# Patient Record
Sex: Female | Born: 1970 | Race: White | Hispanic: No | Marital: Single | Smoking: Former smoker
Health system: Southern US, Community
[De-identification: ages and names within clinical notes are randomized; demographics above are authoritative.]

## PROBLEM LIST (undated history)

## (undated) DIAGNOSIS — K219 Gastro-esophageal reflux disease without esophagitis: Secondary | ICD-10-CM

## (undated) DIAGNOSIS — G4730 Sleep apnea, unspecified: Secondary | ICD-10-CM

## (undated) DIAGNOSIS — F419 Anxiety disorder, unspecified: Secondary | ICD-10-CM

## (undated) DIAGNOSIS — Z85820 Personal history of malignant melanoma of skin: Secondary | ICD-10-CM

## (undated) DIAGNOSIS — F329 Major depressive disorder, single episode, unspecified: Secondary | ICD-10-CM

## (undated) DIAGNOSIS — N390 Urinary tract infection, site not specified: Secondary | ICD-10-CM

## (undated) DIAGNOSIS — F32A Depression, unspecified: Secondary | ICD-10-CM

## (undated) DIAGNOSIS — E041 Nontoxic single thyroid nodule: Secondary | ICD-10-CM

## (undated) DIAGNOSIS — J069 Acute upper respiratory infection, unspecified: Secondary | ICD-10-CM

## (undated) DIAGNOSIS — IMO0002 Reserved for concepts with insufficient information to code with codable children: Secondary | ICD-10-CM

## (undated) DIAGNOSIS — B019 Varicella without complication: Secondary | ICD-10-CM

## (undated) HISTORY — DX: Major depressive disorder, single episode, unspecified: F32.9

## (undated) HISTORY — DX: Personal history of malignant melanoma of skin: Z85.820

## (undated) HISTORY — DX: Varicella without complication: B01.9

## (undated) HISTORY — PX: NASAL SINUS SURGERY: SHX719

## (undated) HISTORY — DX: Depression, unspecified: F32.A

## (undated) HISTORY — DX: Gastro-esophageal reflux disease without esophagitis: K21.9

## (undated) HISTORY — DX: Reserved for concepts with insufficient information to code with codable children: IMO0002

## (undated) HISTORY — DX: Anxiety disorder, unspecified: F41.9

## (undated) HISTORY — DX: Nontoxic single thyroid nodule: E04.1

## (undated) HISTORY — DX: Sleep apnea, unspecified: G47.30

## (undated) HISTORY — DX: Acute upper respiratory infection, unspecified: J06.9

## (undated) HISTORY — DX: Urinary tract infection, site not specified: N39.0

---

## 2000-09-03 ENCOUNTER — Other Ambulatory Visit: Admission: RE | Admit: 2000-09-03 | Discharge: 2000-09-03 | Payer: Self-pay | Admitting: Internal Medicine

## 2001-10-28 ENCOUNTER — Other Ambulatory Visit: Admission: RE | Admit: 2001-10-28 | Discharge: 2001-10-28 | Payer: Self-pay | Admitting: Internal Medicine

## 2002-12-25 ENCOUNTER — Other Ambulatory Visit: Admission: RE | Admit: 2002-12-25 | Discharge: 2002-12-25 | Payer: Self-pay | Admitting: Internal Medicine

## 2003-12-30 ENCOUNTER — Other Ambulatory Visit: Admission: RE | Admit: 2003-12-30 | Discharge: 2003-12-30 | Payer: Self-pay | Admitting: Gynecology

## 2004-03-13 ENCOUNTER — Other Ambulatory Visit: Admission: RE | Admit: 2004-03-13 | Discharge: 2004-03-13 | Payer: Self-pay | Admitting: Gynecology

## 2004-08-28 ENCOUNTER — Other Ambulatory Visit: Admission: RE | Admit: 2004-08-28 | Discharge: 2004-08-28 | Payer: Self-pay | Admitting: Gynecology

## 2005-02-05 ENCOUNTER — Ambulatory Visit: Payer: Self-pay | Admitting: Internal Medicine

## 2005-03-14 ENCOUNTER — Other Ambulatory Visit: Admission: RE | Admit: 2005-03-14 | Discharge: 2005-03-14 | Payer: Self-pay | Admitting: Gynecology

## 2005-05-09 ENCOUNTER — Ambulatory Visit: Payer: Self-pay | Admitting: Cardiology

## 2005-09-18 ENCOUNTER — Ambulatory Visit: Payer: Self-pay | Admitting: Family Medicine

## 2005-09-24 HISTORY — PX: BREAST ENHANCEMENT SURGERY: SHX7

## 2006-04-01 ENCOUNTER — Other Ambulatory Visit: Admission: RE | Admit: 2006-04-01 | Discharge: 2006-04-01 | Payer: Self-pay | Admitting: Gynecology

## 2006-09-02 ENCOUNTER — Other Ambulatory Visit: Admission: RE | Admit: 2006-09-02 | Discharge: 2006-09-02 | Payer: Self-pay | Admitting: Gynecology

## 2007-02-04 ENCOUNTER — Other Ambulatory Visit: Admission: RE | Admit: 2007-02-04 | Discharge: 2007-02-04 | Payer: Self-pay | Admitting: Gynecology

## 2007-04-21 DIAGNOSIS — K219 Gastro-esophageal reflux disease without esophagitis: Secondary | ICD-10-CM | POA: Insufficient documentation

## 2007-07-04 ENCOUNTER — Other Ambulatory Visit: Admission: RE | Admit: 2007-07-04 | Discharge: 2007-07-04 | Payer: Self-pay | Admitting: Gynecology

## 2009-07-22 ENCOUNTER — Encounter: Admission: RE | Admit: 2009-07-22 | Discharge: 2009-07-22 | Payer: Self-pay | Admitting: Chiropractor

## 2009-07-28 ENCOUNTER — Encounter: Admission: RE | Admit: 2009-07-28 | Discharge: 2009-07-28 | Payer: Self-pay | Admitting: Chiropractor

## 2009-10-02 ENCOUNTER — Ambulatory Visit (HOSPITAL_BASED_OUTPATIENT_CLINIC_OR_DEPARTMENT_OTHER): Admission: RE | Admit: 2009-10-02 | Discharge: 2009-10-02 | Payer: Self-pay | Admitting: Otolaryngology

## 2009-10-15 ENCOUNTER — Ambulatory Visit: Payer: Self-pay | Admitting: Internal Medicine

## 2010-04-17 ENCOUNTER — Encounter: Admission: RE | Admit: 2010-04-17 | Discharge: 2010-04-17 | Payer: Self-pay | Admitting: Chiropractor

## 2010-04-20 ENCOUNTER — Encounter: Admission: RE | Admit: 2010-04-20 | Discharge: 2010-04-20 | Payer: Self-pay | Admitting: Chiropractor

## 2010-05-12 ENCOUNTER — Emergency Department (HOSPITAL_COMMUNITY): Admission: EM | Admit: 2010-05-12 | Discharge: 2010-05-12 | Payer: Self-pay | Admitting: Emergency Medicine

## 2010-05-12 ENCOUNTER — Encounter: Payer: Self-pay | Admitting: Cardiology

## 2010-05-12 ENCOUNTER — Telehealth: Payer: Self-pay | Admitting: Cardiology

## 2010-10-24 NOTE — Progress Notes (Signed)
Summary: chest pain/sob  Phone Note Call from Patient   Caller: Patient Reason for Call: Talk to Nurse Summary of Call: pt having chest pain and sob-wanted to see dr Honora Searson today-told he's not here-wants a call [phone] Initial call taken by: Glynda Jaeger,  May 12, 2010 11:57 AM  Follow-up for Phone Call        SPOKE WITH PT CHEST PAIN HAS LASTED 1 HOUR WITH NO ACTIVITY PER PT HAD EPISODE LAST WEEK AS WELL SIMILIAR S/S INSTRUCTED PT TO GO  TO  ER.VERBALIZED UNDERSTANDING.  Follow-up by: Scherrie Bateman, LPN,  May 12, 2010 12:23 PM  Additional Follow-up for Phone Call Additional follow up Details #1::        agree Additional Follow-up by: Gaylord Shih, MD, [facility],  May 15, 2010 10:46 AM

## 2010-10-24 NOTE — Letter (Signed)
Summary: ER Notification  Architectural technologist, Main Office  [address]   [place], [place] [address]   Phone: [phone]  Fax: [phone]    May 12, 2010 12:18 PM  Ashley Allen  The above referenced patient has been advised to report directly to the Emergency Room. Please see below for more information:  Dx: CHEST PAIN X 1 HOUR  WITH NO ACTIVITY______     Private Vehicle  _____X__________ or EMS:  ________________   Orders:  Yes ______ or No  __X_____   Notify upon arrival:     Trish [phone]       Or _________________   Thank you,    [facility] Staff

## 2010-12-08 LAB — POCT CARDIAC MARKERS
CKMB, poc: <1 ng/mL — ABNORMAL LOW (ref 1.0–8.0)
CKMB, poc: <1 ng/mL — ABNORMAL LOW (ref 1.0–8.0)
Myoglobin, poc: 20.3 ng/mL (ref 12–200)
Myoglobin, poc: 24.8 ng/mL (ref 12–200)
Troponin i, poc: <0.05 ng/mL (ref 0.00–0.09)
Troponin i, poc: <0.05 ng/mL (ref 0.00–0.09)

## 2010-12-08 LAB — BASIC METABOLIC PANEL
BUN: 8 mg/dL (ref 6–23)
CO2: 26 mEq/L (ref 19–32)
Calcium: 9.4 mg/dL (ref 8.4–10.5)
Chloride: 106 mEq/L (ref 96–112)
Creatinine, Ser: 0.56 mg/dL (ref 0.4–1.2)
GFR calc Af Amer: >60 mL/min (ref >60)
GFR calc non Af Amer: >60 mL/min (ref >60)
Glucose, Bld: 142 mg/dL — ABNORMAL HIGH (ref 70–99)
Potassium: 3.6 mEq/L (ref 3.5–5.1)
Sodium: 140 mEq/L (ref 135–145)

## 2010-12-08 LAB — CBC
HCT: 42.4 % (ref 36.0–46.0)
Hemoglobin: 14.7 g/dL (ref 12.0–15.0)
MCH: 31.1 pg (ref 26.0–34.0)
MCHC: 34.7 g/dL (ref 30.0–36.0)
MCV: 89.6 fL (ref 78.0–100.0)
Platelets: 214 10*3/uL (ref 150–400)
RBC: 4.73 MIL/uL (ref 3.87–5.11)
RDW: 12.3 % (ref 11.5–15.5)
WBC: 7.2 10*3/uL (ref 4.0–10.5)

## 2010-12-08 LAB — DIFFERENTIAL
Basophils Absolute: 0 10*3/uL (ref 0.0–0.1)
Basophils Relative: 1 % (ref 0–1)
Eosinophils Absolute: 0.1 10*3/uL (ref 0.0–0.7)
Eosinophils Relative: 1 % (ref 0–5)
Lymphocytes Relative: 31 % (ref 12–46)
Lymphs Abs: 2.3 10*3/uL (ref 0.7–4.0)
Monocytes Absolute: 0.6 10*3/uL (ref 0.1–1.0)
Monocytes Relative: 8 % (ref 3–12)
Neutro Abs: 4.3 10*3/uL (ref 1.7–7.7)
Neutrophils Relative %: 59 % (ref 43–77)

## 2010-12-08 LAB — D-DIMER, QUANTITATIVE: D-Dimer, Quant: <0.22 ug/mL-FEU (ref 0.00–0.48)

## 2011-01-03 ENCOUNTER — Other Ambulatory Visit: Payer: Self-pay | Admitting: Dermatology

## 2011-01-27 ENCOUNTER — Emergency Department (HOSPITAL_COMMUNITY)
Admission: EM | Admit: 2011-01-27 | Discharge: 2011-01-27 | Disposition: A | Discharge status: Home / Self Care | Payer: No Typology Code available for payment source | Attending: Emergency Medicine | Admitting: Emergency Medicine

## 2011-01-27 DIAGNOSIS — M545 Low back pain, unspecified: Secondary | ICD-10-CM | POA: Insufficient documentation

## 2011-01-27 DIAGNOSIS — K219 Gastro-esophageal reflux disease without esophagitis: Secondary | ICD-10-CM | POA: Insufficient documentation

## 2011-01-27 DIAGNOSIS — S139XXA Sprain of joints and ligaments of unspecified parts of neck, initial encounter: Secondary | ICD-10-CM | POA: Insufficient documentation

## 2011-01-27 DIAGNOSIS — S335XXA Sprain of ligaments of lumbar spine, initial encounter: Secondary | ICD-10-CM | POA: Insufficient documentation

## 2011-01-27 DIAGNOSIS — M542 Cervicalgia: Secondary | ICD-10-CM | POA: Insufficient documentation

## 2011-10-19 ENCOUNTER — Other Ambulatory Visit: Payer: Self-pay | Admitting: Gynecology

## 2011-10-30 ENCOUNTER — Other Ambulatory Visit: Payer: Self-pay | Admitting: Gynecology

## 2011-10-30 DIAGNOSIS — Z1231 Encounter for screening mammogram for malignant neoplasm of breast: Secondary | ICD-10-CM

## 2011-11-20 ENCOUNTER — Ambulatory Visit
Admission: RE | Admit: 2011-11-20 | Discharge: 2011-11-20 | Disposition: A | Discharge status: Home / Self Care | Payer: BC Managed Care – PPO | Source: Ambulatory Visit | Attending: Gynecology | Admitting: Gynecology

## 2011-11-20 DIAGNOSIS — Z1231 Encounter for screening mammogram for malignant neoplasm of breast: Secondary | ICD-10-CM

## 2012-01-21 ENCOUNTER — Other Ambulatory Visit: Payer: Self-pay | Admitting: Dermatology

## 2012-07-03 ENCOUNTER — Encounter: Payer: Self-pay | Admitting: Family Medicine

## 2012-07-03 ENCOUNTER — Ambulatory Visit (INDEPENDENT_AMBULATORY_CARE_PROVIDER_SITE_OTHER): Payer: BC Managed Care – PPO | Admitting: Family Medicine

## 2012-07-03 VITALS — BP 118/98 | Temp 98.2°F | Ht 65.0 in | Wt 149.0 lb

## 2012-07-03 DIAGNOSIS — E049 Nontoxic goiter, unspecified: Secondary | ICD-10-CM

## 2012-07-03 DIAGNOSIS — Z23 Encounter for immunization: Secondary | ICD-10-CM

## 2012-07-03 DIAGNOSIS — R5381 Other malaise: Secondary | ICD-10-CM

## 2012-07-03 DIAGNOSIS — R5383 Other fatigue: Secondary | ICD-10-CM

## 2012-07-03 LAB — COMPREHENSIVE METABOLIC PANEL
ALT: 15 U/L (ref 0–35)
AST: 17 U/L (ref 0–37)
Albumin: 3.8 g/dL (ref 3.5–5.2)
Alkaline Phosphatase: 45 U/L (ref 39–117)
BUN: 8 mg/dL (ref 6–23)
CO2: 26 mEq/L (ref 19–32)
Calcium: 9 mg/dL (ref 8.4–10.5)
Chloride: 103 mEq/L (ref 96–112)
Creatinine, Ser: 0.6 mg/dL (ref 0.4–1.2)
GFR: 119.92 mL/min (ref >60.00)
Glucose, Bld: 87 mg/dL (ref 70–99)
Potassium: 3.5 mEq/L (ref 3.5–5.1)
Sodium: 135 mEq/L (ref 135–145)
Total Bilirubin: 0.7 mg/dL (ref 0.3–1.2)
Total Protein: 7.2 g/dL (ref 6.0–8.3)

## 2012-07-03 LAB — LIPID PANEL
Cholesterol: 170 mg/dL (ref 0–200)
HDL: 48 mg/dL (ref >39.00)
LDL Cholesterol: 98 mg/dL (ref 0–99)
Total CHOL/HDL Ratio: 4
Triglycerides: 120 mg/dL (ref 0.0–149.0)
VLDL: 24 mg/dL (ref 0.0–40.0)

## 2012-07-03 LAB — CBC WITH DIFFERENTIAL/PLATELET
Basophils Absolute: 0 10*3/uL (ref 0.0–0.1)
Basophils Relative: 0.5 % (ref 0.0–3.0)
Eosinophils Absolute: 0.1 10*3/uL (ref 0.0–0.7)
Eosinophils Relative: 1.2 % (ref 0.0–5.0)
HCT: 41.4 % (ref 36.0–46.0)
Hemoglobin: 13.8 g/dL (ref 12.0–15.0)
Lymphocytes Relative: 35.7 % (ref 12.0–46.0)
Lymphs Abs: 2.2 10*3/uL (ref 0.7–4.0)
MCHC: 33.3 g/dL (ref 30.0–36.0)
MCV: 92.1 fl (ref 78.0–100.0)
Monocytes Absolute: 0.4 10*3/uL (ref 0.1–1.0)
Monocytes Relative: 5.9 % (ref 3.0–12.0)
Neutro Abs: 3.4 10*3/uL (ref 1.4–7.7)
Neutrophils Relative %: 56.7 % (ref 43.0–77.0)
Platelets: 272 10*3/uL (ref 150.0–400.0)
RBC: 4.49 Mil/uL (ref 3.87–5.11)
RDW: 13 % (ref 11.5–14.6)
WBC: 6.1 10*3/uL (ref 4.5–10.5)

## 2012-07-03 LAB — TSH: TSH: 0.39 u[IU]/mL (ref 0.35–5.50)

## 2012-07-03 LAB — HEMOGLOBIN A1C: Hgb A1c MFr Bld: 5.1 % (ref 4.6–6.5)

## 2012-07-03 NOTE — Patient Instructions (Signed)
-  We have ordered labs or studies at this visit. It usually takes 1-2 weeks for results and processing. We will contact you with instructions IF your results are abnormal. Normal results will be released to your [facility] in 1-2 weeks. If you have not heard from [place] or can not find your results in [facility] in 2 weeks please contact our office.    Thank you for enrolling in MyChart. Please follow the instructions below to securely access your online medical record. MyChart allows you to send messages to your doctor, view your test results, renew your prescriptions, schedule appointments, and more.  How Do I Sign Up? 1. In your Internet browser, go to [email or web address] 2. Click on the New  User? link in the Sign In box.  3. Enter your MyChart Access Code exactly as it appears below. You will not need to use this code after you have completed the sign-up process. If you do not sign up before the expiration date, you must request a new code. MyChart Access Code: [email or web address] Expires: 08/02/2012  3:45 PM  4. Enter the last four digits of your Social Security Number (xxxx) and Date of Birth (mm/dd/yyyy) as indicated and click Next. You will be taken to the next sign-up page. 5. Create a MyChart ID. This will be your MyChart login ID and cannot be changed, so think of one that is secure and easy to remember. 6. Create a MyChart password. You can change your password at any time. 7. Enter your Password Reset Question and Answer and click Next. This can be used at a later time if you forget your password.  8. Select your communication preference, and if applicable enter your e-mail address. You will receive e-mail notification when new information is available in MyChart by choosing to receive e-mail notifications and filling in your e-mail. 9. Click Sign In. You can now view your medical record.   Additional Information If you have questions, you can email [email or web address] or call [phone] to talk to our MyChart staff. Remember, MyChart is NOT to be used for urgent needs. For medical emergencies, dial 911.

## 2012-07-03 NOTE — Progress Notes (Addendum)
Chief Complaint  Patient presents with  . Establish Care    HPI:  Here to establish care and she is concerned she could have thyroid problems. For the last several months she feels like she has gained some weight, feels cold sometimes, and feels tired. Has FH of thyroid disease and a history of a goiter. She reports she saw an endocrinologist in the past and was on synthroid even though her thyroid levels were normal. She reports she stopped taking synthroid per her gyn doctor's recommendations. She would like to check labs today and then maybe see an endocrinologist here.  Denies: CP, SOB, skin changes, nausea, vomiting, diarrhea, palpitations, swelling  - has always had a little constipation on and off - normally has 2-3 bowel movements per week.  -Had an IUD and has been on birth control and since. Has some bleeding every month regularly - but variable in terms of flow. -has anxiety, was on SSRI in the past, but did not want to be on medications so stopped taking this  Per patient: -vaccines: wants flu vaccines, UTD on tetanus  Per review of chart: -pap 09/2011 normal -mammogram 10/2011 with implants, bi-rads 1 ROS: See pertinent positives and negatives per HPI.  Past Medical History  Diagnosis Date  . GERD (gastroesophageal reflux disease)   . Anxiety     No family history on file.  History   Social History  . Marital Status: Single    Spouse Name: N/A    Number of Children: N/A  . Years of Education: N/A   Social History Main Topics  . Smoking status: Former Games developer  . Smokeless tobacco: None   Comment: previous social smoker   . Alcohol Use: Yes  . Drug Use: None  . Sexually Active: None   Other Topics Concern  . None   Social History Narrative  . None    Current outpatient prescriptions:fluticasone (FLONASE) 50 MCG/ACT nasal spray, Place 2 sprays into the nose daily. , Disp: , Rfl: ;  LEVORA 0.15/30, 28, 0.15-30 MG-MCG tablet, Take 1 tablet by mouth daily. ,  Disp: , Rfl: ;  omeprazole (PRILOSEC) 40 MG capsule, Take 40 mg by mouth daily. , Disp: , Rfl: ;  valACYclovir (VALTREX) 1000 MG tablet, Take 1,000 mg by mouth daily. , Disp: , Rfl:   EXAM:  Filed Vitals:   07/03/12 1511  BP: 118/98  Temp: 98.2 F (36.8 C)    Body mass index is 24.79 kg/(m^2).  GENERAL: vitals reviewed and listed above, alert, oriented, appears well hydrated and in no acute distress  HEENT: atraumatic, conjunttiva clear, no obvious abnormalities on inspection of external nose and ears  NECK: no obvious masses on inspection  LUNGS: clear to auscultation bilaterally, no wheezes, rales or rhonchi, good air movement  CV: HRRR, no peripheral edema  MS: moves all extremities without noticeable abnormality  PSYCH: pleasant and cooperative, no obvious depression or anxiety  ASSESSMENT AND PLAN:  Discussed the following assessment and plan:  1. Fatigue  CBC with Differential, CMP, Lipid Panel, Hemoglobin A1c, Vitamin D, 25-hydroxy  2. Thyroid goiter  TSH   -reviewed PMH, FH, SH, Meds, All -many etiologies for fatigue - will check labs per orders and follow up in 1 month to discuss -advised counseling for anxiety   -flu today -Patient advised to return or notify a doctor immediately if symptoms worsen or persist or new concerns arise.  Patient Instructions  -We have ordered labs or studies at this visit. It usually  takes 1-2 weeks for results and processing. We will contact you with instructions IF your results are abnormal. Normal results will be released to your [facility] in 1-2 weeks. If you have not heard from [place] or can not find your results in [facility] in 2 weeks please contact our office.    Thank you for enrolling in MyChart. Please follow the instructions below to securely access your online medical record. MyChart allows you to send messages to your doctor, view your test results, renew your prescriptions, schedule appointments, and more.  How Do I Sign  Up? 1. In your Internet browser, go to [email or web address] 2. Click on the New  User? link in the Sign In box.  3. Enter your MyChart Access Code exactly as it appears below. You will not need to use this code after you have completed the sign-up process. If you do not sign up before the expiration date, you must request a new code. MyChart Access Code: [email or web address] Expires: 08/02/2012  3:45 PM  4. Enter the last four digits of your Social Security Number (xxxx) and Date of Birth (mm/dd/yyyy) as indicated and click Next. You will be taken to the next sign-up page. 5. Create a MyChart ID. This will be your MyChart login ID and cannot be changed, so think of one that is secure and easy to remember. 6. Create a MyChart password. You can change your password at any time. 7. Enter your Password Reset Question and Answer and click Next. This can be used at a later time if you forget your password.  8. Select your communication preference, and if applicable enter your e-mail address. You will receive e-mail notification when new information is available in MyChart by choosing to receive e-mail notifications and filling in your e-mail. 9. Click Sign In. You can now view your medical record.   Additional Information If you have questions, you can email [email or web address] or call [phone] to talk to our MyChart staff. Remember, MyChart is NOT to be used for urgent needs. For medical emergencies, dial 911.       Kriste Basque R.

## 2012-07-04 LAB — VITAMIN D 25 HYDROXY (VIT D DEFICIENCY, FRACTURES): Vit D, 25-Hydroxy: 50 ng/mL (ref 30–89)

## 2012-07-04 NOTE — Progress Notes (Signed)
Quick Note:  Left a message for return call. ______ 

## 2012-07-04 NOTE — Progress Notes (Signed)
Quick Note:  Called and spoke with pt and pt is aware. ______ 

## 2012-08-11 ENCOUNTER — Encounter: Payer: BC Managed Care – PPO | Admitting: Family Medicine

## 2012-08-11 NOTE — Progress Notes (Signed)
Pt no showed appt.  This encounter was created in error - please disregard.

## 2012-08-18 ENCOUNTER — Ambulatory Visit (INDEPENDENT_AMBULATORY_CARE_PROVIDER_SITE_OTHER): Payer: BC Managed Care – PPO | Admitting: Family Medicine

## 2012-08-18 ENCOUNTER — Encounter: Payer: Self-pay | Admitting: Family Medicine

## 2012-08-18 VITALS — BP 120/84 | HR 88 | Temp 98.4°F | Wt 147.0 lb

## 2012-08-18 DIAGNOSIS — F32A Depression, unspecified: Secondary | ICD-10-CM

## 2012-08-18 DIAGNOSIS — E039 Hypothyroidism, unspecified: Secondary | ICD-10-CM

## 2012-08-18 DIAGNOSIS — F329 Major depressive disorder, single episode, unspecified: Secondary | ICD-10-CM

## 2012-08-18 DIAGNOSIS — F3289 Other specified depressive episodes: Secondary | ICD-10-CM

## 2012-08-18 DIAGNOSIS — E041 Nontoxic single thyroid nodule: Secondary | ICD-10-CM

## 2012-08-18 MED ORDER — SERTRALINE HCL 50 MG PO TABS: 50.0000 mg | ORAL_TABLET | Freq: Every day | ORAL | Status: DC

## 2012-08-18 NOTE — Progress Notes (Signed)
Chief Complaint  Patient presents with  . 1 month follow up    HPI:  Follow up:  First seen a month ago and labs drawn for long hx of many vague complaints - fatigue, weight gain, occ constipation, cold sometimes at last appointment - hx of ? thyroid disease with normal thyroid levels, and ? Goiter, but had stopped taking synthroid - labs including TSH (on low end), lipids, CMP, CBC with diff, Hemoglobin A1c and vitamin D looked good -Denies: fevers, weight loss, GI symptoms other then mild occ constipation, GU symptoms, CP, SOB, NVD, UTD on health maintenance -she saw an endocrinologist prior to move -reports restarted zoloft at 50mg  daily - and feeling much better  ROS: See pertinent positives and negatives per HPI.  Past Medical History  Diagnosis Date  . GERD (gastroesophageal reflux disease)   . Anxiety   . Chicken pox   . Depression   . Ulcer   . UTI (urinary tract infection)     Family History  Problem Relation Age of Onset  . Alcohol abuse Father   . Arthritis Mother   . Arthritis Father   . Uterine cancer Other   . Colon cancer Other   . Hyperlipidemia Mother   . Heart disease Father   . Hypertension Mother   . Hypertension Father   . Sudden death Paternal Grandmother     History   Social History  . Marital Status: Single    Spouse Name: N/A    Number of Children: N/A  . Years of Education: N/A   Social History Main Topics  . Smoking status: Former Games developer  . Smokeless tobacco: None     Comment: previous social smoker   . Alcohol Use: Yes  . Drug Use: None  . Sexually Active: None   Other Topics Concern  . None   Social History Narrative  . None    Current outpatient prescriptions:fluticasone (FLONASE) 50 MCG/ACT nasal spray, Place 2 sprays into the nose daily. , Disp: , Rfl: ;  LEVORA 0.15/30, 28, 0.15-30 MG-MCG tablet, Take 1 tablet by mouth daily. , Disp: , Rfl: ;  omeprazole (PRILOSEC) 40 MG capsule, Take 40 mg by mouth daily. , Disp: ,  Rfl: ;  sertraline (ZOLOFT) 50 MG tablet, Take 1 tablet (50 mg total) by mouth daily., Disp: 90 tablet, Rfl: 3 valACYclovir (VALTREX) 1000 MG tablet, Take 1,000 mg by mouth daily. , Disp: , Rfl: ;  [DISCONTINUED] sertraline (ZOLOFT) 50 MG tablet, Take 50 mg by mouth daily. , Disp: , Rfl:   EXAM:  Filed Vitals:   08/18/12 1406  BP: 120/84  Pulse: 88  Temp: 98.4 F (36.9 C)    There is no height on file to calculate BMI.  GENERAL: vitals reviewed and listed above, alert, oriented, appears well hydrated and in no acute distress  HEENT: atraumatic, conjunttiva clear, no obvious abnormalities on inspection of external nose and ears  NECK: R thyroid fullness  LUNGS: clear to auscultation bilaterally, no wheezes, rales or rhonchi, good air movement  CV: HRRR, no peripheral edema  MS: moves all extremities without noticeable abnormality  PSYCH: pleasant and cooperative, no obvious depression or anxiety  ASSESSMENT AND PLAN:  Discussed the following assessment and plan:  1. Hypothyroid  Ambulatory referral to Endocrinology  2. Thyroid nodule  Ambulatory referral to Endocrinology  3. Depression  Ambulatory referral to Endocrinology, sertraline (ZOLOFT) 50 MG tablet   -Pt wonders if needs ot be on thyroid medication - on synthroid  in the past, but off for some time and TSH now in lower range. Discused options and pt would likeot see endocrinologist here for recs. She was followed by an endocrinologist prior to moving here who had recommended synthroid, then gyn here recommended she stop it. -discussed depression and symptoms improving on zoloft. Will stick to this dose per pt preference. Counseling recommended. Follow up in 2 months. -Patient advised to return or notify a doctor immediately if symptoms worsen or persist or new concerns arise.  There are no Patient Instructions on file for this visit.   Kriste Basque R.

## 2012-08-18 NOTE — Patient Instructions (Addendum)
-  continue zoloft  -Please see consider seeing your counselor

## 2012-08-25 ENCOUNTER — Ambulatory Visit: Payer: BC Managed Care – PPO | Admitting: Internal Medicine

## 2012-08-28 ENCOUNTER — Ambulatory Visit (INDEPENDENT_AMBULATORY_CARE_PROVIDER_SITE_OTHER): Payer: BC Managed Care – PPO | Admitting: Internal Medicine

## 2012-08-28 ENCOUNTER — Encounter: Payer: Self-pay | Admitting: Internal Medicine

## 2012-08-28 VITALS — BP 118/82 | HR 74 | Temp 98.4°F | Ht 65.0 in | Wt 149.0 lb

## 2012-08-28 DIAGNOSIS — R5381 Other malaise: Secondary | ICD-10-CM

## 2012-08-28 DIAGNOSIS — R5383 Other fatigue: Secondary | ICD-10-CM

## 2012-08-28 DIAGNOSIS — E039 Hypothyroidism, unspecified: Secondary | ICD-10-CM

## 2012-08-28 NOTE — Progress Notes (Signed)
Subjective:     Patient ID: Ashley Allen, female   DOB: 10/16/1970, 41 y.o.   MRN: [id number]  HPI Ms. Gossett is a 41 y/o woman referred by her PCP, Dr. Kriste Basque, for management of ? hypothyroidism, with recent normal TSH (0.39 on 07/03/2012 - LLN 0.35) despite being off Synthroid.  Pt tells me that she was started on Synthroid by her previous endocrinologist in 2008, for goiter, but she does not remember having blood work or thyroid U/S done, so it is unclear whether she had hypothyroidism or a thyroid nodule was palpated then. She took the medication for about 6 months then, then stopped the medication 5 years ago per [employer]. She did not feel different after starting or after stopping the medication. Of note, pt's mother and sister has Graves ds. She is wondering if she might develop it, too.   At her last visit with her PCP, in 08/18/2012, she had multiple vague complaints: fatigue, wt gain, constipation, feeling cold. She was wondering whether she needs to restart Synthroid and was referred to endocrinology.  She c/o fatigue accentuated in the last 6 months >> can sleep all day if she could. She asks me whether this can be related to possible celiac disease. She did not try a gluten-free diet to see if her fatigue improves (no h/o anemia, wt loss, diarrhea, rash). She also has constipation (1 BM per week, which is not new). She gained ~10 lbs in last year. She exercises by walking her dog 4 miles a day. She has more headaches than before, this week she had a headache every day. Menstrual cycles occur monthly. She had a panic attack in June 2011, none afterwards since starting Zoloft.   I reviewed her chart and she has a PMH of: - GERD, on PPI - anxiety - h/o basal cell cancer - she also was previously dx with OSA and started on CPAP, which she could not tolerate and stopped wearing it after 6 months. The snoring improved after she had sinus surgery, but she did not have a repeat Sleep study to  reevaluate the OSA after her surgery.  Her latest labs (07/03/2012) show: normal lipids, HbA1c 5.1%, vit D 50, CBC with diff normal, CMP normal.  Past Medical History  Diagnosis Date  . GERD (gastroesophageal reflux disease)   . Anxiety   . Chicken pox   . Depression   . Ulcer   . UTI (urinary tract infection)    Past Surgical History  Procedure Date  . Breast enhancement surgery 2007  . Nasal sinus surgery ?2010   History   Social History  . Marital Status: Single    Spouse Name: N/A    Number of Children: 0  . Years of Education: 25, college   Occupational History  . Sales [employer]   Social History Main Topics  . Smoking status: Former Games developer  . Smokeless tobacco: Not on file     Comment: previous social smoker   . Alcohol Use: Yes  . Drug Use: No  . Sexually Active: Not on file   Other Topics Concern  . Not on file   Social History Narrative   Regular exercise-yesCaffeine Use-yes    Current Outpatient Prescriptions on File Prior to Visit  Medication Sig Dispense Refill  . fluticasone (FLONASE) 50 MCG/ACT nasal spray Place 2 sprays into the nose daily.       Marland Kitchen LEVORA 0.15/30, 28, 0.15-30 MG-MCG tablet Take 1 tablet by mouth daily.       Marland Kitchen  sertraline (ZOLOFT) 50 MG tablet Take 1 tablet (50 mg total) by mouth daily.  90 tablet  3  . omeprazole (PRILOSEC) 40 MG capsule Take 40 mg by mouth daily as needed.       . valACYclovir (VALTREX) 1000 MG tablet Take 1,000 mg by mouth as needed.        Allergies  Allergen Reactions  . Polysporin (Bacitracin-Polymyxin B)   . Sulfonamide Derivatives     REACTION: Itching   Family History  Problem Relation Age of Onset  . Alcohol abuse Father   . Arthritis Mother   . Arthritis Father   . Uterine cancer Other   . Colon cancer Other   . Hyperlipidemia Mother   . Heart disease Father   . Hypertension Mother   . Hypertension Father   . Sudden death Paternal Grandmother    Review of Systems per  HPI+ Constitutional: has subjective hypothermia Eyes: no blurry vision, no xerophthalmia ENT: no sore throat, no nodules palpated in throat, no dysphagia/odynophagia, no hoarseness Cardiovascular: no CP/SOB/palpitations/leg swelling Respiratory: no cough/SOB Gastrointestinal: no N/V/D/C Musculoskeletal: no muscle/joint aches Skin: no rashes Neurological: no tremors/numbness/tingling/dizziness Psychiatric: some depression/anxiety  Objective:   Physical Exam BP 118/82  Pulse 74  Temp 98.4 F (36.9 C) (Oral)  Ht 5\' 5"  (1.651 m)  Wt 149 lb (67.586 kg)  BMI 24.79 kg/m2  SpO2 96%  LMP 08/05/2012 Constitutional: overweight, in NAD Eyes: PERRLA, EOMI, no exophthalmos ENT: moist mucous membranes, thyroid lobe R larger than the L, no clear nodule palpated, no cervical lymphadenopathy Cardiovascular: RRR, No MRG Respiratory: CTA B Gastrointestinal: abdomen soft, NT, ND, BS+ Musculoskeletal: no deformities, strength intact in all 4 Skin: moist, warm, no rashes Neurological: no tremor with outstretched hands, DTR normal in all 4     Assessment:     1. ? Hypothyroidism 2. Fatigue - vit D 50 - no anemia - no DM/prediabetes - good exercise level - OSA, not compliant with CPAP (cannot tolerate mask)    Plan:     1. ? Hypothyroidism - pt. has a h/o taking thyroid hormones likely to decrease the size of her thyroid as she remember being told by her endocrinologist that she has a goiter. I do not have records to see if she actually had hypothyroidism or a nodule at that time - she appears to be euthyroid per latest TSH, however this is on the low side - since both her mother and sister had Graves disease, she is definitely at risk for either Graves ds. or Hashimoto's thyroiditis or other autoimmune ds., for e.g. celiac disease - I do not feel a definite nodule on thyroid exam, but R lobe is a little larger than the left. I offered to get a thyroid U/S at this time, but pt is worried that  she will need to pay 40% of it and we decided to wait until next visit and reevaluate to see if there is growth or if she develops neck compression spx - for now, I will check TSH along with a free T3 and free T4, but also TSI and anti TPO Abs. If she has any of the 2, we will need to be more vigilant in rechecking her thyroid tests. Also, if she has thyroid autoimmunity, we will also need to be on the watch for any signs of adrenal insufficiency or other autoimmune ds. Also, even with normal tests, the titer of antibodies can vary and affect her hormone levels and spx in a transient  manner.  2. Fatigue - we discussed some possible causes: - vit D def - her vit D was normal - anemia - Hb was normal - celiac disease - we will check her for this by anti TTG antibody levels, but I explained that if these are negative, this does not mean that she does not have the ds and would still try a gluten-free diet to see how she feels on it - OSA - I believe this plays a large role in her fatigue, especially since it appears early in am. I advised her to check with Dr. Selena Batten if she needs to be reevaluated for this, and if she still needs CPAP, maybe to use nasal prongs or another device to help her tolerate it better - hypothyroidism - her TSH is low normal. If fT4 and fT3 are low, we will evaluate her for central hypothyroidism - diet - we discussed about how to make better choices in her diet to improve her energy level   Office Visit on 08/28/2012  Component Date Value Range Status  . TSH 08/28/2012 0.921  0.350 - 4.500 uIU/mL Final  . T3, Free 08/28/2012 2.8  2.3 - 4.2 pg/mL Final  . Free T4 08/28/2012 1.25  0.80 - 1.80 ng/dL Final  . TSI 86/57/8469 75  <140 % baseline Final  . Thyroid Peroxidase Antibody 08/28/2012 26.3  <35.0 IU/mL Final   Comment:                            The thyroid microsomal antigen has been shown to be Thyroid                          Peroxidase (TPO).  This assay detects anti-TPO  antibodies.  . Tissue Transglutaminase Ab, IgA 08/28/2012 4.6  <20 U/mL Final   Comment:    Reference Range:                                  <20     Negative                                  20-25   Equivocal                                  >25     Positive                                                     Between 2-3% of Celiac patients have selective IgA deficiency. If the                          tTG IgA result is negative but Celiac disease is still suspected,                          total IgA should be measured to identify possible selective IgA  deficiency and to rule out a false negative. In cases of IgA                          deficiency, measurement of tTG IgG should be considered.                             . Tissue Transglut Ab 08/28/2012 5.3  <20 U/mL Final   Comment:    Reference Range:                                  <20     Negative                                  20-25   Equivocal                                  >25     Positive                              Message sent to pt:  Dear Ms. Brien, Your TSI titer is finally back, I was waiting for it to return before I release your labs to MyChart. As you see, your thyroid hormone tests are normal, I do not think we need to recheck them more frequently than once a year. You can stay off Synthroid. Your thyroid peroxidase (TPO) antibody titer is not very high, but you do have some of these antibodies around, and it is possible that in periods of stress they can cause you to feel fatigued. The TSI antibodies are not significantly high, so no sign of Graves Disease. Regarding your celiac antibodies, these are normal, but, as we discussed, they can be normal and this does not completely rule out celiac disease. I would still try a gluten-free diet for at least 1-2 months to see how you feel. Please let me know if you have any questions. Sincerely, Carlus Pavlov MD

## 2012-08-28 NOTE — Patient Instructions (Addendum)
Please return in a year. Please discuss with Dr. Selena Batten about reevaluation for sleep apnea.

## 2012-08-29 LAB — TSH: TSH: 0.921 u[IU]/mL (ref 0.350–4.500)

## 2012-08-29 LAB — THYROID PEROXIDASE ANTIBODY: Thyroperoxidase Ab SerPl-aCnc: 26.3 IU/mL (ref <35.0)

## 2012-08-29 LAB — T4, FREE: Free T4: 1.25 ng/dL (ref 0.80–1.80)

## 2012-08-29 LAB — TISSUE TRANSGLUTAMINASE, IGG: Tissue Transglut Ab: 5.3 U/mL (ref <20)

## 2012-08-29 LAB — TISSUE TRANSGLUTAMINASE, IGA: Tissue Transglutaminase Ab, IgA: 4.6 U/mL (ref <20)

## 2012-08-29 LAB — T3, FREE: T3, Free: 2.8 pg/mL (ref 2.3–4.2)

## 2012-09-02 LAB — THYROID STIMULATING IMMUNOGLOBULIN: TSI: 75 % baseline (ref <140)

## 2012-10-01 ENCOUNTER — Ambulatory Visit (INDEPENDENT_AMBULATORY_CARE_PROVIDER_SITE_OTHER): Payer: BC Managed Care – PPO | Admitting: Family Medicine

## 2012-10-01 ENCOUNTER — Encounter: Payer: Self-pay | Admitting: Family Medicine

## 2012-10-01 VITALS — BP 130/88 | HR 98 | Temp 97.3°F | Wt 151.0 lb

## 2012-10-01 DIAGNOSIS — J069 Acute upper respiratory infection, unspecified: Secondary | ICD-10-CM

## 2012-10-01 DIAGNOSIS — R5381 Other malaise: Secondary | ICD-10-CM

## 2012-10-01 DIAGNOSIS — F32A Depression, unspecified: Secondary | ICD-10-CM

## 2012-10-01 DIAGNOSIS — G4733 Obstructive sleep apnea (adult) (pediatric): Secondary | ICD-10-CM | POA: Insufficient documentation

## 2012-10-01 DIAGNOSIS — F339 Major depressive disorder, recurrent, unspecified: Secondary | ICD-10-CM | POA: Insufficient documentation

## 2012-10-01 DIAGNOSIS — R5383 Other fatigue: Secondary | ICD-10-CM | POA: Insufficient documentation

## 2012-10-01 DIAGNOSIS — F329 Major depressive disorder, single episode, unspecified: Secondary | ICD-10-CM

## 2012-10-01 DIAGNOSIS — F3289 Other specified depressive episodes: Secondary | ICD-10-CM

## 2012-10-01 MED ORDER — GUAIFENESIN-CODEINE 100-10 MG/5ML PO SYRP: 5.0000 mL | ORAL_SOLUTION | Freq: Every evening | ORAL | Status: DC | PRN

## 2012-10-01 MED ORDER — BENZONATATE 100 MG PO CAPS: 100.0000 mg | ORAL_CAPSULE | Freq: Three times a day (TID) | ORAL | Status: DC | PRN

## 2012-10-01 MED ORDER — ALBUTEROL SULFATE HFA 108 (90 BASE) MCG/ACT IN AERS: 2.0000 | INHALATION_SPRAY | Freq: Four times a day (QID) | RESPIRATORY_TRACT | Status: DC | PRN

## 2012-10-01 MED ORDER — SERTRALINE HCL 50 MG PO TABS: 75.0000 mg | ORAL_TABLET | Freq: Every day | ORAL | Status: DC

## 2012-10-01 NOTE — Patient Instructions (Signed)
FOR YOUR COUGH: -use albuterol prn, cough medications, humidifier at night - if no improvement by next week start antibiotic  -We placed a referral for you as discussed to Pulmonology to discuss your sleep apnea and retest. It usually takes about 1-2 weeks to process and schedule this referral. If you have not heard from [place] regarding this appointment in 2 weeks please contact our office.

## 2012-10-01 NOTE — Progress Notes (Signed)
No chief complaint on file.   HPI: Acute visit for sinus congestion: -started: 1 week -symptoms:nasal congestion, sore throat, cough, SOB mild -denies:fever, SOB, NVD, tooth pain, strep or mono exposure, body aches -has tried: delsum, OTC meds, tylenol -sick contacts: "everybody", friend had the flu -Hx of: no allergies or asthma  Depression/Fatigue: -saw endo - extensive work up - notes and labs reviewed, no thyroid med recommended -pt with hx OSA, does have snoring and daytime somnolence - reports sleep study 3 or more years ago and mild OSA per her reports - did not tolerate CPAP. Would like to retests and see specialist to learn about options for tx if needed. -zoloft working initially, but not as well now. Poor sleep, depressed mood. No SI. Ha snot had counseling.   ROS: See pertinent positives and negatives per HPI.  Past Medical History  Diagnosis Date  . GERD (gastroesophageal reflux disease)   . Anxiety   . Chicken pox   . Depression   . Ulcer   . UTI (urinary tract infection)     Family History  Problem Relation Age of Onset  . Alcohol abuse Father   . Arthritis Mother   . Arthritis Father   . Uterine cancer Other   . Colon cancer Other   . Hyperlipidemia Mother   . Heart disease Father   . Hypertension Mother   . Hypertension Father   . Sudden death Paternal Grandmother     History   Social History  . Marital Status: Single    Spouse Name: N/A    Number of Children: 0  . Years of Education: 16   Occupational History  . Sales [employer]   Social History Main Topics  . Smoking status: Former Games developer  . Smokeless tobacco: None     Comment: previous social smoker   . Alcohol Use: Yes  . Drug Use: No  . Sexually Active: None   Other Topics Concern  . None   Social History Narrative   Regular exercise-yesCaffeine Use-yes    Current outpatient prescriptions:fluticasone (FLONASE) 50 MCG/ACT nasal spray, Place 2 sprays into the nose daily. ,  Disp: , Rfl: ;  LEVORA 0.15/30, 28, 0.15-30 MG-MCG tablet, Take 1 tablet by mouth daily. , Disp: , Rfl: ;  omeprazole (PRILOSEC) 40 MG capsule, Take 40 mg by mouth daily as needed. , Disp: , Rfl: ;  sertraline (ZOLOFT) 50 MG tablet, Take 1 tablet (50 mg total) by mouth daily., Disp: 90 tablet, Rfl: 3 valACYclovir (VALTREX) 1000 MG tablet, Take 1,000 mg by mouth as needed. , Disp: , Rfl: ;  albuterol (PROVENTIL HFA;VENTOLIN HFA) 108 (90 BASE) MCG/ACT inhaler, Inhale 2 puffs into the lungs every 6 (six) hours as needed for wheezing., Disp: 1 Inhaler, Rfl: 0;  benzonatate (TESSALON PERLES) 100 MG capsule, Take 1 capsule (100 mg total) by mouth 3 (three) times daily as needed for cough., Disp: 20 capsule, Rfl: 0 guaiFENesin-codeine (ROBITUSSIN AC) 100-10 MG/5ML syrup, Take 5 mLs by mouth at bedtime as needed for cough., Disp: 120 mL, Rfl: 0  EXAM:  Filed Vitals:   10/01/12 1029  BP: 130/88  Pulse: 98  Temp: 97.3 F (36.3 C)    There is no height on file to calculate BMI.  GENERAL: vitals reviewed and listed above, alert, oriented, appears well hydrated and in no acute distress  HEENT: atraumatic, conjunttiva clear, no obvious abnormalities on inspection of external nose and ears, normal appearance of ear canals and TMs, clear nasal congestion,  mild post oropharyngeal erythema with PND, no tonsillar edema or exudate, no sinus TTP  NECK: no obvious masses on inspection  LUNGS: clear to auscultation bilaterally, no wheezes, rales or rhonchi, good air movement  CV: HRRR, no peripheral edema  MS: moves all extremities without noticeable abnormality  PSYCH: pleasant and cooperative, no obvious depression or anxiety  ASSESSMENT AND PLAN:  Discussed the following assessment and plan:  1. Upper respiratory infection  Likely viral, lungs clear Supportive care, alb incase mild RAD - abx if not improving by next week - discussed risks of tx. Return precuations.Marland Kitchen albuterol (PROVENTIL HFA;VENTOLIN  HFA) 108 (90 BASE) MCG/ACT inhaler, benzonatate (TESSALON PERLES) 100 MG capsule, guaiFENesin-codeine (ROBITUSSIN AC) 100-10 MG/5ML syrup  2. OSA (obstructive sleep apnea)  Ambulatory referral to Pulmonology per pt request.  3. Depression  Advised counseling and increased zoloft to 75mg  daily - follow up in 1 month  4. Fatigue  Sleep test and tx depression.   -Patient advised to return or notify a doctor immediately if symptoms worsen or persist or new concerns arise.  Patient Instructions  FOR YOUR COUGH: -use albuterol prn, cough medications, humidifier at night - if no improvement by next week start antibiotic  -We placed a referral for you as discussed to Pulmonology to discuss your sleep apnea and retest. It usually takes about 1-2 weeks to process and schedule this referral. If you have not heard from [place] regarding this appointment in 2 weeks please contact our office.      Kriste Basque R.

## 2012-10-03 ENCOUNTER — Telehealth: Payer: Self-pay | Admitting: Family Medicine

## 2012-10-03 NOTE — Telephone Encounter (Signed)
Patient Information:  Caller Name: Derry  Phone: [phone]  Patient: Ashley Allen, Ashley Allen  Gender: Female  DOB: [birth year]  Age: 42 Years  PCP: Kriste Basque [facility])  Pregnant: No  Office Follow Up:  Does the office need to follow up with this patient?: No  Instructions For The Office: N/A  RN Note:  pt reports her pain is 5/10; Ibuprofen is not helping. RN advised patient to try Excedrin Migraine to see if that helped her headache.  Rn also advised lots of warm showers and warm fluids  Symptoms  Reason For Call & Symptoms: headache.  Pt states that she was seen in the office on 10/01/12,  Reviewed Health History In EMR: Yes  Reviewed Medications In EMR: Yes  Reviewed Allergies In EMR: Yes  Reviewed Surgeries / Procedures: Yes  Date of Onset of Symptoms: 09/26/2012  Treatments Tried: mucinex DM  Treatments Tried Worked: No OB / GYN:  LMP: 10/03/2012  Guideline(s) Used:  Headache  Disposition Per Guideline:   Home Care  Reason For Disposition Reached:   Mild-moderate headache  Advice Given:  Rest:   Lie down in a dark, quiet place and try to relax. Close your eyes and imagine your entire body relaxing.  Apply Cold to the Area:   Apply a cold wet washcloth or cold pack to the forehead for 20 minutes.  Call Back If:  Headache lasts longer than 24 hours  You become worse.  Pain Medicines:  For pain relief, you can take either acetaminophen, ibuprofen, or naproxen.  They are over-the-counter (OTC) pain drugs. You can buy them at the drugstore.

## 2012-10-06 ENCOUNTER — Other Ambulatory Visit: Payer: Self-pay | Admitting: Dermatology

## 2012-10-15 ENCOUNTER — Encounter: Payer: Self-pay | Admitting: Pulmonary Disease

## 2012-10-15 ENCOUNTER — Ambulatory Visit (INDEPENDENT_AMBULATORY_CARE_PROVIDER_SITE_OTHER): Payer: BC Managed Care – PPO | Admitting: Pulmonary Disease

## 2012-10-15 VITALS — BP 108/72 | HR 84 | Temp 98.4°F | Ht 65.0 in | Wt 148.4 lb

## 2012-10-15 DIAGNOSIS — G4733 Obstructive sleep apnea (adult) (pediatric): Secondary | ICD-10-CM

## 2012-10-15 NOTE — Progress Notes (Signed)
  Subjective:    Patient ID: Ashley Allen, female    DOB: 04-07-1971, 42 y.o.   MRN: [id number]  HPI The patient is a 42 year old female who I been asked to see for management of obstructive sleep apnea.  She was diagnosed in 2011 with mild OSA, with an AHI of 7 events per hour.  She was tried on CPAP, however could not tolerate because of having a large mask on her face.  She currently is continuing to have significant snoring, and has been noted to have an abnormal breathing pattern during sleep.  She has some awakenings during the night, and does not feel rested in the mornings upon arising.  She notes definite sleep pressured during the day with periods of an activity, and will often take a nap when able.  She also has sleepiness watching television or movies.  She denies any sleepiness while driving.  She has no symptoms suggestive of restless leg syndrome, as far she knows does not kick during the night.  The patient's weight is up 10 pounds over the last 2 years, and her Epworth score today is 10.  Sleep Questionnaire: What time do you typically go to bed?( Between what hours) 11p-12am How long does it take you to fall asleep? 33min-1hr How many times during the night do you wake up? 2 What time do you get out of bed to start your day? 0700 Do you drive or operate heavy machinery in your occupation? No How much has your weight changed (up or down) over the past two years? (In pounds) 10 lb (4.536 kg) Have you ever had a sleep study before? Yes If yes, location of study? [facility] If yes, date of study? 2001 Do you currently use CPAP? No Do you wear oxygen at any time? No    Review of Systems  Constitutional: Negative for fever and unexpected weight change.  HENT: Positive for congestion, rhinorrhea, sneezing, trouble swallowing, postnasal drip and sinus pressure. Negative for ear pain, nosebleeds, sore throat and dental problem.   Eyes: Negative for redness and itching.  Respiratory: Positive for  shortness of breath. Negative for cough, chest tightness and wheezing.   Cardiovascular: Negative for palpitations and leg swelling.  Gastrointestinal: Negative for nausea and vomiting.       Indigestion//Acid heartburn  Genitourinary: Negative for dysuria.  Musculoskeletal: Negative for joint swelling.  Skin: Negative for rash.  Neurological: Positive for headaches.  Hematological: Does not bruise/bleed easily.  Psychiatric/Behavioral: Positive for dysphoric mood. The patient is nervous/anxious.        Objective:   Physical Exam Constitutional:  Well developed, no acute distress  HENT:  Nares patent without discharge, but mild deviation to the right  Oropharynx without exudate, uvula is normal, palate elongated.   Eyes:  Perrla, eomi, no scleral icterus  Neck:  No JVD, no TMG  Cardiovascular:  Normal rate, regular rhythm, no rubs or gallops.  No murmurs        Intact distal pulses  Pulmonary :  Normal breath sounds, no stridor or respiratory distress   No rales, rhonchi, or wheezing  Abdominal:  Soft, nondistended, bowel sounds present.  No tenderness noted.   Musculoskeletal:  No lower extremity edema noted.  Lymph Nodes:  No cervical lymphadenopathy noted  Skin:  No cyanosis noted  Neurologic:  Alert, appropriate, moves all 4 extremities without obvious deficit.         Assessment & Plan:

## 2012-10-15 NOTE — Assessment & Plan Note (Signed)
The patient has been diagnosed with mild obstructive sleep apnea in the past, but could not tolerate CPAP because of the mask.  She is continued to have snoring, and abnormal breathing pattern during sleep, as well as nonrestorative sleep.  She also has sleep pressure during the day which interferes with her alertness and quality of life.  At this point, she is willing to try CPAP again with a different type of mask.  I also discussed with her the possibility of a dental appliance, as well as upper airway surgery.  She will talk to her orthodontist about a possible dental appliance, while we are working on c Pap trial

## 2012-10-15 NOTE — Patient Instructions (Addendum)
Will try cpap again with nasal pillows +/- chin strap Talk with your orthodontist about possibly trying a dental appliance for mild sleep apnea Work on modest weight loss.  followup with me in 6 weeks if you stay on cpap, but call if you are having tolerance issues so we can discuss/troubleshoot.

## 2012-10-17 ENCOUNTER — Ambulatory Visit (INDEPENDENT_AMBULATORY_CARE_PROVIDER_SITE_OTHER): Payer: BC Managed Care – PPO | Admitting: Licensed Clinical Social Worker

## 2012-10-17 DIAGNOSIS — F411 Generalized anxiety disorder: Secondary | ICD-10-CM

## 2012-10-17 DIAGNOSIS — F331 Major depressive disorder, recurrent, moderate: Secondary | ICD-10-CM

## 2012-10-28 ENCOUNTER — Ambulatory Visit (INDEPENDENT_AMBULATORY_CARE_PROVIDER_SITE_OTHER): Payer: BC Managed Care – PPO | Admitting: Licensed Clinical Social Worker

## 2012-10-28 DIAGNOSIS — F331 Major depressive disorder, recurrent, moderate: Secondary | ICD-10-CM

## 2012-10-28 DIAGNOSIS — F411 Generalized anxiety disorder: Secondary | ICD-10-CM

## 2012-10-30 ENCOUNTER — Ambulatory Visit (INDEPENDENT_AMBULATORY_CARE_PROVIDER_SITE_OTHER): Payer: BC Managed Care – PPO | Admitting: Family Medicine

## 2012-10-30 ENCOUNTER — Encounter: Payer: Self-pay | Admitting: Family Medicine

## 2012-10-30 VITALS — BP 132/88 | HR 80 | Temp 98.3°F | Wt 147.0 lb

## 2012-10-30 DIAGNOSIS — B009 Herpesviral infection, unspecified: Secondary | ICD-10-CM

## 2012-10-30 DIAGNOSIS — Z23 Encounter for immunization: Secondary | ICD-10-CM

## 2012-10-30 DIAGNOSIS — F3289 Other specified depressive episodes: Secondary | ICD-10-CM

## 2012-10-30 DIAGNOSIS — F32A Depression, unspecified: Secondary | ICD-10-CM

## 2012-10-30 DIAGNOSIS — B001 Herpesviral vesicular dermatitis: Secondary | ICD-10-CM

## 2012-10-30 DIAGNOSIS — Z309 Encounter for contraceptive management, unspecified: Secondary | ICD-10-CM

## 2012-10-30 DIAGNOSIS — F329 Major depressive disorder, single episode, unspecified: Secondary | ICD-10-CM

## 2012-10-30 DIAGNOSIS — IMO0001 Reserved for inherently not codable concepts without codable children: Secondary | ICD-10-CM

## 2012-10-30 DIAGNOSIS — G4733 Obstructive sleep apnea (adult) (pediatric): Secondary | ICD-10-CM

## 2012-10-30 MED ORDER — VALACYCLOVIR HCL 1 G PO TABS: 1000.0000 mg | ORAL_TABLET | ORAL | Status: DC | PRN

## 2012-10-30 MED ORDER — LEVONORGESTREL-ETHINYL ESTRAD 0.15-30 MG-MCG PO TABS: 1.0000 | ORAL_TABLET | Freq: Every day | ORAL | Status: DC

## 2012-10-30 MED ORDER — SERTRALINE HCL 25 MG PO TABS: ORAL_TABLET | ORAL | Status: DC

## 2012-10-30 NOTE — Patient Instructions (Addendum)
Contact your dentist about sleep apnea devices or consider this device: Contact The aveoTSD is distributed by [employer] in [place], [place], [place]. Hours 9AM to 4PM - Monday to Friday, PST. [employer] [address] [place] [address] [place] Phone: [phone]  Taper off the zoloft per instructions:  Start exercising on a regular basic  Continue counseling  Follow up in 3 months

## 2012-10-30 NOTE — Progress Notes (Signed)
Chief Complaint  Patient presents with  . 1 month follow up    HPI:  Here for Follow up/CPE:  1) Depression: fatigue, extensive labs looked good, has seen Dr. Lafe Garin in endo for her concern for subclinical thyroid dz and Dr. Shelle Iron in [facility] for ? OSA contributing  -zoloft help initially, increased dose last visit to 75mg , advised counseling and exercise -with increased zoloft she got a rash on trunk -not running  2)OSA:  -reviewed OV notes from Dr. Shelle Iron - recs appreciated -mild OSA, did not tolerate CPAP in the past - per review of notes is doing trial of CPAP with new mask and going to talk with dentist about dental appliance if this is not tolerated -she reports she does not want to use the CPAP, is going to check with dental devices  -Diet: variety of foods, balance and well rounded  -Taking folic acid: yes in multivitamin  -Exercise: no regular exercise  -Diabetes and Dyslipidemia Screening: normal 06/2012  -Hx of HTN: no  -Vaccines: UTD except tdap - wants this   -pap history: 1/13 normal  -FDLMP: 1 month ago  -sexual activity: yes, female partner, no new partners  -wants STI testing: no  -FH breast, colon or ovarian ca: see FH -mammogram: 10/2011 normal  -Alcohol, Tobacco, drug use: see social history  Review of Systems - see above  Past Medical History  Diagnosis Date  . GERD (gastroesophageal reflux disease)   . Anxiety   . Chicken pox   . Depression   . Ulcer   . UTI (urinary tract infection)     Family History  Problem Relation Age of Onset  . Alcohol abuse Father   . Arthritis Mother   . Arthritis Father   . Uterine cancer Other   . Colon cancer Other   . Hyperlipidemia Mother   . Heart disease Father   . Hypertension Mother   . Hypertension Father   . Sudden death Paternal Grandmother   . Cancer Other     Aunts  . Emphysema Father     heavy smoker    History   Social History  . Marital Status: Single    Spouse Name: N/A   Number of Children: 0  . Years of Education: 16   Occupational History  . [employer]   Social History Main Topics  . Smoking status: Former Smoker -- 3 years    Types: Cigarettes  . Smokeless tobacco: None     Comment: previous social smoker   . Alcohol Use: Yes  . Drug Use: No  . Sexually Active: None   Other Topics Concern  . None   Social History Narrative   Regular exercise-yesCaffeine Use-yes    Current outpatient prescriptions:fluticasone (FLONASE) 50 MCG/ACT nasal spray, Place 2 sprays into the nose daily. , Disp: , Rfl: ;  levonorgestrel-ethinyl estradiol (LEVORA 0.15/30, 28,) 0.15-30 MG-MCG tablet, Take 1 tablet by mouth daily., Disp: 1 Package, Rfl: 11;  Multiple Vitamin (MULTIVITAMIN) tablet, Take 1 tablet by mouth daily., Disp: , Rfl:  valACYclovir (VALTREX) 1000 MG tablet, Take 1 tablet (1,000 mg total) by mouth as needed., Disp: 10 tablet, Rfl: 0;  omeprazole (PRILOSEC) 40 MG capsule, Take 40 mg by mouth daily as needed. , Disp: , Rfl: ;  sertraline (ZOLOFT) 25 MG tablet, Take 50mg  daily for 1 week, then 25mg  daily for 1 week, then 25 mg every other day for 1 week then stop, Disp: 25 tablet, Rfl: 0  EXAM:  Filed  Vitals:   10/30/12 1611  BP: 132/88  Pulse: 80  Temp: 98.3 F (36.8 C)    GENERAL: vitals reviewed and listed below, alert, oriented, appears well hydrated and in no acute distress  HEENT: head atraumatic, PERRLA, normal appearance of eyes, ears, nose and mouth. moist mucus membranes.  NECK: supple, no masses or lymphadenopathy  LUNGS: clear to auscultation bilaterally, no rales, rhonchi or wheeze  CV: HRRR, no peripheral edema or cyanosis, normal pedal pulses  SKIN: erythematous papular rash on trunk  MS: normal gait, moves all extremities normally  PSYCH: normal affect, pleasant and cooperative  ASSESSMENT AND PLAN:  Discussed the following assessment and plan:  1. Depression  sertraline (ZOLOFT) 25 MG tablet  2. Recurrent cold  sores  valACYclovir (VALTREX) 1000 MG tablet  3. OSA (obstructive sleep apnea)    4. Contraception  levonorgestrel-ethinyl estradiol (LEVORA 0.15/30, 28,) 0.15-30 MG-MCG tablet    -healthy 42 yo Female with the following health issues:  -Discussed and advised all [place] preventive services health task force level A and B recommendations for age, sex and risks.  -advised taper of zoloft due to rash, continue counseling - discussed other options such as wellbutrin and TCA - she will consider, regular exercise, follow up in 3 months  -refilled OCP and Valtrex  -for OSA advised she talk with dentist and provided Rx and info for Aveo if she wants to try this  -Advised at least 150 minutes of exercise per week and a healthy diet low in saturated fats and sweets and consisting of fresh fruits and vegetables, lean meats such as fish and white chicken and whole grains.  -labs, studies and vaccines per orders this encounter  Orders Placed This Encounter  Procedures  . Tdap vaccine greater than or equal to 7yo IM    Patient Instructions  Contact your dentist about sleep apnea devices or consider this device: Contact The aveoTSD is distributed by [employer] in [place], [place], [place]. Hours 9AM to 4PM - Monday to Friday, PST. [employer] [address] [place] [address] [place] Phone: [phone]  Taper off the zoloft per instructions:  Start exercising on a regular basic  Continue counseling  Follow up in 3 months      Return in about 3 months (around 01/27/2013) for depression, sleep apnea.  Kriste Basque R.

## 2012-11-03 ENCOUNTER — Ambulatory Visit: Payer: BC Managed Care – PPO | Admitting: Family Medicine

## 2012-11-13 ENCOUNTER — Other Ambulatory Visit: Payer: Self-pay | Admitting: Dermatology

## 2012-11-21 ENCOUNTER — Ambulatory Visit: Payer: BC Managed Care – PPO | Admitting: Pulmonary Disease

## 2012-12-13 ENCOUNTER — Encounter: Payer: Self-pay | Admitting: Family Medicine

## 2012-12-15 NOTE — Telephone Encounter (Signed)
Pls advise.  

## 2012-12-16 ENCOUNTER — Telehealth: Payer: Self-pay | Admitting: Family Medicine

## 2012-12-16 MED ORDER — BUPROPION HCL ER (SR) 150 MG PO TB12: 150.0000 mg | ORAL_TABLET | Freq: Two times a day (BID) | ORAL | Status: DC

## 2012-12-16 NOTE — Telephone Encounter (Signed)
Please call and let her know ok to start wellbutrin per our discussion of the medication at her appt. Discussed risks then. Start with 150 once per day in the morning, then can increase in one week to 150 bid. Needs follow up appointment in 1 month or sooner if worsening depression or concerns.

## 2012-12-16 NOTE — Telephone Encounter (Signed)
Called and spoke with pt and pt is aware.  

## 2012-12-16 NOTE — Telephone Encounter (Signed)
pls advise

## 2012-12-16 NOTE — Telephone Encounter (Signed)
Patient is calling states that at last visit it was discussed that patient taper off of her Zoloft.  Patient was informed that if she felt like she still needed a medication to call back and notify Dr. Selena Batten and Wellbutrin would be called in.   Patient states that she is still having focusing problems, not sleeping well, and is having some anxiety.  [facility] [address]  OFFICE NOTE: PLEASE FOLLOW UP WITH SCRIPT REQUEST FROM PATIENT.

## 2013-01-29 ENCOUNTER — Ambulatory Visit: Payer: BC Managed Care – PPO | Admitting: Family Medicine

## 2013-02-13 ENCOUNTER — Encounter: Payer: Self-pay | Admitting: Family Medicine

## 2013-02-13 NOTE — Progress Notes (Signed)
Received some notes and results from Dr. Nicholas Lose. Last mammo 10/2011 Bi-rads 1 Last dexa 2008 normal Last pap1/2013 normal, hx abnormal pap 2008-2009, normal since Labs 09/2011 with HgbA1 c 5.5, lipids, CBC, CMP ok  Records scanned in.

## 2013-02-23 ENCOUNTER — Encounter: Payer: Self-pay | Admitting: Family Medicine

## 2013-02-23 ENCOUNTER — Encounter: Payer: Self-pay | Admitting: Gynecology

## 2013-03-01 DIAGNOSIS — M545 Low back pain, unspecified: Secondary | ICD-10-CM | POA: Insufficient documentation

## 2013-05-07 ENCOUNTER — Other Ambulatory Visit: Payer: Self-pay | Admitting: Family Medicine

## 2013-05-07 NOTE — Telephone Encounter (Signed)
Ok to refill once, looks like needs follow up appt. For other chronic issues.

## 2013-05-07 NOTE — Telephone Encounter (Signed)
Per Dr. Selena Batten called and spoke with pt and pt is aware.  Advised that pt should follow up;

## 2013-05-11 ENCOUNTER — Other Ambulatory Visit (HOSPITAL_COMMUNITY)
Admission: RE | Admit: 2013-05-11 | Discharge: 2013-05-11 | Disposition: A | Discharge status: Home / Self Care | Payer: PRIVATE HEALTH INSURANCE | Source: Ambulatory Visit | Attending: Family Medicine | Admitting: Family Medicine

## 2013-05-11 ENCOUNTER — Encounter: Payer: Self-pay | Admitting: Family Medicine

## 2013-05-11 ENCOUNTER — Ambulatory Visit (INDEPENDENT_AMBULATORY_CARE_PROVIDER_SITE_OTHER): Payer: PRIVATE HEALTH INSURANCE | Admitting: Family Medicine

## 2013-05-11 VITALS — BP 120/88 | HR 72 | Temp 98.5°F | Wt 146.0 lb

## 2013-05-11 DIAGNOSIS — F32A Depression, unspecified: Secondary | ICD-10-CM

## 2013-05-11 DIAGNOSIS — R234 Changes in skin texture: Secondary | ICD-10-CM

## 2013-05-11 DIAGNOSIS — Z1151 Encounter for screening for human papillomavirus (HPV): Secondary | ICD-10-CM | POA: Insufficient documentation

## 2013-05-11 DIAGNOSIS — Z01419 Encounter for gynecological examination (general) (routine) without abnormal findings: Secondary | ICD-10-CM | POA: Insufficient documentation

## 2013-05-11 DIAGNOSIS — G4733 Obstructive sleep apnea (adult) (pediatric): Secondary | ICD-10-CM

## 2013-05-11 DIAGNOSIS — Z124 Encounter for screening for malignant neoplasm of cervix: Secondary | ICD-10-CM

## 2013-05-11 DIAGNOSIS — F3289 Other specified depressive episodes: Secondary | ICD-10-CM

## 2013-05-11 DIAGNOSIS — F329 Major depressive disorder, single episode, unspecified: Secondary | ICD-10-CM

## 2013-05-11 DIAGNOSIS — L988 Other specified disorders of the skin and subcutaneous tissue: Secondary | ICD-10-CM

## 2013-05-11 NOTE — Progress Notes (Signed)
Chief Complaint  Patient presents with  . pap and labs    HPI:  Acute visit for:  1)cervical cancer screening/Hx abnormal pap 2009, normal 09/2011: -FDLMP: July 17th, 2013 -denies: irr periods, bleeding between periods  2)OSA: -not doing treatment, mild  3)Depression: -tapered off Wellbutrin -no depression    ROS: See pertinent positives and negatives per HPI.  Past Medical History  Diagnosis Date  . GERD (gastroesophageal reflux disease)   . Anxiety   . Chicken pox   . Depression   . Ulcer   . UTI (urinary tract infection)     Family History  Problem Relation Age of Onset  . Alcohol abuse Father   . Arthritis Mother   . Arthritis Father   . Uterine cancer Other   . Colon cancer Other   . Hyperlipidemia Mother   . Heart disease Father   . Hypertension Mother   . Hypertension Father   . Sudden death Paternal Grandmother   . Cancer Other     Aunts  . Emphysema Father     heavy smoker    History   Social History  . Marital Status: Single    Spouse Name: N/A    Number of Children: 0  . Years of Education: 16   Occupational History  . [employer]   Social History Main Topics  . Smoking status: Former Smoker -- 3 years    Types: Cigarettes  . Smokeless tobacco: None     Comment: previous social smoker   . Alcohol Use: Yes  . Drug Use: No  . Sexual Activity: None   Other Topics Concern  . None   Social History Narrative   Regular exercise-yes   Caffeine Use-yes    Current outpatient prescriptions:fluticasone (FLONASE) 50 MCG/ACT nasal spray, Place 2 sprays into the nose daily. , Disp: , Rfl: ;  levonorgestrel-ethinyl estradiol (LEVORA 0.15/30, 28,) 0.15-30 MG-MCG tablet, Take 1 tablet by mouth daily., Disp: 1 Package, Rfl: 11;  Multiple Vitamin (MULTIVITAMIN) tablet, Take 1 tablet by mouth daily., Disp: , Rfl: ;  omeprazole (PRILOSEC) 40 MG capsule, Take 40 mg by mouth daily as needed. , Disp: , Rfl:  valACYclovir (VALTREX) 1000 MG  tablet, Take 1 tablet (1,000 mg total) by mouth as needed., Disp: 10 tablet, Rfl: 0  EXAM:  Filed Vitals:   05/11/13 0819  BP: 120/88  Pulse: 72  Temp: 98.5 F (36.9 C)    Body mass index is 24.3 kg/(m^2).  GENERAL: vitals reviewed and listed above, alert, oriented, appears well hydrated and in no acute distress  HEENT: atraumatic, conjunttiva clear, no obvious abnormalities on inspection of external nose and ears  NECK: no obvious masses on inspection  LUNGS: clear to auscultation bilaterally, no wheezes, rales or rhonchi, good air movement  GU: normal, pap obtained  CV: HRRR, no peripheral edema  MS: moves all extremities without noticeable abnormality  PSYCH: pleasant and cooperative, no obvious depression or anxiety  ASSESSMENT AND PLAN:  Discussed the following assessment and plan:  Cervical cancer screening  Depression  OSA (obstructive sleep apnea)  -pap obtained -advised to look into mouth piece -depression resolved -Patient advised to return or notify a doctor immediately if symptoms worsen or persist or new concerns arise.  Patient Instructions  -We have ordered labs or studies at this visit (pap smear). It can take up to 1-2 weeks for results and processing. We will contact you with instructions IF your results are abnormal. Normal results will be released  to your [facility]. If you have not heard from [place] or can not find your results in [facility] in 2 weeks please contact our office.  -schedule mammogram   -follow up in 1 year or as needed            Kierstyn Baranowski R.

## 2013-05-11 NOTE — Addendum Note (Signed)
Addended by: Kern Reap B on: 05/11/2013 09:03 AM   Modules accepted: Orders

## 2013-05-11 NOTE — Patient Instructions (Addendum)
-  We have ordered labs or studies at this visit (pap smear). It can take up to 1-2 weeks for results and processing. We will contact you with instructions IF your results are abnormal. Normal results will be released to your [facility]. If you have not heard from [place] or can not find your results in [facility] in 2 weeks please contact our office.  -schedule mammogram   -follow up in 1 year or as needed

## 2013-05-12 ENCOUNTER — Telehealth: Payer: Self-pay | Admitting: Family Medicine

## 2013-05-12 NOTE — Telephone Encounter (Signed)
[facility]- [facility] not [facility]; given verbally.  Pt is aware.

## 2013-05-12 NOTE — Telephone Encounter (Signed)
Pt states the valACYclovir (VALTREX) 1000 MG tablet should have been called in. Pls advise [facility] [address]

## 2013-05-15 DIAGNOSIS — R234 Changes in skin texture: Secondary | ICD-10-CM | POA: Insufficient documentation

## 2013-05-15 NOTE — Progress Notes (Signed)
Quick Note:  Left a message for pt to return call. ______ 

## 2013-05-16 NOTE — Progress Notes (Signed)
Quick Note:  Left message for return call.   ______ 

## 2013-05-18 NOTE — Progress Notes (Signed)
Quick Note:  Called and spoke with pt and pt is aware. Information mailed to pt for gynecologist in [place]. ______

## 2013-06-14 ENCOUNTER — Other Ambulatory Visit: Payer: Self-pay | Admitting: Family Medicine

## 2013-06-29 ENCOUNTER — Other Ambulatory Visit: Payer: Self-pay | Admitting: Family Medicine

## 2013-07-30 ENCOUNTER — Other Ambulatory Visit: Payer: Self-pay

## 2013-08-22 ENCOUNTER — Other Ambulatory Visit: Payer: Self-pay | Admitting: Family Medicine

## 2013-09-28 ENCOUNTER — Other Ambulatory Visit: Payer: Self-pay | Admitting: Family Medicine

## 2013-10-25 ENCOUNTER — Encounter: Payer: Self-pay | Admitting: Family Medicine

## 2013-10-26 MED ORDER — BUPROPION HCL ER (SR) 150 MG PO TB12: ORAL_TABLET | ORAL | Status: DC

## 2013-10-26 NOTE — Telephone Encounter (Signed)
Called and spoke with pt and pt is aware of Dr. Elmyra RicksKim's recommendations.  Pt verbalized understanding and will call to make a 1 month appointment.  Pt states she will remain a patient of Dr. Elmyra RicksKim's and be seen at an UC if she has an acute need.  Pharmacy updated and rx sent.

## 2013-10-26 NOTE — Telephone Encounter (Signed)
It is ok to restart the wellbutrin again if she wishes. Wellbutrin SR 150 mg daily for three days, then 150mg  bid. We can give her a one month supply with 1 refill. If living in [place] would advise she establish with a doctor there unless she is ok with driving back here for appointments? Would need to see her in 1 month to reassess or she needs to see a doctor there. Of course if severe depression or thoughts of self harm should see a doctor immediatly. Unfortunately, I am not familiar with counselors/psychologists in the [place] area.

## 2013-11-08 ENCOUNTER — Other Ambulatory Visit: Payer: Self-pay | Admitting: Family Medicine

## 2013-12-30 ENCOUNTER — Other Ambulatory Visit: Payer: Self-pay | Admitting: Family Medicine

## 2014-01-15 ENCOUNTER — Other Ambulatory Visit: Payer: Self-pay | Admitting: Family Medicine

## 2014-02-09 ENCOUNTER — Telehealth: Payer: Self-pay | Admitting: Family Medicine

## 2014-02-09 NOTE — Telephone Encounter (Signed)
Dr Kim-is it Ok to fill this?  I saw a note previously stating the pt should find someone in [place] to fill Rx or she needs an appt here?

## 2014-02-09 NOTE — Telephone Encounter (Signed)
[id number] - [place], [place] - [address] is requesting 90 day re-fill on buPROPion (WELLBUTRIN SR) 150 MG 12 hr tablet

## 2015-02-03 ENCOUNTER — Encounter: Payer: Self-pay | Admitting: Family Medicine

## 2015-02-03 ENCOUNTER — Ambulatory Visit (INDEPENDENT_AMBULATORY_CARE_PROVIDER_SITE_OTHER): Payer: BLUE CROSS/BLUE SHIELD | Admitting: Family Medicine

## 2015-02-03 VITALS — BP 122/84 | HR 109 | Temp 97.6°F | Ht 65.0 in | Wt 156.9 lb

## 2015-02-03 DIAGNOSIS — G8929 Other chronic pain: Secondary | ICD-10-CM

## 2015-02-03 DIAGNOSIS — M25521 Pain in right elbow: Secondary | ICD-10-CM | POA: Diagnosis not present

## 2015-02-03 DIAGNOSIS — E039 Hypothyroidism, unspecified: Secondary | ICD-10-CM | POA: Diagnosis not present

## 2015-02-03 DIAGNOSIS — F329 Major depressive disorder, single episode, unspecified: Secondary | ICD-10-CM

## 2015-02-03 DIAGNOSIS — E042 Nontoxic multinodular goiter: Secondary | ICD-10-CM

## 2015-02-03 DIAGNOSIS — F32A Depression, unspecified: Secondary | ICD-10-CM

## 2015-02-03 NOTE — Progress Notes (Signed)
HPI:  Follow up:  Ashley Allen is a pleasant 44 yo F with PMT anxiety and depression not seen here in several years.  Depression and Anxiety: -chronic, intermittent -recurrence of depression the past few months, put -now quit job that she hated and doing better -on wellbutrin remotely  -symptoms: depression and GAD -denies: SI, hx hospitalixation  Thyroid nodules/hypothyroid/adrenal fatigue: -[facility] and was placed on leothyronine, progesterone and "$400 worth" of supplements -now living in [place] again, doesn't really feel any better on this and does not want to take if does not need to -reports had dx thyroid nodules in sept 2015 in [facility], s/p biopsy, told need q 6 month USs -has been on and off thyroid medications in the past -saw endocrine here in the past and wants to follow up with her but can't remember name (Dr. Elvera LennoxGherghe) now that she is back in [place] -chronic fatigue her whole life -no weight changes, constipation, palpitations, skin changes, hot/cold flashes - but tends to be cold  Allen elbow pain: -started a few months ago during certain exercises with PT for wrist pain -hx remote dislocation of this elbow -mild pain in ext tendons forearm Allen with certain activities only -denies: weakness, numbness, constant pain  HM: -mammo -HIV  ROS: See pertinent positives and negatives per HPI.  Past Medical History  Diagnosis Date  . GERD (gastroesophageal reflux disease)   . Anxiety   . Chicken pox   . Depression   . Ulcer   . UTI (urinary tract infection)   . Thyroid nodule     benign    Past Surgical History  Procedure Laterality Date  . Breast enhancement surgery  2007  . Nasal sinus surgery  ?2010    Family History  Problem Relation Age of Onset  . Alcohol abuse Father   . Arthritis Mother   . Arthritis Father   . Uterine cancer Other   . Colon cancer Other   . Hyperlipidemia Mother   . Heart disease Father   .  Hypertension Mother   . Hypertension Father   . Sudden death Paternal Grandmother   . Cancer Other     Aunts  . Emphysema Father     heavy smoker    History   Social History  . Marital Status: Single    Spouse Name: N/A  . Number of Children: 0  . Years of Education: 16   Occupational History  . Sales [employer]   Social History Main Topics  . Smoking status: Former Smoker -- 3 years    Types: Cigarettes  . Smokeless tobacco: Not on file     Comment: previous social smoker   . Alcohol Use: Yes  . Drug Use: No  . Sexual Activity: Not on file   Other Topics Concern  . None   Social History Narrative   Regular exercise-yes   Caffeine Use-yes     Current outpatient prescriptions:  .  busPIRone (BUSPAR) 10 MG tablet, , Disp: , Rfl: 1 .  Cholecalciferol (VITAMIN D-3 PO), Take by mouth., Disp: , Rfl:  .  fluticasone (FLONASE) 50 MCG/ACT nasal spray, USE 1 SPRAY INTO EACH NOSTRIL TWICE DAILY, Disp: 16 g, Rfl: 6 .  IODINE EX, Take by mouth., Disp: , Rfl:  .  levonorgestrel (MIRENA) 20 MCG/24HR IUD, 1 each by Intrauterine route once., Disp: , Rfl:  .  liothyronine (CYTOMEL) 5 MCG tablet, , Disp: , Rfl: 0 .  MAGNESIUM CITRATE PO, Take by mouth., Disp: , Rfl:  .  NON FORMULARY, Inflavonoid Intensive Care, Disp: , Rfl:  .  omeprazole (PRILOSEC) 40 MG capsule, Take 40 mg by mouth daily as needed. , Disp: , Rfl:  .  valACYclovir (VALTREX) 1000 MG tablet, TAKE 1 TABLET BY MOUTH EVERY DAY AS NEEDED, Disp: 10 tablet, Rfl: 0  EXAM:  Filed Vitals:   02/03/15 1528  BP: 122/84  Pulse: 109  Temp: 97.6 F (36.4 C)    Body mass index is 26.11 kg/(m^2).  GENERAL: vitals reviewed and listed above, alert, oriented, appears well hydrated and in no acute distress  HEENT: atraumatic, conjunttiva clear, no obvious abnormalities on inspection of external nose and ears  NECK: no obvious masses on inspection  LUNGS: clear to auscultation bilaterally, no wheezes, rales or rhonchi,  good air movement  CV: HRRR, no peripheral edema  MS: moves all extremities without noticeable abnormality -no findings on exam today - reports she is not having the symptoms today -normal exam of elbows, wrists, arms  PSYCH: pleasant and cooperative, no obvious depression or anxiety  ASSESSMENT AND PLAN:  Discussed the following assessment and plan:  Depression -she is doing well on insignificant dose of buspar - advised taper off as job change is likely the biggest reason she is feeling better -advised CBT for any recurrent mild symptoms and follow up with me in 1 month to see how she is doing off of the buspar  Hypothyroidism, unspecified hypothyroidism type Multiple thyroid nodules ?adrenal fatigue -was placed on multiple supplements, leothyronine, progesterone which she reports she does not really feel any better on -wants to follow up with her [place] endocrinologist - she had seen Dr. Elvera LennoxGherghe so # provided, my hunch is she can stop the above medications and recheck labs off medications with Dr. Elvera LennoxGherghe since no improvement  Elbow pain, chronic, right -suspect mild ext tendonitis of forearm -HEP provided, follow up in 1 month  -Patient advised to return or notify a doctor immediately if symptoms worsen or persist or new concerns arise.  Patient Instructions  BEFORE YOU LEAVE: -exercises for lateral epicondylitis -follow up in 1 month -office number for Dr. Elvera LennoxGherghe  Please wean of the Buspar  Please call for appointment with Dr. Elvera LennoxGherghe  Consider appointment with Dr. Jason FilaBray to assist with depression if needed  Do the exercises 3 days per week for the elbow and use aleve and ice if needed  We recommend the following healthy lifestyle measures: - eat a healthy diet consisting of lots of vegetables, fruits, beans, nuts, seeds, healthy meats such as white chicken and fish and whole grains.  - avoid starches, sweets, fried foods, fast food, processed foods, sodas, red meet and  other fattening foods.  - get a least 150 minutes of aerobic exercise per week.       Ashley BasqueKIM, Ashley Allen.

## 2015-02-03 NOTE — Patient Instructions (Signed)
BEFORE YOU LEAVE: -exercises for lateral epicondylitis -follow up in 1 month -office number for Dr. Elvera LennoxGherghe  Please wean of the Buspar  Please call for appointment with Dr. Elvera LennoxGherghe  Consider appointment with Dr. Jason FilaBray to assist with depression if needed  Do the exercises 3 days per week for the elbow and use aleve and ice if needed  We recommend the following healthy lifestyle measures: - eat a healthy diet consisting of lots of vegetables, fruits, beans, nuts, seeds, healthy meats such as white chicken and fish and whole grains.  - avoid starches, sweets, fried foods, fast food, processed foods, sodas, red meet and other fattening foods.  - get a least 150 minutes of aerobic exercise per week.

## 2015-02-03 NOTE — Progress Notes (Signed)
Pre visit review using our clinic review tool, if applicable. No additional management support is needed unless otherwise documented below in the visit note. 

## 2015-03-03 ENCOUNTER — Ambulatory Visit: Payer: BLUE CROSS/BLUE SHIELD | Admitting: Family Medicine

## 2015-03-03 ENCOUNTER — Ambulatory Visit (INDEPENDENT_AMBULATORY_CARE_PROVIDER_SITE_OTHER): Payer: BLUE CROSS/BLUE SHIELD | Admitting: Internal Medicine

## 2015-03-03 ENCOUNTER — Encounter: Payer: Self-pay | Admitting: Internal Medicine

## 2015-03-03 VITALS — BP 104/62 | HR 88 | Temp 98.2°F | Resp 12 | Wt 155.6 lb

## 2015-03-03 DIAGNOSIS — E039 Hypothyroidism, unspecified: Secondary | ICD-10-CM

## 2015-03-03 LAB — TSH: TSH: 0.85 u[IU]/mL (ref 0.35–4.50)

## 2015-03-03 LAB — T3, FREE: T3, Free: 3.9 pg/mL (ref 2.3–4.2)

## 2015-03-03 LAB — T4, FREE: Free T4: 0.81 ng/dL (ref 0.60–1.60)

## 2015-03-03 NOTE — Progress Notes (Signed)
Subjective:     Patient ID: Ashley Allen, female   DOB: 09/05/1971, 44 y.o.   MRN: [id number]  HPI Ashley Allen is a 44 y.o.y/o woman returning for follow-up for hypothyroidism and thyroid nodules, referred by her PCP, Dr Selena Batten. Last visit was 2.5 years ago. She moved to [facility] for her job initially, but now returned to [place] and would like to reestablish care with me.  Hypothyroidism: Reviewed hx: She was started on Synthroid by her previous endocrinologist in 2008, for goiter, but she does not remember having blood work or thyroid U/S done, so it is unclear whether this was started for hypothyroidism or in an attempt to decrease the size of a thyroid nodule that was palpated at that time.   She took the medication for about 6 months then, then stopped it 8 years ago per [employer]. She had normal TSH (0.39 on 07/03/2012 - LLN 0.35) despite being off Synthroid. She did not feel different after starting or after stopping the medication.   At her last visit with her PCP, in 08/18/2012, she had multiple vague complaints: fatigue, wt gain, constipation, feeling cold. She was wondering whether she needed to restart Synthroid at that time so she was referred to endocrinology.  At last visit, we checked thyroid labs >> all normal: Component     Latest Ref Rng 08/28/2012  TSH     0.350 - 4.500 uIU/mL 0.921  T3, Free     2.3 - 4.2 pg/mL 2.8  Free T4     0.80 - 1.80 ng/dL 4.09  TSI     <811 % baseline 75  Thyroperoxidase Ab SerPl-aCnc     <35.0 IU/mL 26.3   I therefore, did not recommend to start thyroid medication.  After her moved to [facility] she started to see a naturopath, who started Cytomel 10 mg bid in 07/2014 >> she felt well, but did not know whether this is mandatory medication so she tapered it off, now completely off for last 2 weeks.   I reviewed thyroid labs from her naturopath: 11/03/2014, while on Cytomel: TSH 0.02 (0.4-4.5), free T4 0.9 (0.8-1.8), free T3 4.2 (2.3-4.2),  reverse T3 11 (8-25), serum iodine 44 (50-109). A note was made that the thyroid status was "perfect".  She was continued on Cytomel at that time, and was recommended to increased iodine to 6 drops per day.  She did not have labs repeated afterwards.  She noticed: -  weight gain  - Feeling cold - Constipation - Frequent urination - Joint pain   Thyroid nodules:  Patient had a thyroid U/S (05/2014): 2 thyroid nodules:  Isoechoic nodule at right superior pole and isoechoicnodule at right inferior. Normal vascularity, no calcification.   Right upper: 1.6 x 1.0 x 1.4 cm, right lower: 2.3 x 1.8 x 1.9 cm    FNA (06/21/2014): 1. Thyroid nodule, right upper, FNA (smears):  - Bethesda category: 2Benign.  - Benign follicular nodule, consistent with colloid nodule.    2. Thyroid nodule, right lower, FNA (smears):  - Bethesda category: 2Benign.  - Benign follicular nodule, consistent with colloid nodule.    She was advised to have thyroid ultrasound every 6 months to follow-up on the nodules (?).  Of note, pt's mother and sister has Graves ds.   I reviewed pt's medications, allergies, PMH, social hx, family hx, and changes were documented in the history of present illness. Otherwise, unchanged from my initial visit note:  Past Medical History  Diagnosis Date  .  GERD (gastroesophageal reflux disease)   . Anxiety   . Chicken pox   . Depression   . Ulcer   . UTI (urinary tract infection)   . Thyroid nodule     benign   Past Surgical History  Procedure Laterality Date  . Breast enhancement surgery  2007  . Nasal sinus surgery  ?2010   History   Social History  . Marital Status: Single    Spouse Name: N/A    Number of Children: 0  . Years of Education: 51, college   Occupational History  . Sales [employer]   Social History Main Topics  . Smoking status: Former Games developer  . Smokeless tobacco: Not on file     Comment: previous social smoker   . Alcohol  Use: Yes  . Drug Use: No  . Sexually Active: Not on file   Other Topics Concern  . Not on file   Social History Narrative   Regular exercise-yesCaffeine Use-yes    Current Outpatient Prescriptions on File Prior to Visit  Medication Sig Dispense Refill  . busPIRone (BUSPAR) 10 MG tablet   1  . Cholecalciferol (VITAMIN D-3 PO) Take by mouth.    . fluticasone (FLONASE) 50 MCG/ACT nasal spray USE 1 SPRAY INTO EACH NOSTRIL TWICE DAILY 16 g 6  . IODINE EX Take by mouth.    . levonorgestrel (MIRENA) 20 MCG/24HR IUD 1 each by Intrauterine route once.    Marland Kitchen liothyronine (CYTOMEL) 5 MCG tablet   0  . MAGNESIUM CITRATE PO Take by mouth.    . NON FORMULARY Inflavonoid Intensive Care    . omeprazole (PRILOSEC) 40 MG capsule Take 40 mg by mouth daily as needed.     . valACYclovir (VALTREX) 1000 MG tablet TAKE 1 TABLET BY MOUTH EVERY DAY AS NEEDED 10 tablet 0   No current facility-administered medications on file prior to visit.   Allergies  Allergen Reactions  . Polysporin [Bacitracin-Polymyxin B]   . Sulfonamide Derivatives     REACTION: Itching   Family History  Problem Relation Age of Onset  . Alcohol abuse Father   . Arthritis Mother   . Arthritis Father   . Uterine cancer Other   . Colon cancer Other   . Hyperlipidemia Mother   . Heart disease Father   . Hypertension Mother   . Hypertension Father   . Sudden death Paternal Grandmother   . Cancer Other     Aunts  . Emphysema Father     heavy smoker   Review of Systems per HPI+ Constitutional: has subjective hypothermia Eyes: no blurry vision, no xerophthalmia ENT: no sore throat, no nodules palpated in throat, no dysphagia/odynophagia, no hoarseness Cardiovascular: no CP/SOB/palpitations/leg swelling Respiratory: no cough/SOB Gastrointestinal: no N/V/D/+ C Musculoskeletal: no muscle/+ joint aches Skin: no rashes Neurological: no tremors/numbness/tingling/dizziness + Low libido  Objective:   Physical Exam BP 104/62  mmHg  Pulse 88  Temp(Src) 98.2 F (36.8 C) (Oral)  Resp 12  Wt 155 lb 9.6 oz (70.58 kg)  SpO2 97% Body mass index is 25.89 kg/(m^2). Wt Readings from Last 3 Encounters:  03/03/15 155 lb 9.6 oz (70.58 kg)  02/03/15 156 lb 14.4 oz (71.169 kg)  05/11/13 146 lb (66.225 kg)   Constitutional: overweight, in NAD Eyes: PERRLA, EOMI, no exophthalmos ENT: moist mucous membranes, thyroid lobe R larger than the L, no clear nodule palpated, no cervical lymphadenopathy Cardiovascular: RRR, No MRG Respiratory: CTA B Gastrointestinal: abdomen soft, NT, ND, BS+ Musculoskeletal: no deformities,  strength intact in all 4 Skin: moist, warm, no rashes Neurological: no tremor with outstretched hands, DTR normal in all 4     Assessment:     1. ? Hypothyroidism  2. 2 thyroid nodules    Plan:     1. ? Hypothyroidism  -We reviewed together the labs obtained at last visit in 2013, which were all normal off thyroid hormones and then the labs obtained by her naturopath that showed a suppressed TSH. We discussed about negative health risks of a suppressed TSH and the fact that she did the right thing coming off Cytomel. She does not feel poorly after coming off the medication.  - We will check TSH, free T3 and free T4 today (2 weeks is presumably enough time after stopping Cytomel since this has a shorter half life compared to levothyroxine).  2. Thyroid nodules - Patient had 2 right thyroid nodules on ultrasound obtained on 05/2014. Images were not available, however, I reviewed the FNA reports showing benign pathology for both nodules. - We discussed that without the high risk for cancer (previous exposure to radiation or family history of thyroid cancer) and with benign biopsies, her risk of thyroid cancer are not slim. There is no need to repeat thyroid ultrasound every 6 months, but we will repeat one in 2 years from the previous, in 05/2016. If there is a change in the appearance of the nodules, we may  need to repeat the biopsies. Patient agrees with the plan. Return in about 1 year (around 03/02/2016).   - time spent with the patient: 40 minutes, of which >50% was spent in obtaining information about her symptoms, reviewing her previous labs, evaluations, and treatments in the last 2.5 years, counseling her about her condition (please see the discussed topics above), and developing a plan to further investigate them further; she had a number of questions which I addressed.  Office Visit on 03/03/2015  Component Date Value Ref Range Status  . TSH 03/03/2015 0.85  0.35 - 4.50 uIU/mL Final  . Free T4 03/03/2015 0.81  0.60 - 1.60 ng/dL Final  . T3, Free 16/06/9603 3.9  2.3 - 4.2 pg/mL Final   TFTs normal >> NO hypothyroidism. Does not need thyroid hormones. We will repeat the tests in 6 weeks to ensure stability.

## 2015-03-03 NOTE — Patient Instructions (Signed)
Please stop at the lab.  Please return in 1 year.  

## 2015-03-14 ENCOUNTER — Encounter: Payer: Self-pay | Admitting: Family Medicine

## 2015-03-15 ENCOUNTER — Other Ambulatory Visit: Payer: Self-pay | Admitting: Family Medicine

## 2015-03-15 DIAGNOSIS — M25521 Pain in right elbow: Secondary | ICD-10-CM

## 2015-03-15 NOTE — Progress Notes (Signed)
Left a message for the pt to return my call.  

## 2015-03-15 NOTE — Progress Notes (Signed)
Would advise sports medicine for the elbow if not improving and wants to see specialist. Dr. Katrinka Blazing if possible. Please see if pt ok with this and send referral. Thanks.

## 2015-03-15 NOTE — Progress Notes (Signed)
I called the pt and she states she would like to see the specialist.  She is aware the referral was placed and someone will call her with an appt.

## 2015-03-30 ENCOUNTER — Ambulatory Visit: Payer: BLUE CROSS/BLUE SHIELD | Admitting: Family Medicine

## 2015-04-04 ENCOUNTER — Other Ambulatory Visit (INDEPENDENT_AMBULATORY_CARE_PROVIDER_SITE_OTHER): Payer: BLUE CROSS/BLUE SHIELD

## 2015-04-04 ENCOUNTER — Encounter: Payer: Self-pay | Admitting: Family Medicine

## 2015-04-04 ENCOUNTER — Ambulatory Visit (INDEPENDENT_AMBULATORY_CARE_PROVIDER_SITE_OTHER): Payer: BLUE CROSS/BLUE SHIELD | Admitting: Family Medicine

## 2015-04-04 VITALS — BP 126/84 | HR 73 | Wt 161.0 lb

## 2015-04-04 DIAGNOSIS — M25521 Pain in right elbow: Secondary | ICD-10-CM | POA: Diagnosis not present

## 2015-04-04 DIAGNOSIS — G5611 Other lesions of median nerve, right upper limb: Secondary | ICD-10-CM | POA: Diagnosis not present

## 2015-04-04 NOTE — Progress Notes (Signed)
Pre visit review using our clinic review tool, if applicable. No additional management support is needed unless otherwise documented below in the visit note. 

## 2015-04-04 NOTE — Progress Notes (Signed)
Ashley Allen D.O.  Sports Medicine [address] [place], [place] [address] Phone: [phone] Subjective:    I'm seeing this patient by the request  of:  [name], [name], DO   CC: right elbow pain  AOZ:[id number]:Subjective Ashley Allen is a 44 y.o. female coming in with complaint of patient is complaining of right elbow pain. Patient is had this pain for months.patients if that it is not always over the lateral aspect the sometimes over the forearm itself. Patient does have a history of some neck pain as well as a pinch disc but states that this feels different. States that certain activities can be more painful. Patient does do bowling and notices pain with this as well. Patient has tried some home modalities including anti-inflammatory's with mild to moderate benefit. Patient's primary care provider did give her a prescription medication which was somewhat beneficial. Patient states that it is starting to affect some of her daily activities and can give her chronic dull aching sensation even at night. Denies any numbness of the hand and would not states that there is any weakness. Patient though cannot do anything more than a regular daily activities without any increasing discomfort. Patient has not been able to lift weights for multiple months.    Past Medical History  Diagnosis Date  . GERD (gastroesophageal reflux disease)   . Anxiety   . Chicken pox   . Depression   . Ulcer   . UTI (urinary tract infection)   . Thyroid nodule     benign   Past Surgical History  Procedure Laterality Date  . Breast enhancement surgery  2007  . Nasal sinus surgery  ?2010   History  Substance Use Topics  . Smoking status: Former Smoker -- 3 years    Types: Cigarettes  . Smokeless tobacco: Not on file     Comment: previous social smoker   . Alcohol Use: Yes   Allergies  Allergen Reactions  . Polysporin [Bacitracin-Polymyxin B]   . Sulfonamide Derivatives     REACTION: Itching   Family  History  Problem Relation Age of Onset  . Alcohol abuse Father   . Arthritis Mother   . Arthritis Father   . Uterine cancer Other   . Colon cancer Other   . Hyperlipidemia Mother   . Heart disease Father   . Hypertension Mother   . Hypertension Father   . Sudden death Paternal Grandmother   . Cancer Other     Aunts  . Emphysema Father     heavy smoker    Past medical history, social, surgical and family history all reviewed in electronic medical record.   Review of Systems: No headache, visual changes, nausea, vomiting, diarrhea, constipation, dizziness, abdominal pain, skin rash, fevers, chills, night sweats, weight loss, swollen lymph nodes, body aches, joint swelling, muscle aches, chest pain, shortness of breath, mood changes.   Objective Blood pressure 126/84, pulse 73, weight 161 lb (73.029 kg), SpO2 98 %.  General: No apparent distress alert and oriented x3 mood and affect normal, dressed appropriately.  HEENT: Pupils equal, extraocular movements intact  Respiratory: Patient's speak in full sentences and does not appear short of breath  Cardiovascular: No lower extremity edema, non tender, no erythema  Skin: Warm dry intact with no signs of infection or rash on extremities or on axial skeleton.  Abdomen: Soft nontender  Neuro: Cranial nerves II through XII are intact, neurovascularly intact in all extremities with 2+ DTRs and 2+ pulses.  Lymph: No lymphadenopathy of posterior or anterior cervical chain or axillae bilaterally.  Gait normal with good balance and coordination.  MSK:  Non tender with full range of motion and good stability and symmetric strength and tone of shoulders,  wrist, hip, knee and ankles bilaterally.  Elbow:right Unremarkable to inspection. Range of motion full pronation, supination, flexion, extension. Strength is full to all of the above directionsin with full pronation though noted. Stable to varus, valgus stress. Negative moving valgus stress  test. Normal tenderness over the lateral epicondylar region.Marland Kitchen Ulnar nerve does not sublux. Negative cubital tunnel Tinel's.  Musculoskeletal ultrasound was performed and interpreted by Terrilee Files D.O.   Elbow: right Lateral epicondyle and common extensor tendon origin visualized.  No edema, effusions, or avulsions seen. She does have some mild chronic changes at its insertion. Patient does have what also appears to be some hypoechoic changes and increasing upper flow of an underlying pronator muscle. Radial head unremarkable and located in annular ligament Medial epicondyle and common flexor tendon origin visualized.  No edema, effusions, or avulsions seen. Ulnar nerve in cubital tunnel unremarkable. Olecranon and triceps insertion visualized and unremarkable without edema, effusion, or avulsion.  No signs olecranon bursitis. Power doppler signal normal.  IMPRESSION:  Chronic pronator syndrome noted   Procedure note 97110; 15 minutes spent for Therapeutic exercises as stated in above notes.  This included exercises focusing on stretching, strengthening, with significant focus on eccentric aspects.  Patient given exercises for pronation supination as well as flexion and extension focusing on eccentric strengthening exercises. Proper technique shown and discussed handout in great detail with ATC.  All questions were discussed and answered.     Impression and Recommendations:     This case required medical decision making of moderate complexity.

## 2015-04-04 NOTE — Patient Instructions (Signed)
Good to meet you Ice 20 minutes 2 times daily. Usually after activity and before bed. Exercises 3 times a week.  OK to lift with thumbs up 2-3 times a week.  pennsaid pinkie amount topically 2 times daily as needed.  Pronator Syndrome with Rehab Pronator syndrome is a disorder of the nervous system that causes pain, weakness, and loss of feeling in the upper arm, elbow, and hand. The condition is caused by compression of a major nerve in the arm (the median nerve) by muscles and/or ligament-like structures in the forearm. SYMPTOMS   Tingling, numbness, loss of feeling, and/ or a burning sensation in the hand and fingers, especially the thumb and first three fingers.  Shooting pain from the elbow to the wrist and hand.  Stiffness and/ or cramping of the hands in the morning.  Shiny, dry skin on the hand.  Reduced performance in sports requiring strong grip. CAUSES  Pronator syndrome is caused by pressure being placed on the median nerve in the forearm. This pressure reduces the function of the nerve, causing symptoms. The pressure may be caused by swollen, inflamed or scarred tissue between muscles of the forearm. The compression may also be caused by inflammation of the nerve due to infection. RISK INCREASES WITH:  Activities that involve repetitive and/or strenuous forearm and wrist movements (racquet sports or carpentry).  Poor strength and flexibility.  Failure to warm-up properly before activity.  Diabetes mellitus.  Underactive thyroid gland (hypothyroidism) PREVENTION  Warm up and stretch properly before activity.  Allow for adequate recovery between workouts.  Maintain physical fitness:  Strength, flexibility, and endurance.  Cardiovascular fitness.  Learn and use proper technique. When possible, have a coach correct improper technique.  Wear properly fitted and padded equipment. PROGNOSIS  If treated properly, then the symptoms of pronator syndrome usually  resolve with treatment, and occasionally it may heal spontaneously. If the muscle begins to waste (atrophy), then surgery is necessary. RELATED COMPLICATIONS  Permanent nerve damage including:  Weakness, numbness, or paralysis of the hand or forearm. TREATMENT  Treatment initially involves resting form any activities that aggravate the symptoms. Certain individuals find that shaking their hands or dangling the arm helps relieve symptoms. The use of strengthening and stretching exercises may help reduce pain with activity. These exercises may be performed at home or with referral to a therapist. If non-surgical (conservative) treatment is unsuccessful or if the muscles begin to atrophy, then surgery may be necessary. Surgery usually provides complete relief.  MEDICATION   If pain medication is necessary, then nonsteroidal anti-inflammatory medications, such as aspirin and ibuprofen, or other minor pain relievers, such as acetaminophen, are often recommended.  Do not take pain medication for 7 days before surgery.  Prescription pain relievers may be given if deemed necessary by your caregiver. Use only as directed and only as much as you need. HEAT AND COLD  Cold treatment (icing) relieves pain and reduces inflammation. Cold treatment should be applied for 10 to 15 minutes every 2 to 3 hours for inflammation and pain and immediately after any activity that aggravates your symptoms. Use ice packs or massage the area with a piece of ice (ice massage).  Heat treatment may be used prior to performing the stretching and strengthening activities prescribed by your caregiver, physical therapist, or athletic trainer. Use a heat pack or soak the injury in warm water. SEEK MEDICAL CARE IF:  Treatment seems to offer no benefit, or the condition worsens.  Any medications produce adverse side  effects.  Any complications from surgery occur:  Pain, numbness, or coldness in the extremity operated  upon.  Discoloration of the nail beds (they become blue or gray) of the extremity operated upon.  Signs of infections (fever, pain, inflammation, redness, or persistent bleeding). EXERCISES RANGE OF MOTION (ROM) AND STRETCHING EXERCISES - Pronator Syndrome These exercises may help you when beginning to rehabilitate your injury. Your symptoms may resolve with or without further involvement from your physician, physical therapist or athletic trainer. While completing these exercises, remember:   Restoring tissue flexibility helps normal motion to return to the joints. This allows healthier, less painful movement and activity.  An effective stretch should be held for at least 30 seconds.  A stretch should never be painful. You should only feel a gentle lengthening or release in the stretched tissue. RANGE OF MOTION - Wrist Flexion, Active-Assisted  Extend your right / left elbow with your fingers pointing down.*  Gently pull the back of your hand towards you until you feel a gentle stretch on the top of your forearm.  Hold this position for __________ seconds. Repeat __________ times. Complete this exercise __________ times per day.  *If directed by your physician, physical therapist or athletic trainer, complete this stretch with your elbow bent rather than extended. RANGE OF MOTION - Wrist Extension, Active-Assisted   Extend your right / left elbow and turn your palm upwards.*  Gently pull your palm/fingertips back so your wrist extends and your fingers point more toward the ground.  You should feel a gentle stretch on the inside of your forearm.  Hold this position for __________ seconds. Repeat __________ times. Complete this exercise __________ times per day. *If directed by your physician, physical therapist or athletic trainer, complete this stretch with your elbow bent, rather than extended. STRETCH - Wrist Flexion   Place the back of your right / left hand on a tabletop  leaving your elbow slightly bent. Your fingers should point away from your body.  Gently press the back of your hand down onto the table by straightening your elbow. You should feel a stretch on the top of your forearm.  Hold this position for __________ seconds. Repeat __________ times. Complete this stretch __________ times per day.  STRETCH - Wrist Extension   Place your right / left fingertips on a tabletop leaving your elbow slightly bent. Your fingers should point backwards.  Gently press your fingers and palm down onto the table by straightening your elbow. You should feel a stretch on the inside of your forearm.  Hold this position for __________ seconds. Repeat __________ times. Complete this stretch __________ times per day.  STRENGTH - Pronator Syndrome These exercises may help you when beginning to rehabilitate your injury. They may resolve your symptoms with or without further involvement from your physician, physical therapist or athletic trainer. While completing these exercises, remember:   Muscles can gain both the endurance and the strength needed for everyday activities through controlled exercises.  Complete these exercises as instructed by your physician, physical therapist or athletic trainer. Progress the resistance and repetitions only as guided. STRENGTH - Wrist Flexors  Sit with your right / left forearm palm-up and fully supported. Your elbow should be resting below the height of your shoulder. Allow your wrist to extend over the edge of the surface.  Loosely holding a __________ weight or a piece of rubber exercise band/tubing, slowly curl your hand up toward your forearm.  Hold this position for __________ seconds. Slowly  lower the wrist back to the starting position in a controlled manner. Repeat __________ times. Complete this exercise __________ times per day.  STRENGTH - Wrist Extensors  Sit with your right / left forearm palm-down and fully supported.  Your elbow should be resting below the height of your shoulder. Allow your wrist to extend over the edge of the surface.  Loosely holding a __________ weight or a piece of rubber exercise band/tubing, slowly curl your hand up toward your forearm.  Hold this position for __________ seconds. Slowly lower the wrist back to the starting position in a controlled manner. Repeat __________ times. Complete this exercise __________ times per day.  STRENGTH - Radial Deviators  Stand with a ____________________ weight in your right / left hand or sit holding on to the rubber exercise band/tubing with your arm supported.  Raise your hand upward in front of you or pull up on the rubber tubing.  Hold this position for __________ seconds and then slowly lower the wrist back to the starting position. Repeat __________ times. Complete this exercise __________ times per day. STRENGTH - Forearm Supinators   Sit with your right / left forearm supported on a table, keeping your elbow below shoulder height. Rest your hand over the edge, palm down.  Gently grip a hammer or a soup ladle.  Without moving your elbow, slowly turn your palm and hand upward to a "thumbs-up" position.  Hold this position for __________ seconds. Slowly return to the starting position. Repeat __________ times. Complete this exercise __________ times per day.  STRENGTH - Forearm Pronators   Sit with your right / left forearm supported on a table, keeping your elbow below shoulder height. Rest your hand over the edge, palm up.  Gently grip a hammer or a soup ladle.  Without moving your elbow, slowly turn your palm and hand upward to a "thumbs-up" position.  Hold this position for __________ seconds. Slowly return to the starting position. Repeat __________ times. Complete this exercise __________ times per day.  Document Released: 09/10/2005 Document Revised: 12/03/2011 Document Reviewed: 12/23/2008 [facility] Patient Information  2015 [employer] [place]. This information is not intended to replace advice given to you by your health care provider. Make sure you discuss any questions you have with your health care provider.

## 2015-04-04 NOTE — Assessment & Plan Note (Signed)
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 We discussed icing regimen, home exercises, as well as proper lifting mechanics. We discussed how to avoid certain activities. Patient given a trial of topical anti-inflammatory set think will be beneficial. We discussed over-the-counter natural supplementation and proper dosing. Patient will try these different changes and come back and see me again in 3 weeks. Continued have difficulty we may need to consider injection.

## 2015-04-22 ENCOUNTER — Other Ambulatory Visit (INDEPENDENT_AMBULATORY_CARE_PROVIDER_SITE_OTHER): Payer: BLUE CROSS/BLUE SHIELD

## 2015-04-22 DIAGNOSIS — E039 Hypothyroidism, unspecified: Secondary | ICD-10-CM | POA: Diagnosis not present

## 2015-04-22 LAB — T3, FREE: T3, Free: 4.5 pg/mL — ABNORMAL HIGH (ref 2.3–4.2)

## 2015-04-22 LAB — TSH: TSH: 0.87 u[IU]/mL (ref 0.35–4.50)

## 2015-04-22 LAB — T4, FREE: Free T4: 0.9 ng/dL (ref 0.60–1.60)

## 2015-04-25 ENCOUNTER — Ambulatory Visit: Payer: BLUE CROSS/BLUE SHIELD | Admitting: Family Medicine

## 2015-07-20 ENCOUNTER — Ambulatory Visit (INDEPENDENT_AMBULATORY_CARE_PROVIDER_SITE_OTHER): Payer: BLUE CROSS/BLUE SHIELD | Admitting: Family Medicine

## 2015-07-20 ENCOUNTER — Other Ambulatory Visit (INDEPENDENT_AMBULATORY_CARE_PROVIDER_SITE_OTHER): Payer: BLUE CROSS/BLUE SHIELD

## 2015-07-20 ENCOUNTER — Ambulatory Visit (INDEPENDENT_AMBULATORY_CARE_PROVIDER_SITE_OTHER)
Admission: RE | Admit: 2015-07-20 | Discharge: 2015-07-20 | Disposition: A | Discharge status: Home / Self Care | Payer: BLUE CROSS/BLUE SHIELD | Source: Ambulatory Visit | Attending: Family Medicine | Admitting: Family Medicine

## 2015-07-20 ENCOUNTER — Encounter: Payer: Self-pay | Admitting: Family Medicine

## 2015-07-20 VITALS — BP 122/84 | HR 86 | Ht 65.0 in | Wt 155.0 lb

## 2015-07-20 DIAGNOSIS — M25421 Effusion, right elbow: Secondary | ICD-10-CM

## 2015-07-20 DIAGNOSIS — M25521 Pain in right elbow: Secondary | ICD-10-CM

## 2015-07-20 DIAGNOSIS — Z23 Encounter for immunization: Secondary | ICD-10-CM

## 2015-07-20 DIAGNOSIS — M25429 Effusion, unspecified elbow: Secondary | ICD-10-CM | POA: Insufficient documentation

## 2015-07-20 NOTE — Assessment & Plan Note (Signed)
Patient is a elbow effusion noted today. We discussed that there is a possible loose body early osteoarthritic changes. X-rays were ordered today. Discussed with patient about continuing the natural supplementation. Patient will continue to remain fairly active. We discussed that if pain is not seem to resolve a possible MRI will be necessary. Patient come back and see me again in 3 weeks for further evaluation and treatment.

## 2015-07-20 NOTE — Progress Notes (Signed)
Tawana ScaleZach Smith D.O. [facility] [address] [place], [place] [address] Phone: [phone] Subjective:      CC: right elbow pain follow up  OZH:[id number]:Subjective Ashley Allen is a 44 y.o. female coming in with complaint of patient is complaining of right elbow pain. Patient was seen previously and was diagnosed with more of a pronator syndrome. Patient given home exercises, bracing, icing and topical anti-inflammatories. Patient states she is made very minimal improvement. States lesser 5%. Has been doing the exercises fairly regularly as well as icing. States that the topical anti-inflammatory has been helpful. Denies any weakness. Patient does give a past medical history ago for a elbow dislocation of this elbow multiple decades ago.    Past Medical History  Diagnosis Date  . GERD (gastroesophageal reflux disease)   . Anxiety   . Chicken pox   . Depression   . Ulcer   . UTI (urinary tract infection)   . Thyroid nodule     benign   Past Surgical History  Procedure Laterality Date  . Breast enhancement surgery  2007  . Nasal sinus surgery  ?2010   Social History  Substance Use Topics  . Smoking status: Former Smoker -- 3 years    Types: Cigarettes  . Smokeless tobacco: None     Comment: previous social smoker   . Alcohol Use: Yes   Allergies  Allergen Reactions  . Polysporin [Bacitracin-Polymyxin B]   . Sulfonamide Derivatives     REACTION: Itching   Family History  Problem Relation Age of Onset  . Alcohol abuse Father   . Arthritis Mother   . Arthritis Father   . Uterine cancer Other   . Colon cancer Other   . Hyperlipidemia Mother   . Heart disease Father   . Hypertension Mother   . Hypertension Father   . Sudden death Paternal Grandmother   . Cancer Other     Aunts  . Emphysema Father     heavy smoker    Past medical history, social, surgical and family history all reviewed in electronic medical record.   Review of Systems: No headache, visual  changes, nausea, vomiting, diarrhea, constipation, dizziness, abdominal pain, skin rash, fevers, chills, night sweats, weight loss, swollen lymph nodes, body aches, joint swelling, muscle aches, chest pain, shortness of breath, mood changes.   Objective Blood pressure 122/84, pulse 86, height 5\' 5"  (1.651 m), weight 155 lb (70.308 kg), SpO2 98 %.  General: No apparent distress alert and oriented x3 mood and affect normal, dressed appropriately.  HEENT: Pupils equal, extraocular movements intact  Respiratory: Patient's speak in full sentences and does not appear short of breath  Cardiovascular: No lower extremity edema, non tender, no erythema  Skin: Warm dry intact with no signs of infection or rash on extremities or on axial skeleton.  Abdomen: Soft nontender  Neuro: Cranial nerves II through XII are intact, neurovascularly intact in all extremities with 2+ DTRs and 2+ pulses.  Lymph: No lymphadenopathy of posterior or anterior cervical chain or axillae bilaterally.  Gait normal with good balance and coordination.  MSK:  Non tender with full range of motion and good stability and symmetric strength and tone of shoulders,  wrist, hip, knee and ankles bilaterally.  Elbow:right Unremarkable to inspection. Range of motion full pronation, supination, flexion, extension. Patient does have pain with full flexion and full extension of the elbow Strength is full to all of the above directionsin with full pronation though noted. Stable to varus,  valgus stress. Negative moving valgus stress test. Normal tenderness over the lateral epicondylar region.Marland Kitchen Ulnar nerve does not sublux. Negative cubital tunnel Tinel's. Pain over the elbow joint laterally. Contralateral elbow unremarkable  Musculoskeletal ultrasound was performed and interpreted by Terrilee Files D.O.   Elbow: right Lateral epicondyle and common extensor tendon origin visualized.  No edema, effusions, or avulsions seen.. Radial head  unremarkable and located in annular ligament Medial epicondyle and common flexor tendon origin visualized.  No edema, effusions, or avulsions seen. Ulnar nerve in cubital tunnel unremarkable. Olecranon and triceps insertion visualized and unremarkable without edema, effusion, or avulsion.  No signs olecranon bursitis. Patient's able joint though does have small amounts of effusion noted today. Possible loose body also palpated. Power doppler signal normal.  IMPRESSION:  Possible right elbow effusion with loose body  Procedure: Real-time Ultrasound Guided Injection of right elbow joint Device: GE Logiq E  Ultrasound guided injection is preferred based studies that show increased duration, increased effect, greater accuracy, decreased procedural pain, increased response rate with ultrasound guided versus blind injection.  Verbal informed consent obtained.  Time-out conducted.  Noted no overlying erythema, induration, or other signs of local infection.  Skin prepped in a sterile fashion.  Local anesthesia: Topical Ethyl chloride.  With sterile technique and under real time ultrasound guidance:  median nerve visualized.  26g 5/8 inch needle inserted distal to proximal approach into area of insertion of the common extensor tendon. Pictures taken nfor needle placement. Patient did have injection of  1 cc of 0.5% Marcaine, and 1 cc of Kenalog 40 mg/dL. Completed without difficulty  Pain immediately resolved suggesting accurate placement of the medication.  Advised to call if fevers/chills, erythema, induration, drainage, or persistent bleeding.  Images permanently stored and available for review in the ultrasound unit.  Impression: Technically successful ultrasound guided injection.       Impression and Recommendations:     This case required medical decision making of moderate complexity.

## 2015-07-20 NOTE — Patient Instructions (Signed)
Duexis 3 times daily for 3 days Ice is your friend especially in 6 hours Xray downstairs today Start the exercises again in 48 hours.  Pennsaid twice daily See me again in 3 weeks.

## 2015-07-20 NOTE — Progress Notes (Signed)
Pre visit review using our clinic review tool, if applicable. No additional management support is needed unless otherwise documented below in the visit note. 

## 2015-10-10 ENCOUNTER — Encounter: Payer: Self-pay | Admitting: Family Medicine

## 2015-12-05 ENCOUNTER — Ambulatory Visit: Payer: BLUE CROSS/BLUE SHIELD | Admitting: Family Medicine

## 2015-12-07 ENCOUNTER — Ambulatory Visit (INDEPENDENT_AMBULATORY_CARE_PROVIDER_SITE_OTHER): Payer: Commercial Managed Care - PPO | Admitting: Family Medicine

## 2015-12-07 ENCOUNTER — Encounter: Payer: Self-pay | Admitting: Family Medicine

## 2015-12-07 ENCOUNTER — Other Ambulatory Visit: Payer: Self-pay | Admitting: Family Medicine

## 2015-12-07 VITALS — BP 108/80 | HR 79 | Temp 98.2°F | Ht 64.5 in | Wt 169.8 lb

## 2015-12-07 DIAGNOSIS — F39 Unspecified mood [affective] disorder: Secondary | ICD-10-CM

## 2015-12-07 DIAGNOSIS — Z0000 Encounter for general adult medical examination without abnormal findings: Secondary | ICD-10-CM | POA: Diagnosis not present

## 2015-12-07 DIAGNOSIS — R234 Changes in skin texture: Secondary | ICD-10-CM | POA: Diagnosis not present

## 2015-12-07 DIAGNOSIS — E039 Hypothyroidism, unspecified: Secondary | ICD-10-CM

## 2015-12-07 LAB — LIPID PANEL
Cholesterol: 191 mg/dL (ref 0–200)
HDL: 56.9 mg/dL (ref >39.00)
LDL Cholesterol: 108 mg/dL — ABNORMAL HIGH (ref 0–99)
NonHDL: 133.75
Total CHOL/HDL Ratio: 3
Triglycerides: 129 mg/dL (ref 0.0–149.0)
VLDL: 25.8 mg/dL (ref 0.0–40.0)

## 2015-12-07 LAB — BASIC METABOLIC PANEL
BUN: 7 mg/dL (ref 6–23)
CO2: 27 mEq/L (ref 19–32)
Calcium: 9.1 mg/dL (ref 8.4–10.5)
Chloride: 104 mEq/L (ref 96–112)
Creatinine, Ser: 0.59 mg/dL (ref 0.40–1.20)
GFR: 117.94 mL/min (ref >60.00)
Glucose, Bld: 100 mg/dL — ABNORMAL HIGH (ref 70–99)
Potassium: 4.3 mEq/L (ref 3.5–5.1)
Sodium: 137 mEq/L (ref 135–145)

## 2015-12-07 LAB — TSH: TSH: 0.62 u[IU]/mL (ref 0.35–4.50)

## 2015-12-07 LAB — HEMOGLOBIN A1C: Hgb A1c MFr Bld: 5.3 % (ref 4.6–6.5)

## 2015-12-07 MED ORDER — VENLAFAXINE HCL ER 75 MG PO CP24: 75.0000 mg | ORAL_CAPSULE | Freq: Every day | ORAL | Status: DC

## 2015-12-07 MED ORDER — FLUTICASONE PROPIONATE 50 MCG/ACT NA SUSP: NASAL | Status: DC

## 2015-12-07 NOTE — Patient Instructions (Addendum)
BEFORE YOU LEAVE: -schedule follow up appointment in 4-6 weeks -please sign for to obtain permission/records from Dr. Juliene PinaMody (gyn) and Dr. Thomasene LotJordon (derm) -labs  START the effexor and take once daily. Do not stop this medication suddenly. Follow up/Seek care immediately if worsening symptoms.  Vit D3 [phone] IU daily  Please continue counseling and consider increased frequency.  -We have ordered labs or studies at this visit. It can take up to 1-2 weeks for results and processing. We will contact you with instructions IF your results are abnormal. Normal results will be released to your [facility]. If you have not heard from us or can not find your results in [facility] in 2 weeks please contact our office.  We recommend the following healthy lifestyle measures: - eat a healthy whole foods diet consisting of regular small meals composed of vegetables, fruits, beans, nuts, seeds, healthy meats such as white chicken and fish and whole grains.  - avoid sweets, white starchy foods, fried foods, fast food, processed foods, sodas, red meet and other fattening foods.  - get a least 150-300 minutes of aerobic exercise per week.

## 2015-12-07 NOTE — Progress Notes (Signed)
HPI:  Here for CPE:  -Concerns and/or follow up today:   Depression and anxiety: -chronic, intermittent - worse over the winter -she feels this causes weight issues -symptoms: irritability, anxiety - generalized, occ panic attacks - stress about brother (alcoholic) -no SI, depressed mood, psychosis, manic symptoms -getting counseling -wants to consider medication again -feels safe  Thyroid nodules, hypothyroid: -referred to Dr. Elvera Lennox per her request last visit - she advised f/u summer 2017 -was seeing integrative ob doc in [place] -reports had dx thyroid nodules in sept 2015 in [facility], s/p biopsy, told need q 6 month USs -has been on and off thyroid medications in the past -wants to check thyroid with labs as has had difficulty with weight  -Diet: variety of foods, balance and well rounded, larger portion sizes and admits snacks on the wrong foods, especially wen stressed  -Exercise: walks 3 miles 2-3 days per week  -Taking folic acid, vitamin D or calcium: no  -Diabetes and Dyslipidemia Screening: FASTING for labs  -Hx of HTN: no  -Vaccines: UTD  -pap history: hx abnormal pap in 2009 per her report, normal in 2013, 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 Advised gyn eval given hx abnormal pap and parakeratosis. Reports saw Dr. Juliene Pina and did pap and mammo less then 1 year ago, has gyn physical for this summer.  -sexual activity: yes, female partner, no new partners  -wants STI testing (Hep C if born [birth year]): no  -FH breast, colon or ovarian ca: see FH Last mammogram:n/a Last colon cancer screening: n/a  Breast Ca Risk Assessment: -breast health done with gyn   -Alcohol, Tobacco, drug use: see social history  Review of Systems - no fevers, unintentional weight loss, vision loss, hearing loss, chest pain, sob, hemoptysis, melena, hematochezia, hematuria, genital discharge, changing or concerning skin lesions, bleeding, bruising, loc, thoughts of self harm or  SI  Past Medical History  Diagnosis Date  . GERD (gastroesophageal reflux disease)   . Anxiety   . Chicken pox   . Depression   . Ulcer   . UTI (urinary tract infection)   . Thyroid nodule     benign    Past Surgical History  Procedure Laterality Date  . Breast enhancement surgery  2007  . Nasal sinus surgery  ?2010    Family History  Problem Relation Age of Onset  . Alcohol abuse Father   . Arthritis Mother   . Arthritis Father   . Uterine cancer Other   . Colon cancer Other   . Hyperlipidemia Mother   . Heart disease Father   . Hypertension Mother   . Hypertension Father   . Sudden death Paternal Grandmother   . Cancer Other     Aunts  . Emphysema Father     heavy smoker    Social History   Social History  . Marital Status: Single    Spouse Name: N/A  . Number of Children: 0  . Years of Education: 16   Occupational History  . [employer]   Social History Main Topics  . Smoking status: Former Smoker -- 3 years    Types: Cigarettes  . Smokeless tobacco: None     Comment: previous social smoker   . Alcohol Use: Yes  . Drug Use: No  . Sexual Activity: Not Asked   Other Topics Concern  . None   Social History Narrative   Regular exercise-yes   Caffeine Use-yes     Current outpatient prescriptions:  .  Cholecalciferol (VITAMIN D-3 PO), Take by mouth., Disp: , Rfl:  .  fluticasone (FLONASE) 50 MCG/ACT nasal spray, USE 1 SPRAY INTO EACH NOSTRIL TWICE DAILY, Disp: 16 g, Rfl: 6 .  IODINE EX, Take by mouth., Disp: , Rfl:  .  levonorgestrel (MIRENA) 20 MCG/24HR IUD, 1 each by Intrauterine route once., Disp: , Rfl:  .  MAGNESIUM CITRATE PO, Take by mouth., Disp: , Rfl:  .  NON FORMULARY, Inflavonoid Intensive Care, Disp: , Rfl:  .  omeprazole (PRILOSEC) 40 MG capsule, Take 40 mg by mouth daily as needed. , Disp: , Rfl:  .  valACYclovir (VALTREX) 1000 MG tablet, TAKE 1 TABLET BY MOUTH EVERY DAY AS NEEDED, Disp: 10 tablet, Rfl: 0 .  venlafaxine XR  (EFFEXOR XR) 75 MG 24 hr capsule, Take 1 capsule (75 mg total) by mouth daily with breakfast., Disp: 30 capsule, Rfl: 1  EXAM:  Filed Vitals:   12/07/15 0920  BP: 108/80  Pulse: 79  Temp: 98.2 F (36.8 C)    GENERAL: vitals reviewed and listed below, alert, oriented, appears well hydrated and in no acute distress  HEENT: head atraumatic, PERRLA, normal appearance of eyes, ears, nose and mouth. moist mucus membranes.  NECK: supple, no masses or lymphadenopathy  LUNGS: clear to auscultation bilaterally, no rales, rhonchi or wheeze  CV: HRRR, no peripheral edema or cyanosis, normal pedal pulses  BREAST: declined, does with gyn ABDOMEN: bowel sounds normal, soft, non tender to palpation, no masses, no rebound or guarding  GU: declined, does with gyn  SKIN: no rash or abnormal lesions on exposed skin, declined skin check as did with dermatologist last week  MS: normal gait, moves all extremities normally  NEURO: CN II-XII grossly intact, normal muscle strength and sensation to light touch on extremities  PSYCH: normal affect, pleasant and cooperative  ASSESSMENT AND PLAN:  Discussed the following assessment and plan:  Visit for preventive health examination - Plan: Lipid Panel, Hemoglobin A1c -Discussed and advised all [place] preventive services health task force level A and B recommendations for age, sex and risks.  -Advised at least 150 minutes of exercise per week and a healthy diet low in saturated fats and sweets and consisting of fresh fruits and vegetables, lean meats such as fish and white chicken and whole grains.  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 Juliene PinaMody in gyn -advised assistant to obtain records   Orders Placed This Encounter  Procedures  . Lipid Panel  . Hemoglobin A1c  . TSH  . Basic metabolic panel    Patient advised to return to clinic immediately if symptoms worsen or persist or new concerns.  Patient Instructions  BEFORE YOU LEAVE: -schedule follow up appointment in 4-6 weeks -please sign for to obtain permission/records from Dr. Juliene PinaMody (gyn) and Dr. Thomasene LotJordon (derm) -labs  START the effexor and take once daily. Do not stop this medication suddenly. Follow up/Seek care immediately if worsening symptoms.  Vit D3 [phone] IU daily  Please continue counseling and consider increased frequency.  -We have ordered labs  or studies at this visit. It can take up to 1-2 weeks for results and processing. We will contact you with instructions IF your results are abnormal. Normal results will be released to your [facility]. If you have not heard from [place] or can not find your results in [facility] in 2 weeks please contact our office.  We recommend the following healthy lifestyle measures: - eat a healthy whole foods diet consisting of regular small meals composed of vegetables, fruits, beans, nuts, seeds, healthy meats such as white chicken and fish and whole grains.  - avoid sweets, white starchy foods, fried foods, fast food, processed foods, sodas, red meet and other fattening foods.  - get a least 150-300 minutes of aerobic exercise per week.           No Follow-up on file.  Kriste Basque R.

## 2015-12-07 NOTE — Addendum Note (Signed)
Addended by: Terressa KoyanagiKIM, HANNAH R on: 12/07/2015 10:03 AM   Modules accepted: Orders

## 2015-12-07 NOTE — Progress Notes (Signed)
Pre visit review using our clinic review tool, if applicable. No additional management support is needed unless otherwise documented below in the visit note. 

## 2015-12-12 ENCOUNTER — Ambulatory Visit: Payer: BLUE CROSS/BLUE SHIELD | Admitting: Family Medicine

## 2016-01-18 ENCOUNTER — Ambulatory Visit: Payer: Commercial Managed Care - PPO | Admitting: Family Medicine

## 2016-01-19 ENCOUNTER — Encounter: Payer: Self-pay | Admitting: Family Medicine

## 2016-01-19 ENCOUNTER — Ambulatory Visit (INDEPENDENT_AMBULATORY_CARE_PROVIDER_SITE_OTHER): Payer: Commercial Managed Care - PPO | Admitting: Family Medicine

## 2016-01-19 VITALS — BP 118/80 | HR 86 | Temp 98.0°F | Ht 64.5 in | Wt 167.9 lb

## 2016-01-19 DIAGNOSIS — F320 Major depressive disorder, single episode, mild: Secondary | ICD-10-CM

## 2016-01-19 DIAGNOSIS — F411 Generalized anxiety disorder: Secondary | ICD-10-CM

## 2016-01-19 MED ORDER — VENLAFAXINE HCL ER 150 MG PO CP24: 150.0000 mg | ORAL_CAPSULE | Freq: Every day | ORAL | Status: DC

## 2016-01-19 NOTE — Progress Notes (Signed)
HPI:  Follow up:  Depression and anxiety: -started effexor 12/07/2015 - reports 50% reduction in symptoms most days, no panic attacks any longer, no side effects, feel needs to increase dose a little -chronic, intermittent - worse over the winter -symptoms: irritability, anxiety - generalized, occ panic attacks initially - stress about brother (alcoholic) -no SI, depressed mood, psychosis, manic symptoms -getting counseling   ROS: See pertinent positives and negatives per HPI.  Past Medical History  Diagnosis Date  . GERD (gastroesophageal reflux disease)   . Anxiety   . Chicken pox   . Depression   . Ulcer   . UTI (urinary tract infection)   . Thyroid nodule     benign    Past Surgical History  Procedure Laterality Date  . Breast enhancement surgery  2007  . Nasal sinus surgery  ?2010    Family History  Problem Relation Age of Onset  . Alcohol abuse Father   . Arthritis Mother   . Arthritis Father   . Uterine cancer Other   . Colon cancer Other   . Hyperlipidemia Mother   . Heart disease Father   . Hypertension Mother   . Hypertension Father   . Sudden death Paternal Grandmother   . Cancer Other     Aunts  . Emphysema Father     heavy smoker    Social History   Social History  . Marital Status: Single    Spouse Name: N/A  . Number of Children: 0  . Years of Education: 16   Occupational History  . [employer]   Social History Main Topics  . Smoking status: Former Smoker -- 3 years    Types: Cigarettes  . Smokeless tobacco: None     Comment: previous social smoker   . Alcohol Use: Yes  . Drug Use: No  . Sexual Activity: Not Asked   Other Topics Concern  . None   Social History Narrative   Regular exercise-yes   Caffeine Use-yes     Current outpatient prescriptions:  .  Cholecalciferol (VITAMIN D-3 PO), Take by mouth., Disp: , Rfl:  .  fluticasone (FLONASE) 50 MCG/ACT nasal spray, USE 1 SPRAY INTO EACH NOSTRIL TWICE DAILY, Disp: 16  g, Rfl: 6 .  IODINE EX, Take by mouth., Disp: , Rfl:  .  levonorgestrel (MIRENA) 20 MCG/24HR IUD, 1 each by Intrauterine route once., Disp: , Rfl:  .  MAGNESIUM CITRATE PO, Take by mouth., Disp: , Rfl:  .  NON FORMULARY, Inflavonoid Intensive Care, Disp: , Rfl:  .  omeprazole (PRILOSEC) 40 MG capsule, Take 40 mg by mouth daily as needed. , Disp: , Rfl:  .  valACYclovir (VALTREX) 500 MG tablet, TAKE 1 TABLET BY MOUTH EVERY DAY, Disp: 30 tablet, Rfl: 3 .  venlafaxine XR (EFFEXOR-XR) 150 MG 24 hr capsule, Take 1 capsule (150 mg total) by mouth daily with breakfast., Disp: 30 capsule, Rfl: 3  EXAM:  Filed Vitals:   01/19/16 1354  BP: 118/80  Pulse: 86  Temp: 98 F (36.7 C)    Body mass index is 28.39 kg/(m^2).  GENERAL: vitals reviewed and listed above, alert, oriented, appears well hydrated and in no acute distress  HEENT: atraumatic, conjunttiva clear, no obvious abnormalities on inspection of external nose and ears, normal appearance of ear canals and TMs, clear nasal congestion, mild post oropharyngeal erythema with PND, no tonsillar edema or exudate, no sinus TTP  NECK: no obvious masses on inspection  LUNGS: clear to auscultation  bilaterally, no wheezes, rales or rhonchi, good air movement  CV: HRRR, no peripheral edema  MS: moves all extremities without noticeable abnormality  PSYCH: pleasant and cooperative, no obvious depression or anxiety  ASSESSMENT AND PLAN:  Discussed the following assessment and plan:  GAD (generalized anxiety disorder)  Mild single current episode of major depressive disorder (HCC)  -doing better, good response to effexor and tolerating well -discussed risks/benefits of the increased dose she requested and she wishes to increase -follow up in 4-6 weeks -Patient advised to return or notify a doctor immediately if symptoms worsen or persist or new concerns arise.  Patient Instructions  BEFORE YOU LEAVE: -schedule follow up in 6  weeks  Increase effexor to 150 daily  Follow up sooner if worsening, new symptoms or concerns.     [name], Remmi Armenteros R.

## 2016-01-19 NOTE — Progress Notes (Signed)
Pre visit review using our clinic review tool, if applicable. No additional management support is needed unless otherwise documented below in the visit note. 

## 2016-01-19 NOTE — Patient Instructions (Signed)
BEFORE YOU LEAVE: -schedule follow up in 6 weeks  Increase effexor to 150 daily  Follow up sooner if worsening, new symptoms or concerns.

## 2016-02-17 ENCOUNTER — Encounter: Payer: Self-pay | Admitting: Family Medicine

## 2016-02-21 NOTE — Telephone Encounter (Signed)
Pt following up on refill request.  Pt wants to go back down to venlafaxine XR (EFFEXOR-XR) 75 MG 24 hr capsule Advised pt dr Selena Battenkim is out today.  [facility]

## 2016-02-22 ENCOUNTER — Other Ambulatory Visit: Payer: Self-pay | Admitting: Family Medicine

## 2016-02-22 MED ORDER — VENLAFAXINE HCL ER 75 MG PO CP24: 75.0000 mg | ORAL_CAPSULE | Freq: Every day | ORAL | Status: DC

## 2016-02-27 ENCOUNTER — Ambulatory Visit: Payer: Commercial Managed Care - PPO | Admitting: Family Medicine

## 2016-03-01 ENCOUNTER — Encounter: Payer: Self-pay | Admitting: Internal Medicine

## 2016-03-01 ENCOUNTER — Ambulatory Visit (INDEPENDENT_AMBULATORY_CARE_PROVIDER_SITE_OTHER): Payer: Commercial Managed Care - PPO | Admitting: Internal Medicine

## 2016-03-01 DIAGNOSIS — E042 Nontoxic multinodular goiter: Secondary | ICD-10-CM

## 2016-03-01 DIAGNOSIS — Z8639 Personal history of other endocrine, nutritional and metabolic disease: Secondary | ICD-10-CM | POA: Diagnosis not present

## 2016-03-01 NOTE — Progress Notes (Signed)
Pre visit review using our clinic tool,if applicable. No additional management support is needed unless otherwise documented below in the visit note.  

## 2016-03-01 NOTE — Progress Notes (Addendum)
Subjective:     Patient ID: Ashley Allen, female   DOB: [birth year], 45 y.o.   MRN: [id number]  HPI Ashley Allen is a 45 y.o.y/o woman returning for follow-up for h/o hypothyroidism and thyroid nodules. Last visit was 1 year ago.   Hypothyroidism: Reviewed hx: She was started on Synthroid by her previous endocrinologist in 2008, for goiter, but she does not remember having blood work or thyroid U/S done, so it is unclear whether this was started for hypothyroidism or in an attempt to decrease the size of a thyroid nodule that was palpated at that time.   She took the medication for about 6 months then, then stopped it 8 years ago per [facility] recs. She had normal TSH (0.39 on 07/03/2012 - LLN 0.35) despite being off Synthroid. She did not feel different after starting or after stopping the medication.   At her visit with her PCP, in 08/18/2012, she had multiple vague complaints: fatigue, wt gain, constipation, feeling cold. She was wondering whether she needed to restart Synthroid at that time so she was referred to endocrinology.  At last visits, we checked thyroid labs >> all normal, I therefore, did not recommend to start thyroid medication. Lab Results  Component Value Date   TSH 0.62 12/07/2015   TSH 0.87 04/22/2015   TSH 0.85 03/03/2015   TSH 0.921 08/28/2012   TSH 0.39 07/03/2012   FREET4 0.90 04/22/2015   FREET4 0.81 03/03/2015   FREET4 1.25 08/28/2012   Component     Latest Ref Rng 08/28/2012  TSI     <140 % baseline 75  Thyroperoxidase Ab SerPl-aCnc     <35.0 IU/mL 26.3   [place] started to see a naturopath, who started Cytomel 10 mg bid in 07/2014 >> she felt well, but did not know whether this is mandatory medication so she tapered it off, now completely off for last 2 weeks.   I reviewed thyroid labs from her naturopath: 11/03/2014, while on Cytomel: TSH 0.02 (0.4-4.5), free T4 0.9 (0.8-1.8), free T3 4.2 (2.3-4.2), reverse T3 11 (8-25), serum iodine 44  (50-109). A note was made that the thyroid status was "perfect".  She c/o: - + fatigue for last 2 weeks, and sleepiness after she started Effexor - + weight gain   Thyroid nodules:  Patient had a thyroid U/S (05/2014): 2 thyroid nodules:  Isoechoic nodule at right superior pole and isoechoicnodule at right inferior. Normal vascularity, no calcification.   Right upper: 1.6 x 1.0 x 1.4 cm, right lower: 2.3 x 1.8 x 1.9 cm    FNA (06/21/2014): 1. Thyroid nodule, right upper, FNA (smears):  - Bethesda category: 2Benign.  - Benign follicular nodule, consistent with colloid nodule.    2. Thyroid nodule, right lower, FNA (smears):  - Bethesda category: 2Benign.  - Benign follicular nodule, consistent with colloid nodule.   Of note, pt's mother and sister has Graves ds. Maternal cousin had thyroid nodules.  I reviewed pt's medications, allergies, PMH, social hx, family hx, and changes were documented in the history of present illness. Otherwise, unchanged from my initial visit note:  Past Medical History  Diagnosis Date  . GERD (gastroesophageal reflux disease)   . Anxiety   . Chicken pox   . Depression   . Ulcer   . UTI (urinary tract infection)   . Thyroid nodule     benign   Past Surgical History  Procedure Laterality Date  . Breast enhancement surgery  2007  .  Nasal sinus surgery  ?2010   History   Social History  . Marital Status: Single    Spouse Name: N/A    Number of Children: 0  . Years of Education: 66, college   Occupational History  . Sales [employer]   Social History Main Topics  . Smoking status: Former Games developer  . Smokeless tobacco: Not on file     Comment: previous social smoker   . Alcohol Use: Yes  . Drug Use: No   Current Outpatient Prescriptions on File Prior to Visit  Medication Sig Dispense Refill  . Cholecalciferol (VITAMIN D-3 PO) Take by mouth.    . fluticasone (FLONASE) 50 MCG/ACT nasal spray USE 1 SPRAY INTO EACH NOSTRIL  TWICE DAILY 16 g 6  . IODINE EX Take by mouth.    . levonorgestrel (MIRENA) 20 MCG/24HR IUD 1 each by Intrauterine route once.    Marland Kitchen MAGNESIUM CITRATE PO Take by mouth.    . NON FORMULARY Inflavonoid Intensive Care    . omeprazole (PRILOSEC) 40 MG capsule Take 40 mg by mouth daily as needed.     . valACYclovir (VALTREX) 500 MG tablet TAKE 1 TABLET BY MOUTH EVERY DAY 30 tablet 3  . venlafaxine XR (EFFEXOR-XR) 75 MG 24 hr capsule Take 1 capsule (75 mg total) by mouth daily with breakfast. 30 capsule 3   No current facility-administered medications on file prior to visit.   Allergies  Allergen Reactions  . Polysporin [Bacitracin-Polymyxin B]   . Sulfonamide Derivatives     REACTION: Itching   Family History  Problem Relation Age of Onset  . Alcohol abuse Father   . Arthritis Mother   . Arthritis Father   . Uterine cancer Other   . Colon cancer Other   . Hyperlipidemia Mother   . Heart disease Father   . Hypertension Mother   . Hypertension Father   . Sudden death Paternal Grandmother   . Cancer Other     Aunts  . Emphysema Father     heavy smoker   Review of Systems per HPI+  Constitutional: no subjective hypo- or hyper-thermia Eyes: no blurry vision, no xerophthalmia ENT: no sore throat, no nodules palpated in throat, no dysphagia/odynophagia, no hoarseness Cardiovascular: no CP/SOB/palpitations/leg swelling Respiratory: no cough/SOB Gastrointestinal: no N/V/D/C Musculoskeletal: no muscle/joint aches Skin: no rashes Neurological: no tremors/numbness/tingling/dizziness  Objective:   Physical Exam BP 131/96 mmHg  Pulse 86  Temp(Src) 98 F (36.7 C) (Oral)  Ht 5' 4.5" (1.638 m)  Wt 168 lb (76.204 kg)  BMI 28.40 kg/m2  SpO2 98% Body mass index is 28.4 kg/(m^2). Wt Readings from Last 3 Encounters:  03/01/16 168 lb (76.204 kg)  01/19/16 167 lb 14.4 oz (76.159 kg)  12/07/15 169 lb 12.8 oz (77.021 kg)   Constitutional: overweight, in NAD Eyes: PERRLA, EOMI, no  exophthalmos ENT: moist mucous membranes, thyroid lobe R larger than the L, no clear nodule palpated, no cervical lymphadenopathy Cardiovascular: RRR, No MRG Respiratory: CTA B Gastrointestinal: abdomen soft, NT, ND, BS+ Musculoskeletal: no deformities, strength intact in all 4 Skin: moist, warm, no rashes Neurological: no tremor with outstretched hands, DTR normal in all 4     Assessment:     1. H/o Hypothyroidism - now euthyroid  2. 2 thyroid nodules    Plan:     1. H/o Hypothyroidism  - We reviewed together the labs obtained since 2013, which were all normal off thyroid hormones.  - reviewed TFTs from last 4 years >> all  normal, including the TSH 3 mo ago >> will not repeat today  2. Thyroid nodules - Patient had 2 right thyroid nodules on ultrasound obtained on 05/2014. Images were not available, however, I reviewed the FNA reports showing benign pathology for both nodules. - We discussed that without the high risk for cancer (previous exposure to radiation or family history of thyroid cancer) and with benign biopsies, her risk of thyroid cancer are low. There is no need to repeat thyroid ultrasound every 6 months, but we will repeat one in 2 years from the previous, in 05/2016. If there is a change in the appearance of the nodules, we may need to repeat the biopsies. Patient agrees with the plan >> she will call me in 05/2016 so I can order it. Return in about 1 year (around 03/01/2017).    US Soft Tissue Head/Neck   Details   Reading Physician Reading Date Result Priority  Irish Lack, MD 07/10/2016   Narrative    CLINICAL DATA: Other. History of thyroid nodules and prior biopsy in [place].  EXAM: THYROID ULTRASOUND  TECHNIQUE: Ultrasound examination of the thyroid gland and adjacent soft tissues was performed.  COMPARISON: No prior thyroid ultrasounds are available for comparison.  FINDINGS: Parenchymal Echotexture: Mildly heterogenous  Estimated total  number of nodules >/= 1 cm: 1  Number of spongiform nodules >/= 2 cm not described below (TR1): 0  Number of mixed cystic and solid nodules >/= 1.5 cm not described below (TR2): 0  _________________________________________________________  Isthmus: 0.5 cm  No discrete nodules are identified within the thyroid isthmus.  _________________________________________________________  Right lobe: 6.5 x 2.1 x 2.3 cm  Nodule # 1:  Location: Right; Mid  Size: More discrete nodular area measures 1.4 x 1.0 x 1.1 cm. Additional heterogeneous tissue present when included conglomerate nodular region measures as much as 3.0 x 1.1 x 2.1 cm.  Composition: solid/almost completely solid (2)  Echogenicity: isoechoic (1)  Shape: not taller-than-wide (0)  Margins: smooth (0)  Echogenic foci: none (0)  ACR TI-RADS total points: 3.  ACR TI-RADS risk category: TR3 (3 points).  ACR TI-RADS recommendations:  *Given size (>/= 1.5 - 2.4 cm) and appearance, a follow-up ultrasound in 1 year should be considered based on TI-RADS criteria.  _________________________________________________________  Left lobe: 5.5 x 1.2 x 2.0 cm  No discrete nodules are identified within the left lobe of the thyroid.  IMPRESSION: Nodular solid region within the right lobe of the thyroid gland. This nodular abnormality is very difficult to measure due to adjacent heterogeneous tissue. Discrete nodule may measure is little as 1.4 cm or as much as 3 cm. Correlation suggested with prior imaging and biopsy results. Given size discrepancy, the best approach would likely be a follow-up ultrasound in 1 year.  The above is in keeping with the ACR TI-RADS recommendations - J Am Coll Radiol 2017;14:587-595.   Electronically Signed By: Irish Lack M.D. On: 07/10/2016 08:51        Would suggest to obtain another thyroid ultrasound in a year to compare.

## 2016-03-01 NOTE — Patient Instructions (Signed)
Please call me in 3 months to schedule the thyroid U/S.  Please return in 1 year.

## 2016-03-02 ENCOUNTER — Ambulatory Visit: Payer: BLUE CROSS/BLUE SHIELD | Admitting: Internal Medicine

## 2016-04-02 ENCOUNTER — Encounter: Payer: Self-pay | Admitting: Family Medicine

## 2016-04-24 DIAGNOSIS — R87610 Atypical squamous cells of undetermined significance on cytologic smear of cervix (ASC-US): Secondary | ICD-10-CM | POA: Diagnosis not present

## 2016-04-24 DIAGNOSIS — Z01419 Encounter for gynecological examination (general) (routine) without abnormal findings: Secondary | ICD-10-CM | POA: Diagnosis not present

## 2016-05-30 DIAGNOSIS — R87610 Atypical squamous cells of undetermined significance on cytologic smear of cervix (ASC-US): Secondary | ICD-10-CM | POA: Diagnosis not present

## 2016-06-06 DIAGNOSIS — Z79899 Other long term (current) drug therapy: Secondary | ICD-10-CM | POA: Diagnosis not present

## 2016-06-06 DIAGNOSIS — T63301A Toxic effect of unspecified spider venom, accidental (unintentional), initial encounter: Secondary | ICD-10-CM | POA: Diagnosis not present

## 2016-06-06 DIAGNOSIS — S20162A Insect bite (nonvenomous) of breast, left breast, initial encounter: Secondary | ICD-10-CM | POA: Diagnosis not present

## 2016-07-02 ENCOUNTER — Other Ambulatory Visit: Payer: Self-pay | Admitting: Family Medicine

## 2016-07-02 ENCOUNTER — Ambulatory Visit (INDEPENDENT_AMBULATORY_CARE_PROVIDER_SITE_OTHER): Payer: BLUE CROSS/BLUE SHIELD | Admitting: Family Medicine

## 2016-07-02 ENCOUNTER — Encounter: Payer: Self-pay | Admitting: Family Medicine

## 2016-07-02 VITALS — BP 118/84 | HR 88 | Temp 98.3°F | Ht 64.5 in | Wt 165.1 lb

## 2016-07-02 DIAGNOSIS — Z23 Encounter for immunization: Secondary | ICD-10-CM

## 2016-07-02 DIAGNOSIS — K219 Gastro-esophageal reflux disease without esophagitis: Secondary | ICD-10-CM | POA: Diagnosis not present

## 2016-07-02 DIAGNOSIS — F3342 Major depressive disorder, recurrent, in full remission: Secondary | ICD-10-CM | POA: Diagnosis not present

## 2016-07-02 MED ORDER — VENLAFAXINE HCL ER 75 MG PO CP24: 75.0000 mg | ORAL_CAPSULE | Freq: Every day | ORAL | 1 refills | Status: DC

## 2016-07-02 MED ORDER — OMEPRAZOLE 20 MG PO CPDR: 20.0000 mg | DELAYED_RELEASE_CAPSULE | Freq: Every day | ORAL | 3 refills | Status: DC

## 2016-07-02 NOTE — Progress Notes (Signed)
HPI:  Follow up:  Depression and anxiety: -started effexor 12/07/2015 - increased last visit and pt did not follow up as advised - reports she tried to tape roff as was doing better, but had to restart as had depressed mood and frequent crying -now doing well on 75mg  daily -chronic, intermittent - worse over the winter -no SI, depressed mood, psychosis, manic symptoms -getting counseling  GERD: -uses prilosec 40mg  prn -hx ulcer, treated -has occ reflux if eats the wrong thing  Thyroid nodules: -reports as set up follow up for this - sees endo  ROS: See pertinent positives and negatives per HPI.  Past Medical History:  Diagnosis Date  . Anxiety   . Chicken pox   . Depression   . GERD (gastroesophageal reflux disease)   . Thyroid nodule    benign  . Ulcer (HCC)   . UTI (urinary tract infection)     Past Surgical History:  Procedure Laterality Date  . BREAST ENHANCEMENT SURGERY  2007  . NASAL SINUS SURGERY  ?2010    Family History  Problem Relation Age of Onset  . Alcohol abuse Father   . Arthritis Mother   . Arthritis Father   . Uterine cancer Other   . Colon cancer Other   . Hyperlipidemia Mother   . Heart disease Father   . Hypertension Mother   . Hypertension Father   . Sudden death Paternal Grandmother   . Cancer Other     Aunts  . Emphysema Father     heavy smoker    Social History   Social History  . Marital status: Single    Spouse name: N/A  . Number of children: 0  . Years of education: 78   Occupational History  . [employer]   Social History Main Topics  . Smoking status: Former Smoker    Years: 3.00    Types: Cigarettes  . Smokeless tobacco: None     Comment: previous social smoker   . Alcohol use Yes  . Drug use: No  . Sexual activity: Not Asked   Other Topics Concern  . None   Social History Narrative   Regular exercise-yes   Caffeine Use-yes     Current Outpatient Prescriptions:  .  Cholecalciferol (VITAMIN  D-3 PO), Take by mouth., Disp: , Rfl:  .  fluticasone (FLONASE) 50 MCG/ACT nasal spray, USE 1 SPRAY INTO EACH NOSTRIL TWICE DAILY, Disp: 16 g, Rfl: 6 .  IODINE EX, Take by mouth., Disp: , Rfl:  .  levonorgestrel (MIRENA) 20 MCG/24HR IUD, 1 each by Intrauterine route once., Disp: , Rfl:  .  MAGNESIUM CITRATE PO, Take by mouth., Disp: , Rfl:  .  NON FORMULARY, Inflavonoid Intensive Care, Disp: , Rfl:  .  valACYclovir (VALTREX) 500 MG tablet, TAKE 1 TABLET BY MOUTH EVERY DAY, Disp: 30 tablet, Rfl: 3 .  venlafaxine XR (EFFEXOR-XR) 75 MG 24 hr capsule, Take 1 capsule (75 mg total) by mouth daily with breakfast., Disp: 90 capsule, Rfl: 1 .  omeprazole (PRILOSEC) 20 MG capsule, Take 1 capsule (20 mg total) by mouth daily., Disp: 30 capsule, Rfl: 3  EXAM:  Vitals:   07/02/16 1612  BP: 118/84  Pulse: 88  Temp: 98.3 F (36.8 C)    Body mass index is 27.9 kg/m.  GENERAL: vitals reviewed and listed above, alert, oriented, appears well hydrated and in no acute distress  HEENT: atraumatic, conjunttiva clear, no obvious abnormalities on inspection of external nose and ears  NECK: no obvious masses on inspection  LUNGS: clear to auscultation bilaterally, no wheezes, rales or rhonchi, good air movement  CV: HRRR, no peripheral edema  MS: moves all extremities without noticeable abnormality  PSYCH: pleasant and cooperative, no obvious depression or anxiety  ASSESSMENT AND PLAN:  Discussed the following assessment and plan:  Recurrent major depressive disorder, in full remission (HCC)  Gastroesophageal reflux disease, esophagitis presence not specified  -meds refilled -lower dose prilosec advised -lifestyle recs -CPE in march -assistant reviewed/advised out of date HM measures -Patient advised to return or notify a doctor immediately if symptoms worsen or persist or new concerns arise.  Patient Instructions  BEFORE YOU LEAVE: -follow up: physical in March -flu shot  Try lower  dose prilosec.  We recommend the following healthy lifestyle for LIFE: 1) Small portions.   2) Eat a healthy clean diet.  * Tip: Avoid (less then 1 serving per week): processed foods, sweets, sweetened drinks, white starches (rice, flour, bread, potatoes, pasta, etc), red meat, fast foods, butter  *Tip: CHOOSE instead   * 5-9 servings per day of fresh or frozen fruits and vegetables (but not corn, potatoes, bananas, canned or dried fruit)   *nuts and seeds, beans   *olives and olive oil   *small portions of lean meats such as fish and white chicken    *small portions of whole grains  3)Get at least 150 minutes of sweaty aerobic exercise per week.  4)Reduce stress - consider counseling, meditation and relaxation to balance other aspects of your life.     [name], Delbert Darley R., DO

## 2016-07-02 NOTE — Patient Instructions (Signed)
BEFORE YOU LEAVE: -follow up: physical in March -flu shot  Try lower dose prilosec.  We recommend the following healthy lifestyle for LIFE: 1) Small portions.   2) Eat a healthy clean diet.  * Tip: Avoid (less then 1 serving per week): processed foods, sweets, sweetened drinks, white starches (rice, flour, bread, potatoes, pasta, etc), red meat, fast foods, butter  *Tip: CHOOSE instead   * 5-9 servings per day of fresh or frozen fruits and vegetables (but not corn, potatoes, bananas, canned or dried fruit)   *nuts and seeds, beans   *olives and olive oil   *small portions of lean meats such as fish and white chicken    *small portions of whole grains  3)Get at least 150 minutes of sweaty aerobic exercise per week.  4)Reduce stress - consider counseling, meditation and relaxation to balance other aspects of your life.

## 2016-07-02 NOTE — Progress Notes (Signed)
Pre visit review using our clinic review tool, if applicable. No additional management support is needed unless otherwise documented below in the visit note. 

## 2016-07-04 ENCOUNTER — Telehealth: Payer: Self-pay | Admitting: Internal Medicine

## 2016-07-04 NOTE — Telephone Encounter (Signed)
Patient stated she was due for a ultrasound of her Thyroid.

## 2016-07-05 ENCOUNTER — Other Ambulatory Visit: Payer: Self-pay | Admitting: Internal Medicine

## 2016-07-05 DIAGNOSIS — E042 Nontoxic multinodular goiter: Secondary | ICD-10-CM

## 2016-07-05 NOTE — Telephone Encounter (Signed)
I ordered it

## 2016-07-09 ENCOUNTER — Ambulatory Visit
Admission: RE | Admit: 2016-07-09 | Discharge: 2016-07-09 | Disposition: A | Discharge status: Home / Self Care | Payer: BLUE CROSS/BLUE SHIELD | Source: Ambulatory Visit | Attending: Internal Medicine | Admitting: Internal Medicine

## 2016-07-09 DIAGNOSIS — E042 Nontoxic multinodular goiter: Secondary | ICD-10-CM | POA: Diagnosis not present

## 2016-09-23 ENCOUNTER — Other Ambulatory Visit: Payer: Self-pay | Admitting: Family Medicine

## 2016-10-11 ENCOUNTER — Encounter: Payer: Self-pay | Admitting: Family Medicine

## 2016-10-16 ENCOUNTER — Other Ambulatory Visit: Payer: Self-pay | Admitting: Family Medicine

## 2016-10-16 MED ORDER — VENLAFAXINE HCL ER 150 MG PO CP24: 150.0000 mg | ORAL_CAPSULE | Freq: Every day | ORAL | 1 refills | Status: DC

## 2016-10-25 ENCOUNTER — Other Ambulatory Visit: Payer: Self-pay | Admitting: Family Medicine

## 2016-12-04 NOTE — Progress Notes (Signed)
HPI:  Here for CPE:  -Concerns and/or follow up today:  Sees Dr. Juliene Pina for mammo, pap, pelvic - all done in last year and normal per her report. Sees dermatologist for skin exam - done and normal.  Depression and anxiety: -started effexor 12/07/2015 -doing well now, wishes to continue effexor -did counseling  GERD: -uses prilosec 40mg  prn -hx ulcer, treated -has occ reflux if eats the wrong thing  Thyroid nodules:  - sees endo, Dr. 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 Juliene Pina on regular basis  -sexual activity: yes, female partner, no new partners  -wants STI testing (Hep C if born [birth year]): no  -FH breast, colon or ovarian ca: see FH Last mammogram: does breast exams with gyn Last colon cancer screening:n/a  -Alcohol, Tobacco, drug use: see social history  Review of Systems - no fevers, unintentional weight loss, vision loss, hearing loss, chest pain, sob, hemoptysis, melena, hematochezia, hematuria, genital discharge, changing or concerning skin lesions, bleeding, bruising, loc, thoughts of self harm or SI  Past Medical History:  Diagnosis Date  . Anxiety   . Chicken pox   . Depression   . GERD (gastroesophageal reflux disease)   . Thyroid nodule    benign  . Ulcer (HCC)   . UTI (urinary tract infection)     Past Surgical History:  Procedure Laterality Date  . BREAST ENHANCEMENT SURGERY  2007  . NASAL SINUS SURGERY  ?2010    Family History  Problem Relation Age of Onset  . Alcohol abuse Father   . Arthritis Mother   . Arthritis Father   . Uterine cancer Other   . Colon cancer  Other   . Hyperlipidemia Mother   . Heart disease Father   . Hypertension Mother   . Hypertension Father   . Sudden death Paternal Grandmother   . Cancer Other     Aunts  . Emphysema Father     heavy smoker    Social History   Social History  . Marital status: Single    Spouse name: N/A  . Number of children: 0  . Years of education: 66   Occupational History  . [employer]   Social History Main Topics  . Smoking status: Former Smoker    Years: 3.00    Types: Cigarettes  . Smokeless tobacco: Never Used     Comment: previous social smoker   . Alcohol use Yes  . Drug use: No  . Sexual activity: Not Asked   Other Topics Concern  . None   Social History Narrative   Regular exercise-yes   Caffeine Use-yes     Current Outpatient Prescriptions:  .  fluticasone (FLONASE) 50 MCG/ACT nasal spray, USE 1 SPRAY INTO EACH NOSTRIL TWICE DAILY, Disp: 16 g, Rfl: 6 .  levonorgestrel (MIRENA) 20 MCG/24HR IUD, 1 each by Intrauterine route once., Disp: , Rfl:  .  NON FORMULARY, Inflavonoid Intensive Care, Disp: , Rfl:  .  omeprazole (PRILOSEC) 20 MG capsule, TAKE 1 CAPSULE (20 MG TOTAL) BY MOUTH DAILY., Disp: 30 capsule, Rfl: 3 .  valACYclovir (VALTREX) 500 MG tablet, TAKE 1 TABLET BY  MOUTH EVERY DAY, Disp: 30 tablet, Rfl: 1 .  venlafaxine XR (EFFEXOR-XR) 150 MG 24 hr capsule, Take 1 capsule (150 mg total) by mouth daily with breakfast., Disp: 90 capsule, Rfl: 1  EXAM:  Vitals:   12/06/16 0823  BP: 112/82  Pulse: 82  Temp: 98 F (36.7 C)    GENERAL: vitals reviewed and listed below, alert, oriented, appears well hydrated and in no acute distress  HEENT: head atraumatic, PERRLA, normal appearance of eyes, ears, nose and mouth. moist mucus membrane., normal appearance of ear canals and TMs with MEE L > R, clear nasal congestion, mild post oropharyngeal erythema with PND, no tonsillar edema or exudate, no sinus TTP  NECK: supple, no masses or lymphadenopathy  LUNGS:  clear to auscultation bilaterally, no rales, rhonchi or wheeze  CV: HRRR, no peripheral edema or cyanosis, normal pedal pulses  ABDOMEN: bowel sounds normal, soft, non tender to palpation, no masses, no rebound or guarding  GU/BREAST: declined, does with gyn  SKIN: no rash or abnormal lesions, skin exam full declined - does with dermatology  MS: normal gait, moves all extremities normally  NEURO: normal gait, speech and thought processing grossly intact, muscle tone grossly intact throughout  PSYCH: normal affect, pleasant and cooperative  ASSESSMENT AND PLAN:  Discussed the following assessment and plan:  Encounter for preventive health examination - Plan: Lipid panel  Hyperglycemia - Plan: Hemoglobin A1c  Recurrent major depressive disorder, in full remission (HCC)  Seasonal allergic rhinitis, unspecified chronicity, unspecified trigger  Dysfunction of left eustachian tube  Somatic dysfunction of head region   -Discussed and advised all [place] preventive services health task force level A and B recommendations for age, sex and risks.  -Advised at least 150 minutes of exercise per week and a healthy diet with avoidance of (less then 1 serving per week) processed foods, white starches, red meat, fast foods and sweets and consisting of: * 5-9 servings of fresh fruits and vegetables (not corn or potatoes) *nuts and seeds, beans *olives and olive oil *lean meats such as fish and white chicken  *whole grains  -discussed options fro tx ETD - she opted for OMT today, has car tissue L ear and around, [place] and ear pull (gentle myofacial tx ear and jaw)- tolerated will and she reported "ear drained" opened up and she felt much better after treatment.  -labs, studies and vaccines per orders this encounter  Orders Placed This Encounter  Procedures  . Hemoglobin A1c  . Lipid panel    Patient advised to return to clinic immediately if symptoms worsen or persist or new  concerns.  Patient Instructions  .BEFORE YOU LEAVE: -follow up: 4-6 month or sooner as needed -labs  Vit D3 1000 IU daily - cosco brand is good.  Short course afrin - 3 days only. Try the ear treatments. Follow up if vertigo persists in 1 week.  We have ordered labs or studies at this visit. It can take up to 1-2 weeks for results and processing. IF results require follow up or explanation, we will call you with instructions. Clinically stable results will be released to your [facility]. If you have not heard from [place] or cannot find your results in [facility] in 2 weeks please contact our office at [phone].  If you are not yet signed up for [facility], please consider signing up.   We recommend the following healthy lifestyle for LIFE: 1) Small portions.   Tip: eat off of a salad plate instead of a  dinner plate.  Tip: if you need more or a snack choose fruits, veggies and/or a handful of nuts or seeds.  2) Eat a healthy clean diet.  * Tip: Avoid (less then 1 serving per week): processed foods, sweets, sweetened drinks, white starches (rice, flour, bread, potatoes, pasta, etc), red meat, fast foods, butter  *Tip: CHOOSE instead   * 5-9 servings per day of fresh or frozen fruits and vegetables (but not corn, potatoes, bananas, canned or dried fruit)   *nuts and seeds, beans   *olives and olive oil   *small portions of lean meats such as fish and white chicken    *small portions of whole grains  3)Get at least 150 minutes of sweaty aerobic exercise per week.  4)Reduce stress - consider counseling, meditation and relaxation to balance other aspects of your life.  WE NOW OFFER   [facility] FAST TRACK!!!  SAME DAY Appointments for ACUTE CARE  Such as: Sprains, Injuries, cuts, abrasions, rashes, muscle pain, joint pain, back pain Colds, flu, sore throats, headache, allergies, cough, fever  Ear pain, sinus and eye infections Abdominal pain, nausea, vomiting, diarrhea, upset  stomach Animal/insect bites  3 Easy Ways to Schedule: Walk-In Scheduling Call in scheduling Mychart Sign-up: [email or web address]                No Follow-up on file.  [name], Udell Mazzocco R., DO

## 2016-12-06 ENCOUNTER — Encounter: Payer: Self-pay | Admitting: Family Medicine

## 2016-12-06 ENCOUNTER — Ambulatory Visit (INDEPENDENT_AMBULATORY_CARE_PROVIDER_SITE_OTHER): Payer: BLUE CROSS/BLUE SHIELD | Admitting: Family Medicine

## 2016-12-06 VITALS — BP 112/82 | HR 82 | Temp 98.0°F | Ht 65.25 in | Wt 168.4 lb

## 2016-12-06 DIAGNOSIS — R739 Hyperglycemia, unspecified: Secondary | ICD-10-CM

## 2016-12-06 DIAGNOSIS — M9900 Segmental and somatic dysfunction of head region: Secondary | ICD-10-CM

## 2016-12-06 DIAGNOSIS — J302 Other seasonal allergic rhinitis: Secondary | ICD-10-CM

## 2016-12-06 DIAGNOSIS — F3342 Major depressive disorder, recurrent, in full remission: Secondary | ICD-10-CM | POA: Diagnosis not present

## 2016-12-06 DIAGNOSIS — H6982 Other specified disorders of Eustachian tube, left ear: Secondary | ICD-10-CM

## 2016-12-06 DIAGNOSIS — Z0000 Encounter for general adult medical examination without abnormal findings: Secondary | ICD-10-CM | POA: Diagnosis not present

## 2016-12-06 LAB — LIPID PANEL
Cholesterol: 185 mg/dL (ref 0–200)
HDL: 42.1 mg/dL (ref >39.00)
LDL Cholesterol: 107 mg/dL — ABNORMAL HIGH (ref 0–99)
NonHDL: 143.25
Total CHOL/HDL Ratio: 4
Triglycerides: 179 mg/dL — ABNORMAL HIGH (ref 0.0–149.0)
VLDL: 35.8 mg/dL (ref 0.0–40.0)

## 2016-12-06 LAB — HEMOGLOBIN A1C: Hgb A1c MFr Bld: 5.4 % (ref 4.6–6.5)

## 2016-12-06 NOTE — Progress Notes (Signed)
Pre visit review using our clinic review tool, if applicable. No additional management support is needed unless otherwise documented below in the visit note. 

## 2016-12-06 NOTE — Patient Instructions (Signed)
.  BEFORE YOU LEAVE: -follow up: 4-6 month or sooner as needed -labs  Vit D3 1000 IU daily - cosco brand is good.  Short course afrin - 3 days only. Try the ear treatments. Follow up if vertigo persists in 1 week.  We have ordered labs or studies at this visit. It can take up to 1-2 weeks for results and processing. IF results require follow up or explanation, we will call you with instructions. Clinically stable results will be [facility]. If you have not heard from us or cannot find your results in [facility] in 2 weeks please contact our office at [phone].  If you are not yet signed up for [facility], please consider signing up.   We recommend the following healthy lifestyle for LIFE: 1) Small portions.   Tip: eat off of a salad plate instead of a dinner plate.  Tip: if you need more or a snack choose fruits, veggies and/or a handful of nuts or seeds.  2) Eat a healthy clean diet.  * Tip: Avoid (less then 1 serving per week): processed foods, sweets, sweetened drinks, white starches (rice, flour, bread, potatoes, pasta, etc), red meat, fast foods, butter  *Tip: CHOOSE instead   * 5-9 servings per day of fresh or frozen fruits and vegetables (but not corn, potatoes, bananas, canned or dried fruit)   *nuts and seeds, beans   *olives and olive oil   *small portions of lean meats such as fish and white chicken    *small portions of whole grains  3)Get at least 150 minutes of sweaty aerobic exercise per week.  4)Reduce stress - consider counseling, meditation and relaxation to balance other aspects of your life.  WE NOW OFFER   [facility] FAST TRACK!!!  SAME DAY Appointments for ACUTE CARE  Such as: Sprains, Injuries, cuts, abrasions, rashes, muscle pain, joint pain, back pain Colds, flu, sore throats, headache, allergies, cough, fever  Ear pain, sinus and eye infections Abdominal pain, nausea, vomiting, diarrhea, upset stomach Animal/insect bites  3 Easy  Ways to Schedule: Walk-In Scheduling Call in scheduling Mychart Sign-up: [email or web address]

## 2016-12-20 ENCOUNTER — Other Ambulatory Visit: Payer: Self-pay | Admitting: Family Medicine

## 2017-02-12 DIAGNOSIS — Z85820 Personal history of malignant melanoma of skin: Secondary | ICD-10-CM | POA: Diagnosis not present

## 2017-02-12 DIAGNOSIS — Z85828 Personal history of other malignant neoplasm of skin: Secondary | ICD-10-CM | POA: Diagnosis not present

## 2017-02-12 DIAGNOSIS — D225 Melanocytic nevi of trunk: Secondary | ICD-10-CM | POA: Diagnosis not present

## 2017-02-12 DIAGNOSIS — D2262 Melanocytic nevi of left upper limb, including shoulder: Secondary | ICD-10-CM | POA: Diagnosis not present

## 2017-02-19 ENCOUNTER — Other Ambulatory Visit: Payer: Self-pay | Admitting: Family Medicine

## 2017-03-18 ENCOUNTER — Other Ambulatory Visit: Payer: Self-pay | Admitting: Family Medicine

## 2017-04-08 ENCOUNTER — Ambulatory Visit (INDEPENDENT_AMBULATORY_CARE_PROVIDER_SITE_OTHER): Payer: BLUE CROSS/BLUE SHIELD | Admitting: Internal Medicine

## 2017-04-08 ENCOUNTER — Encounter: Payer: Self-pay | Admitting: Internal Medicine

## 2017-04-08 VITALS — BP 118/80 | HR 81 | Wt 168.0 lb

## 2017-04-08 DIAGNOSIS — E042 Nontoxic multinodular goiter: Secondary | ICD-10-CM | POA: Diagnosis not present

## 2017-04-08 DIAGNOSIS — Z8639 Personal history of other endocrine, nutritional and metabolic disease: Secondary | ICD-10-CM

## 2017-04-08 DIAGNOSIS — R5383 Other fatigue: Secondary | ICD-10-CM | POA: Diagnosis not present

## 2017-04-08 LAB — TSH: TSH: 0.82 u[IU]/mL (ref 0.35–4.50)

## 2017-04-08 LAB — T4, FREE: Free T4: 0.77 ng/dL (ref 0.60–1.60)

## 2017-04-08 LAB — T3, FREE: T3, Free: 3.4 pg/mL (ref 2.3–4.2)

## 2017-04-08 LAB — VITAMIN D 25 HYDROXY (VIT D DEFICIENCY, FRACTURES): VITD: 24.7 ng/mL — ABNORMAL LOW (ref 30.00–100.00)

## 2017-04-08 LAB — VITAMIN B12: Vitamin B-12: 296 pg/mL (ref 211–911)

## 2017-04-08 NOTE — Patient Instructions (Signed)
Please call me in 3 months to schedule the thyroid U/S.  Please stop at the lab.  Please return in 1 year.

## 2017-04-08 NOTE — Progress Notes (Signed)
Subjective:     Patient ID: Ashley Allen, female   DOB: 1971-01-29, 46 y.o.   MRN: [id number]  HPI Ashley Allen is a 46 y.o.y/o woman returning for follow-up for h/o hypothyroidism and thyroid nodules. Last visit 1 year ago.   Hypothyroidism: Reviewed history: She was started on Synthroid by her previous endocrinologist in 2008, for goiter, but she does not remember having blood work or thyroid U/S done, so it is unclear whether this was started for hypothyroidism or in an attempt to decrease the size of a thyroid nodule that was palpated at that time.   She took the medication for about 6 months then, then stopped it 8 years ago per [employer]. She had normal TSH (0.39 on 07/03/2012 - LLN 0.35) despite being off Synthroid. She did not feel different after starting or after stopping the medication.   At her visit with her PCP, in 08/18/2012, she had multiple vague complaints: fatigue, wt gain, constipation, feeling cold. She was wondering whether she needed to restart Synthroid at that time so she was referred to endocrinology.  After her moved to [facility], she started to see a naturopath, who started Cytomel 10 mg bid in 07/2014 >> she felt well, but did not know whether this is mandatory medication so she tapered it off, now completely off for last 2 weeks.   I reviewed thyroid labs from her naturopath: 11/03/2014, while on Cytomel: TSH 0.02 (0.4-4.5), free T4 0.9 (0.8-1.8), free T3 4.2 (2.3-4.2), reverse T3 11 (8-25), serum iodine 44 (50-109). A note was made that the thyroid status was "perfect".  Normal TFTs at last checks >> we did not start LT4. Lab Results  Component Value Date   TSH 0.62 12/07/2015   TSH 0.87 04/22/2015   TSH 0.85 03/03/2015   TSH 0.921 08/28/2012   TSH 0.39 07/03/2012   FREET4 0.90 04/22/2015   FREET4 0.81 03/03/2015   FREET4 1.25 08/28/2012   Component     Latest Ref Rng & Units 08/28/2012  TSI     <140 % baseline 75  Thyroperoxidase Ab SerPl-aCnc     <35.0  IU/mL 26.3   Thyroid nodules:  Patient had a thyroid U/S (05/2014): 2 thyroid nodules:  Isoechoic nodule at right superior pole and isoechoicnodule at right inferior. Normal vascularity, no calcification.   Right upper: 1.6 x 1.0 x 1.4 cm, right lower: 2.3 x 1.8 x 1.9 cm    FNA (06/21/2014): 1. Thyroid nodule, right upper, FNA (smears):  - Bethesda category: 2Benign.  - Benign follicular nodule, consistent with colloid nodule.    2. Thyroid nodule, right lower, FNA (smears):  - Bethesda category: 2Benign.  - Benign follicular nodule, consistent with colloid nodule.    Since last visit, she had a thyroid U/S (07/10/2016): Isthmus: 0.5 cm. No discrete nodules are identified within the thyroid isthmus. Right lobe: 6.5 x 2.1 x 2.3 cm Nodule # 1: Location: Right; Mid Size: More discrete nodular area measures 1.4 x 1.0 x 1.1 cm. Additional heterogeneous tissue present when included conglomerate nodular region measures as much as 3.0 x 1.1 x 2.1 cm. Composition: solid/almost completely solid (2) Echogenicity: isoechoic (1) *Given size (>/= 1.5 - 2.4 cm) and appearance, a follow-up ultrasound in 1 year should be considered based on TI-RADS criteria. Left lobe: 5.5 x 1.2 x 2.0 cm No discrete nodules are identified within the left lobe of the thyroid.  IMPRESSION: Nodular solid region within the right lobe of the thyroid gland. This nodular abnormality is  very difficult to measure due to adjacent heterogeneous tissue. Discrete nodule may measure is little as 1.4 cm or as much as 3 cm. Correlation suggested with prior imaging and biopsy results. Given size discrepancy, the best approach would likely be a follow-up ultrasound in 1 year.  Pt denies: - feeling nodules in neck - hoarseness - dysphagia - choking - SOB with lying down  Mother has Graves, cousin with same. No FH of thyroid cancer. No h/o radiation tx to head or neck.  No seaweed or kelp. No recent  contrast studies. No herbal supplements. No Biotin use. No recent steroids use.   Past Medical History:  Diagnosis Date  . Anxiety   . Chicken pox   . Depression   . GERD (gastroesophageal reflux disease)   . Thyroid nodule    benign  . Ulcer   . UTI (urinary tract infection)    Past Surgical History:  Procedure Laterality Date  . BREAST ENHANCEMENT SURGERY  2007  . NASAL SINUS SURGERY  ?2010   History   Social History  . Marital Status: Single    Spouse Name: N/A    Number of Children: 0  . 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 Smoking status: Former Games developermoker  . Smokeless tobacco: Not on file     Comment: previous social smoker   . Alcohol Use: Yes  . Drug Use: No   Current Outpatient Prescriptions on File Prior to Visit  Medication Sig Dispense Refill  . fluticasone (FLONASE) 50 MCG/ACT nasal spray USE 1 SPRAY INTO EACH NOSTRIL TWICE DAILY 16 g 3  . levonorgestrel (MIRENA) 20 MCG/24HR IUD 1 each by Intrauterine route once.    . NON FORMULARY Inflavonoid Intensive Care    . omeprazole (PRILOSEC) 20 MG capsule TAKE 1 CAPSULE (20 MG TOTAL) BY MOUTH DAILY. (Patient taking differently: Take 20 mg by mouth daily. ) 30 capsule 5  . valACYclovir (VALTREX) 500 MG tablet TAKE 1 TABLET BY MOUTH EVERY DAY 30 tablet 1  . venlafaxine XR (EFFEXOR-XR) 75 MG 24 hr capsule TAKE 1 CAPSULE (75 MG TOTAL) BY MOUTH DAILY WITH BREAKFAST. 90 capsule 1   No current facility-administered medications on file prior to visit.    Allergies  Allergen Reactions  . Polysporin [Bacitracin-Polymyxin B]   . Sulfonamide Derivatives     REACTION: Itching   Family History  Problem Relation Age of Onset  . Alcohol abuse Father   . Arthritis Mother   . Arthritis Father   . Uterine cancer Other   . Colon cancer Other   . Hyperlipidemia Mother   . Heart disease Father   . Hypertension Mother   . Hypertension Father   . Sudden  death Paternal Grandmother   . Cancer Other        Aunts  . Emphysema Father        heavy smoker   Review of Systems  Constitutional: + weight gain, + fatigue, no subjective hyperthermia, + subjective hypothermia (chronic0 Eyes: + blurry vision, no xerophthalmia ENT: no sore throat, no nodules palpated in throat, no dysphagia, no odynophagia, no hoarseness Cardiovascular: no CP/no SOB/no palpitations/no leg swelling Respiratory: no cough/no SOB/no wheezing Gastrointestinal: no N/no V/+ D/+ C/no acid reflux Musculoskeletal: + muscle aches/+ joint aches Skin: no rashes, no hair loss Neurological: no tremors/no numbness/no tingling/no dizziness  I reviewed pt's medications, allergies, PMH, social hx, family hx, and changes  were documented in the history of present illness. Otherwise, unchanged from my initial visit note. Changed Effexor dose (lower)   Objective:   Physical Exam BP 118/80 (BP Location: Left Arm, Patient Position: Sitting)   Pulse 81   Wt 168 lb (76.2 kg)   SpO2 98%   BMI 27.74 kg/m  Body mass index is 27.74 kg/m. Wt Readings from Last 3 Encounters:  04/08/17 168 lb (76.2 kg)  12/06/16 168 lb 6.4 oz (76.4 kg)  07/02/16 165 lb 1.6 oz (74.9 kg)   Constitutional: overweight, in NAD Eyes: PERRLA, EOMI, no exophthalmos ENT: moist mucous membranes, R>L slight thyromegaly, no cervical lymphadenopathy Cardiovascular: RRR, No MRG Respiratory: CTA B Gastrointestinal: abdomen soft, NT, ND, BS+ Musculoskeletal: no deformities, strength intact in all 4 Skin: moist, warm, no rashes Neurological: no tremor with outstretched hands, DTR normal in all 4  Assessment:     1. H/o Hypothyroidism - now euthyroid  2. 2 thyroid nodules    Plan:     1. H/o Hypothyroidism  - Reviewed labs obtained since 2013, which were all normal off to thyroid hormones - We'll continue to keep an eye on her TFTs, with annual checks. We'll check levels today.  2. Thyroid nodules - Patient  has a history of 2 right thyroid nodules on ultrasound obtained on 05/2014. The images were not available. we obtained another ultrasound in 06/2016 and this showed a conglomerate of nodules in the right thyroid lobe. The suggestion was to repeat an ultrasound in one year after the previous. I discussed about this with the patient and she agrees. We'll repeat the ultrasound in 06/2017. - She has no neck compression symptoms  - RTC in  1 year.   Component     Latest Ref Rng & Units 04/08/2017  TSH     0.35 - 4.50 uIU/mL 0.82  Triiodothyronine,Free,Serum     2.3 - 4.2 pg/mL 3.4  T4,Free(Direct)     0.60 - 1.60 ng/dL 1.61  Vitamin W96     045 - 911 pg/mL 296  VITD     30.00 - 100.00 ng/mL 24.70 (L)   TFTs are normal, however, she has a low vitamin D, For which I would suggest to start 2000 units vitamin D daily, and also a low normal B12, for which I would suggest to start 1000 mcg B12 daily. She will need a repeat of these tests in 2 mo >> she has an point with Dr. Selena Batten then, would advise her to have them drawn at that time.    Carlus Pavlov, MD PhD [facility] Endocrinology .

## 2017-04-09 ENCOUNTER — Encounter: Payer: Self-pay | Admitting: Internal Medicine

## 2017-04-10 ENCOUNTER — Encounter: Payer: Self-pay | Admitting: Family Medicine

## 2017-04-10 DIAGNOSIS — R7989 Other specified abnormal findings of blood chemistry: Secondary | ICD-10-CM | POA: Insufficient documentation

## 2017-04-10 DIAGNOSIS — E538 Deficiency of other specified B group vitamins: Secondary | ICD-10-CM | POA: Insufficient documentation

## 2017-04-22 NOTE — Progress Notes (Deleted)
Ashley Allen D.O. [facility] [address] [place], [place] [address] Phone: [phone] Subjective:    I'm seeing this patient by the request  of:    CC: Hip pain  OZH:[id number]:Subjective  Ashley Richardson DoppCole is a 46 y.o. female coming in with complaint of hip pain.  Onset-  Location Duration-  Character- Aggravating factors- Reliving factors-  Therapies tried-  Severity-     Past Medical History:  Diagnosis Date  . Anxiety   . Chicken pox   . Depression   . GERD (gastroesophageal reflux disease)   . Thyroid nodule    benign  . Ulcer   . UTI (urinary tract infection)    Past Surgical History:  Procedure Laterality Date  . BREAST ENHANCEMENT SURGERY  2007  . NASAL SINUS SURGERY  ?2010   Social History   Social History  . Marital status: Single    Spouse name: N/A  . Number of children: 0  . 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 Smoking status: Former Smoker    Years: 3.00    Types: Cigarettes  . Smokeless tobacco: Never Used     Comment: previous social smoker   . Alcohol use Yes  . Drug use: No  . Sexual activity: Not on file   Other Topics Concern  . Not on file   Social History Narrative   Regular exercise-yes   Caffeine Use-yes   Allergies  Allergen Reactions  . Polysporin [Bacitracin-Polymyxin B]   . Sulfonamide Derivatives     REACTION: Itching   Family History  Problem Relation Age of Onset  . Alcohol abuse Father   . Arthritis Mother   . Arthritis Father   . Uterine cancer Other   . Colon cancer Other   . Hyperlipidemia Mother   . Heart disease Father   . Hypertension Mother   . Hypertension Father   . Sudden death Paternal Grandmother   . Cancer Other        Aunts  . Emphysema Father        heavy smoker     Past medical history, social, surgical and family history all reviewed in electronic medical record.  No pertanent information unless stated regarding to  the chief complaint.   Review of Systems:Review of systems updated and as accurate as of 04/22/17  No headache, visual changes, nausea, vomiting, diarrhea, constipation, dizziness, abdominal pain, skin rash, fevers, chills, night sweats, weight loss, swollen lymph nodes, body aches, joint swelling, muscle aches, chest pain, shortness of breath, mood changes.   Objective  There were no vitals taken for this visit. Systems examined below as of 04/22/17   General: No apparent distress alert and oriented x3 mood and affect normal, dressed appropriately.  HEENT: Pupils equal, extraocular movements intact  Respiratory: Patient's speak in full sentences and does not appear short of breath  Cardiovascular: No lower extremity edema, non tender, no erythema  Skin: Warm dry intact with no signs of infection or rash on extremities or on axial skeleton.  Abdomen: Soft nontender  Neuro: Cranial nerves II through XII are intact, neurovascularly intact in all extremities with 2+ DTRs and 2+ pulses.  Lymph: No lymphadenopathy of posterior or anterior cervical chain or axillae bilaterally.  Gait normal with good balance and coordination.  MSK:  Non tender with full range of motion and good stability and symmetric strength and tone of shoulders, elbows, wrist,  hip, knee and ankles bilaterally.     Impression and Recommendations:     This case required medical decision making of moderate complexity.      Note: This dictation was prepared with Dragon dictation along with smaller phrase technology. Any transcriptional errors that result from this process are unintentional.

## 2017-04-24 ENCOUNTER — Ambulatory Visit: Payer: BLUE CROSS/BLUE SHIELD | Admitting: Family Medicine

## 2017-05-01 ENCOUNTER — Encounter: Payer: Self-pay | Admitting: Family Medicine

## 2017-05-01 ENCOUNTER — Other Ambulatory Visit: Payer: Self-pay | Admitting: Family Medicine

## 2017-05-01 ENCOUNTER — Ambulatory Visit (INDEPENDENT_AMBULATORY_CARE_PROVIDER_SITE_OTHER)
Admission: RE | Admit: 2017-05-01 | Discharge: 2017-05-01 | Disposition: A | Discharge status: Home / Self Care | Payer: BLUE CROSS/BLUE SHIELD | Source: Ambulatory Visit | Attending: Family Medicine | Admitting: Family Medicine

## 2017-05-01 ENCOUNTER — Ambulatory Visit (INDEPENDENT_AMBULATORY_CARE_PROVIDER_SITE_OTHER): Payer: BLUE CROSS/BLUE SHIELD | Admitting: Family Medicine

## 2017-05-01 VITALS — BP 120/90 | HR 92 | Ht 64.5 in | Wt 169.0 lb

## 2017-05-01 DIAGNOSIS — M545 Low back pain, unspecified: Secondary | ICD-10-CM

## 2017-05-01 DIAGNOSIS — S3992XA Unspecified injury of lower back, initial encounter: Secondary | ICD-10-CM | POA: Diagnosis not present

## 2017-05-01 DIAGNOSIS — Z1151 Encounter for screening for human papillomavirus (HPV): Secondary | ICD-10-CM | POA: Diagnosis not present

## 2017-05-01 DIAGNOSIS — Z1231 Encounter for screening mammogram for malignant neoplasm of breast: Secondary | ICD-10-CM | POA: Diagnosis not present

## 2017-05-01 DIAGNOSIS — R87610 Atypical squamous cells of undetermined significance on cytologic smear of cervix (ASC-US): Secondary | ICD-10-CM | POA: Diagnosis not present

## 2017-05-01 DIAGNOSIS — Z01419 Encounter for gynecological examination (general) (routine) without abnormal findings: Secondary | ICD-10-CM | POA: Diagnosis not present

## 2017-05-01 DIAGNOSIS — M999 Biomechanical lesion, unspecified: Secondary | ICD-10-CM | POA: Insufficient documentation

## 2017-05-01 DIAGNOSIS — M549 Dorsalgia, unspecified: Secondary | ICD-10-CM | POA: Diagnosis not present

## 2017-05-01 DIAGNOSIS — S79911A Unspecified injury of right hip, initial encounter: Secondary | ICD-10-CM | POA: Diagnosis not present

## 2017-05-01 DIAGNOSIS — M533 Sacrococcygeal disorders, not elsewhere classified: Secondary | ICD-10-CM

## 2017-05-01 DIAGNOSIS — M25551 Pain in right hip: Secondary | ICD-10-CM | POA: Diagnosis not present

## 2017-05-01 DIAGNOSIS — Z6828 Body mass index (BMI) 28.0-28.9, adult: Secondary | ICD-10-CM | POA: Diagnosis not present

## 2017-05-01 MED ORDER — GABAPENTIN 100 MG PO CAPS
200.0000 mg | ORAL_CAPSULE | Freq: Every day | ORAL | 3 refills | Status: DC
Start: 2017-05-01 — End: 2017-12-31

## 2017-05-01 NOTE — Patient Instructions (Signed)
Good to see you  Xray downstairs Ice 20 minutes 2 times daily. Usually after activity and before bed. Exercises 3 times a week.  Gabapentin 200mg  at night Vitamin D 2000 IU daily  Turmeric 500mg  twice daily  Tart cherry extract any dose at night See me again in 4 weeks

## 2017-05-01 NOTE — Progress Notes (Signed)
[name] [address] [place], [place] [address] Phone: [phone] Subjective:    I'm seeing this patient by the request  of:  Terressa Koyanagi, DO   CC: Right hip pain  [id number]  Ashley Allen is a 46 y.o. female coming in with complaint of lower back pain that has been going on for 8 months. Exercise and sitting in the car make it worse. She is seeing a chiropractor for sciatic nerve irritation. The pain has moved to the front of her leg. The pain can be sharp at time. Patient has taken vitamin D and B12.   Onset- 6-8 months Location- right hip  Duration-  intermittent Character-sharp at times Aggravating factors- physical activity, sitting in the car Reliving factors- stretching Therapies tried- vitamin d and b12, IBU Severity- 8/10 at it's worst     Past Medical History:  Diagnosis Date  . Anxiety   . Chicken pox   . Depression   . GERD (gastroesophageal reflux disease)   . Thyroid nodule    benign  . Ulcer   . UTI (urinary tract infection)    Past Surgical History:  Procedure Laterality Date  . BREAST ENHANCEMENT SURGERY  2007  . NASAL SINUS SURGERY  ?2010   Social History   Social History  . Marital status: Single    Spouse name: N/A  . Number of children: 0  . 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 Smoking status: Former Smoker    Years: 3.00    Types: Cigarettes  . Smokeless tobacco: Never Used     Comment: previous social smoker   . Alcohol use Yes  . Drug use: No  . Sexual activity: Not on file   Other Topics Concern  . Not on file   Social History Narrative   Regular exercise-yes   Caffeine Use-yes   Allergies  Allergen Reactions  . Polysporin [Bacitracin-Polymyxin B]   . Sulfonamide Derivatives     REACTION: Itching   Family History  Problem Relation Age of Onset  . Alcohol abuse Father   . Arthritis Mother   . Arthritis Father   .  Uterine cancer Other   . Colon cancer Other   . Hyperlipidemia Mother   . Heart disease Father   . Hypertension Mother   . Hypertension Father   . Sudden death Paternal Grandmother   . Cancer Other        Aunts  . Emphysema Father        heavy smoker     Past medical history, social, surgical and family history all reviewed in electronic medical record.  No pertanent information unless stated regarding to the chief complaint.   Review of Systems:Review of systems updated and as accurate as of 05/01/17  No headache, visual changes, nausea, vomiting, diarrhea, constipation, dizziness, abdominal pain, skin rash, fevers, chills, night sweats, weight loss, swollen lymph nodes, body aches, joint swelling, muscle aches, chest pain, shortness of breath, mood changes.   Objective  There were no vitals taken for this visit. Systems examined below as of 05/01/17   General: No apparent distress alert and oriented x3 mood and affect normal, dressed appropriately.  HEENT: Pupils equal, extraocular movements intact  Respiratory: Patient's speak in full sentences and does not appear short of breath  Cardiovascular: No lower extremity edema, non tender, no erythema  Skin: Warm dry intact with no  signs of infection or rash on extremities or on axial skeleton.  Abdomen: Soft nontender  Neuro: Cranial nerves II through XII are intact, neurovascularly intact in all extremities with 2+ DTRs and 2+ pulses.  Lymph: No lymphadenopathy of posterior or anterior cervical chain or axillae bilaterally.  Gait normal with good balance and coordination.  MSK:  Non tender with full range of motion and good stability and symmetric strength and tone of shoulders, elbows, wrist,  knee and ankles bilaterally.  Back Exam:  Inspection: Loss of lordosis Motion: Flexion 45 deg, Extension 25 deg, Side Bending to 45 deg bilaterally,  Rotation to 45 deg bilaterally  SLR laying: Negative  XSLR laying: Negative  Palpable  tenderness: Tender over the right sacroiliac joint. FABER: Positive right. Sensory change: Gross sensation intact to all lumbar and sacral dermatomes.  Reflexes: 2+ at both patellar tendons, 2+ at achilles tendons, Babinski's downgoing.  Strength at foot  Plantar-flexion: 5/5 Dorsi-flexion: 5/5 Eversion: 5/5 Inversion: 5/5  Leg strength  Quad: 5/5 Hamstring: 5/5 Hip flexor: 5/5 Hip abductors: 5/5  Gait unremarkable.  Osteopathic findings Cervical C2 flexed rotated and side bent right C4 flexed rotated and side bent left C6 flexed rotated and side bent left T3 extended rotated and side bent right inhaled third rib T9 extended rotated and side bent left L2 flexed rotated and side bent right Sacrum right on right  Procedure note 97110; 15 additional minutes spent for Therapeutic exercises as stated in above notes.  This included exercises focusing on stretching, strengthening, with significant focus on eccentric aspects.   Long term goals include an improvement in range of motion, strength, endurance as well as avoiding reinjury. Patient's frequency would include in 1-2 times a day, 3-5 times a week for a duration of 6-12 weeks. Low back exercises that included:  Pelvic tilt/bracing instruction to focus on control of the pelvic girdle and lower abdominal muscles  Glute strengthening exercises, focusing on proper firing of the glutes without engaging the low back muscles Proper stretching techniques for maximum relief for the hamstrings, hip flexors, low back and some rotation where tolerated   Proper technique shown and discussed handout in great detail with ATC.  All questions were discussed and answered.     Impression and Recommendations:     This case required medical decision making of moderate complexity.      Note: This dictation was prepared with Dragon dictation along with smaller phrase technology. Any transcriptional errors that result from this process are  unintentional.

## 2017-05-01 NOTE — Assessment & Plan Note (Signed)
Sacroiliac Joint Mobilization and Rehab 1. Work on pretzel stretching, shoulder back and leg draped in front. 3-5 sets, 30 sec.. 2. hip abductor rotations. standing, hip flexion and rotation outward then inward. 3 sets, 15 reps. when can do comfortably, add ankle weights starting at 2 pounds.  3. cross over stretching - shoulder back to ground, same side leg crossover. 3-5 sets for 30 min..  4. rolling up and back knees to chest and rocking. 5. sacral tilt - 5 sets, hold for 5-10 seconds Patient also given icing regimen. Possible lumbar radiculopathy and patient was started on a low dose gabapentin. Encourage her to continue with the Effexor. X-rays of the back in the hip ordered today for further evaluation for any bony abnormalities that can be contributing. Follow-up again in 4 weeks.

## 2017-05-01 NOTE — Assessment & Plan Note (Signed)
Decision today to treat with OMT was based on Physical Exam  After verbal consent patient was treated with HVLA, ME, FPR techniques in cervical, thoracic, lumbar and sacral areas  Patient tolerated the procedure well with improvement in symptoms  Patient given exercises, stretches and lifestyle modifications  See medications in patient instructions if given  Patient will follow up in 4 weeks 

## 2017-06-10 ENCOUNTER — Ambulatory Visit: Payer: BLUE CROSS/BLUE SHIELD | Admitting: Family Medicine

## 2017-06-13 ENCOUNTER — Encounter: Payer: Self-pay | Admitting: Family Medicine

## 2017-06-18 ENCOUNTER — Ambulatory Visit: Payer: BLUE CROSS/BLUE SHIELD | Admitting: Family Medicine

## 2017-06-20 ENCOUNTER — Encounter: Payer: Self-pay | Admitting: Family Medicine

## 2017-06-20 ENCOUNTER — Ambulatory Visit: Payer: BLUE CROSS/BLUE SHIELD | Admitting: Family Medicine

## 2017-06-20 ENCOUNTER — Ambulatory Visit (INDEPENDENT_AMBULATORY_CARE_PROVIDER_SITE_OTHER): Payer: BLUE CROSS/BLUE SHIELD | Admitting: Family Medicine

## 2017-06-20 VITALS — BP 118/80 | HR 90 | Temp 97.8°F | Ht 64.5 in | Wt 169.6 lb

## 2017-06-20 DIAGNOSIS — E559 Vitamin D deficiency, unspecified: Secondary | ICD-10-CM | POA: Diagnosis not present

## 2017-06-20 DIAGNOSIS — K219 Gastro-esophageal reflux disease without esophagitis: Secondary | ICD-10-CM | POA: Diagnosis not present

## 2017-06-20 DIAGNOSIS — E538 Deficiency of other specified B group vitamins: Secondary | ICD-10-CM

## 2017-06-20 DIAGNOSIS — Z85820 Personal history of malignant melanoma of skin: Secondary | ICD-10-CM

## 2017-06-20 DIAGNOSIS — Z8639 Personal history of other endocrine, nutritional and metabolic disease: Secondary | ICD-10-CM | POA: Diagnosis not present

## 2017-06-20 DIAGNOSIS — Z23 Encounter for immunization: Secondary | ICD-10-CM | POA: Diagnosis not present

## 2017-06-20 HISTORY — DX: Personal history of malignant melanoma of skin: Z85.820

## 2017-06-20 NOTE — Progress Notes (Signed)
HPI:  Ashley Allen is a pleasant 46 y.o. here for follow up. Chronic medical problems summarized below were reviewed for changes. Dx vit D and B12 deficiency with her endocrinologist. Testing was done because a family member had these deficiencies. She is taking oral b12 and Vit D3 2000IU daily. Wants to recheck. Denies CP, SOB, DOE, treatment intolerance or new symptoms.  Depression and anxiety: -started effexor 12/07/2015 -doing well now, wishes to continue effexor -did counseling  GERD: -uses prilosec  prn -hx ulcer, treated -has occ reflux if eats the wrong thing  Thyroid nodules/Hx hypothyroidism:  - sees endo, Dr. Elvera Lennox  FH colon cancer: -pat aunt 31 yo -maternal aunt 60s yo -wonders if she should have early colon ca screening - gyn advised this -reports [facility] not in network, gyn sent referral - she wants my thoughts on who she should see  ROS: See pertinent positives and negatives per HPI.  Past Medical History:  Diagnosis Date  . Anxiety   . Chicken pox   . Depression   . GERD (gastroesophageal reflux disease)   . Thyroid nodule    benign  . Ulcer   . UTI (urinary tract infection)     Past Surgical History:  Procedure Laterality Date  . BREAST ENHANCEMENT SURGERY  2007  . NASAL SINUS SURGERY  ?2010    Family History  Problem Relation Age of Onset  . Alcohol abuse Father   . Arthritis Mother   . Arthritis Father   . Uterine cancer Other   . Colon cancer Other   . Hyperlipidemia Mother   . Heart disease Father   . Hypertension Mother   . Hypertension Father   . Sudden death Paternal Grandmother   . Cancer Other        Aunts  . Emphysema Father        heavy smoker    Social History   Social History  . Marital status: Single    Spouse name: N/A  . Number of children: 0  . Years of education: 72   Occupational History  . [employer]   Social History Main Topics  . Smoking status: Former Smoker    Years: 3.00     Types: Cigarettes  . Smokeless tobacco: Never Used     Comment: previous social smoker   . Alcohol use Yes  . Drug use: No  . Sexual activity: Not Asked   Other Topics Concern  . None   Social History Narrative   Regular exercise-yes   Caffeine Use-yes     Current Outpatient Prescriptions:  .  fluticasone (FLONASE) 50 MCG/ACT nasal spray, USE 1 SPRAY INTO EACH NOSTRIL TWICE DAILY, Disp: 16 g, Rfl: 3 .  gabapentin (NEURONTIN) 100 MG capsule, Take 2 capsules (200 mg total) by mouth at bedtime., Disp: 60 capsule, Rfl: 3 .  levonorgestrel (MIRENA) 20 MCG/24HR IUD, 1 each by Intrauterine route once., Disp: , Rfl:  .  NON FORMULARY, Inflavonoid Intensive Care, Disp: , Rfl:  .  omeprazole (PRILOSEC) 20 MG capsule, TAKE 1 CAPSULE (20 MG TOTAL) BY MOUTH DAILY. (Patient taking differently: Take 20 mg by mouth daily. ), Disp: 30 capsule, Rfl: 5 .  valACYclovir (VALTREX) 500 MG tablet, TAKE 1 TABLET BY MOUTH EVERY DAY, Disp: 30 tablet, Rfl: 1 .  venlafaxine XR (EFFEXOR-XR) 75 MG 24 hr capsule, TAKE 1 CAPSULE (75 MG TOTAL) BY MOUTH DAILY WITH BREAKFAST., Disp: 90 capsule, Rfl: 1  EXAM:  Vitals:   06/20/17  1353  BP: 118/80  Pulse: 90  Temp: 97.8 F (36.6 C)    Body mass index is 28.66 kg/m.  GENERAL: vitals reviewed and listed above, alert, oriented, appears well hydrated and in no acute distress  HEENT: atraumatic, conjunttiva clear, no obvious abnormalities on inspection of external nose and ears  NECK: no obvious masses on inspection  LUNGS: clear to auscultation bilaterally, no wheezes, rales or rhonchi, good air movement  CV: HRRR, no peripheral edema  MS: moves all extremities without noticeable abnormality  PSYCH: pleasant and cooperative, no obvious depression or anxiety  ASSESSMENT AND PLAN:  Discussed the following assessment and plan:  Vitamin D deficiency - Plan: VITAMIN D 25 Hydroxy (Vit-D Deficiency, Fractures)  Vitamin B 12 deficiency - Plan: Vitamin  B12  Gastroesophageal reflux disease, esophagitis presence not specified  H/O: hypothyroidism  Hx of melanoma of skin  -labs per orders/pt request -lifestyle recs -continue effexor -flu shot today - risks/benefits -discussed various screening options for colon ca screening, she is concerned insurance might not pay for this, she is thinking she will see GI for consult and discuss with them - she reports already has referral from gyn, considering options -Patient advised to return or notify a doctor immediately if symptoms worsen or persist or new concerns arise.  Patient Instructions  BEFORE YOU LEAVE: -flu shot -labs -follow up: 6 months  We have ordered labs or studies at this visit. It can take up to 1-2 weeks for results and processing. IF results require follow up or explanation, we will call you with instructions. Clinically stable results will be released to your [facility]. If you have not heard from [place] or cannot find your results in [facility] in 2 weeks please contact our office at [phone].  If you are not yet signed up for [facility], please consider signing up.  WE NOW OFFER   [facility] FAST TRACK!!!  SAME DAY Appointments for ACUTE CARE  Such as: Sprains, Injuries, cuts, abrasions, rashes, muscle pain, joint pain, back pain Colds, flu, sore throats, headache, allergies, cough, fever  Ear pain, sinus and eye infections Abdominal pain, nausea, vomiting, diarrhea, upset stomach Animal/insect bites  3 Easy Ways to Schedule: Walk-In Scheduling Call in scheduling Mychart Sign-up: [email or web address]                Kriste Basque R., DO

## 2017-06-20 NOTE — Addendum Note (Signed)
Addended by: Johnella Moloney on: 06/20/2017 02:20 PM   Modules accepted: Orders

## 2017-06-20 NOTE — Patient Instructions (Signed)
BEFORE YOU LEAVE: -flu shot -labs -follow up: 6 months  We have ordered labs or studies at this visit. It can take up to 1-2 weeks for results and processing. IF results require follow up or explanation, we will call you with instructions. Clinically stable results will be released to your [facility]. If you have not heard from [place] or cannot find your results in [facility] in 2 weeks please contact our office at [phone].  If you are not yet signed up for [facility], please consider signing up.  WE NOW OFFER   [facility] FAST TRACK!!!  SAME DAY Appointments for ACUTE CARE  Such as: Sprains, Injuries, cuts, abrasions, rashes, muscle pain, joint pain, back pain Colds, flu, sore throats, headache, allergies, cough, fever  Ear pain, sinus and eye infections Abdominal pain, nausea, vomiting, diarrhea, upset stomach Animal/insect bites  3 Easy Ways to Schedule: Walk-In Scheduling Call in scheduling Mychart Sign-up: [email or web address]

## 2017-06-21 LAB — VITAMIN D 25 HYDROXY (VIT D DEFICIENCY, FRACTURES): VITD: 29.28 ng/mL — ABNORMAL LOW (ref 30.00–100.00)

## 2017-06-21 LAB — VITAMIN B12: Vitamin B-12: 991 pg/mL — ABNORMAL HIGH (ref 211–911)

## 2017-06-27 ENCOUNTER — Encounter: Payer: Self-pay | Admitting: Obstetrics & Gynecology

## 2017-08-01 ENCOUNTER — Other Ambulatory Visit: Payer: Self-pay | Admitting: Internal Medicine

## 2017-08-01 ENCOUNTER — Telehealth: Payer: Self-pay | Admitting: Internal Medicine

## 2017-08-01 DIAGNOSIS — E042 Nontoxic multinodular goiter: Secondary | ICD-10-CM

## 2017-08-01 NOTE — Telephone Encounter (Signed)
I believe she means she needs an order. Please advise. Thank you!

## 2017-08-01 NOTE — Telephone Encounter (Signed)
Patient stated she is calling to get scheduled for a ultra sound of her thyroid nodules

## 2017-08-10 ENCOUNTER — Other Ambulatory Visit: Payer: Self-pay | Admitting: Family Medicine

## 2017-08-12 ENCOUNTER — Ambulatory Visit
Admission: RE | Admit: 2017-08-12 | Discharge: 2017-08-12 | Disposition: A | Discharge status: Home / Self Care | Payer: BLUE CROSS/BLUE SHIELD | Source: Ambulatory Visit | Attending: Internal Medicine | Admitting: Internal Medicine

## 2017-08-12 DIAGNOSIS — E042 Nontoxic multinodular goiter: Secondary | ICD-10-CM | POA: Diagnosis not present

## 2017-09-09 ENCOUNTER — Other Ambulatory Visit: Payer: Self-pay | Admitting: Family Medicine

## 2017-10-03 ENCOUNTER — Encounter: Payer: Self-pay | Admitting: Family Medicine

## 2017-12-10 ENCOUNTER — Ambulatory Visit: Payer: BLUE CROSS/BLUE SHIELD | Admitting: Family Medicine

## 2017-12-30 NOTE — Progress Notes (Signed)
HPI:  Using dictation device. Unfortunately this device frequently misinterprets words/phrases.  Here for CPE:  -Concerns and/or follow up today:   Ashley Allen is a pleasant 47 y.o. here for CPE. Chronic medical problems summarized below were reviewed for changes.  Reports is doing well.  She is frustrated that she did not lose weight over the winter, she is going to try to improve in terms of eating healthier and getting more exercise.  She would like to check her labs today.  She has a history of very mildly elevated cholesterol.  She needs a refill on her Effexor and her Valtrex.  Wishes to continue both.  Uses the Valtrex episodically.  Reports mood is good. See PHQ9. Denies CP, SOB, DOE, treatment intolerance or new symptoms.   She did her mammogram with her gynecologist and reports this up-to-date  Depression and anxiety: -started effexor 12/07/2015 -doing well now, wishes to continue effexor -did counseling  BMI 28: -plans to start eating better and increase exercise  GERD: -uses prilosec '40mg'$  prn -hx ulcer, treated -has occ reflux if eats the wrong thing  Thyroid nodules/Hx hypothyroidism: - sees endo, Dr. Cruzita Lederer  FH colon cancer: -pat aunt 58 yo colon ca -maternal aunt 46s yo gastric ca -Referred to GI in 2018 by her gynecologist, but her insurance would not cover so she opted not to do per her report -denies bleeding, bowel changes   Hx melanoma: -sees dermatologist for regular follow up and management  -Diet: variety of foods, balance and well rounded, larger portion sizes -Exercise: no regular exercise -Taking folic acid, vitamin D or calcium: no -Diabetes and Dyslipidemia Screening: labs today -Vaccines: see vaccine section EPIC -pap history: sees gyn -FDLMP: see nursing notes -wants STI testing (Hep C if born [birth year]): declined hiv testing -FH breast, colon or ovarian ca: see FH Last mammogram: sees gyn, reports up to day Last colon cancer  screening: n/a - see above Breast Ca Risk Assessment: see family history and pt history  -Alcohol, Tobacco, drug use: see social history  Review of Systems - no reported fevers, unintentional weight loss, vision loss, hearing loss, chest pain, sob, hemoptysis, melena, hematochezia, hematuria, genital discharge, changing or concerning skin lesions, bleeding, bruising, loc, thoughts of self harm or SI  Past Medical History:  Diagnosis Date  . Anxiety   . Chicken pox   . Depression   . GERD (gastroesophageal reflux disease)   . Hx of melanoma of skin 06/20/2017   Sees dermatologist  . Thyroid nodule    benign  . Ulcer   . UTI (urinary tract infection)     Past Surgical History:  Procedure Laterality Date  . BREAST ENHANCEMENT SURGERY  2007  . NASAL SINUS SURGERY  ?2010    Family History  Problem Relation Age of Onset  . Alcohol abuse Father   . Arthritis Mother   . Arthritis Father   . Uterine cancer Other   . Colon cancer Other   . Hyperlipidemia Mother   . Heart disease Father   . Hypertension Mother   . Hypertension Father   . Sudden death Paternal Grandmother   . Cancer Other        Aunts  . Emphysema Father        heavy smoker    Social History   Socioeconomic History  . Marital status: Single    Spouse name: Not on file  . Number of children: 0  . Years of education: 53  . Highest education  level: Not on file  Occupational History  . Occupation: Scientist, clinical (histocompatibility and immunogenetics): [employer]  Social Needs  . Financial resource strain: Not on file  . Food insecurity:    Worry: Not on file    Inability: Not on file  . Transportation needs:    Medical: Not on file    Non-medical: Not on file  Tobacco Use  . Smoking status: Former Smoker    Years: 3.00    Types: Cigarettes  . Smokeless tobacco: Never Used  . Tobacco comment: previous social smoker   Substance and Sexual Activity  . Alcohol use: Yes  . Drug use: No  . Sexual activity: Not on file  Lifestyle  .  Physical activity:    Days per week: Not on file    Minutes per session: Not on file  . Stress: Not on file  Relationships  . Social connections:    Talks on phone: Not on file    Gets together: Not on file    Attends religious service: Not on file    Active member of club or organization: Not on file    Attends meetings of clubs or organizations: Not on file    Relationship status: Not on file  Other Topics Concern  . Not on file  Social History Narrative   Regular exercise-yes   Caffeine Use-yes     Current Outpatient Medications:  .  fluticasone (FLONASE) 50 MCG/ACT nasal spray, USE 1 SPRAY INTO EACH NOSTRIL TWICE DAILY, Disp: 16 g, Rfl: 3 .  gabapentin (NEURONTIN) 100 MG capsule, Take 2 capsules (200 mg total) by mouth at bedtime., Disp: 60 capsule, Rfl: 3 .  levonorgestrel (MIRENA) 20 MCG/24HR IUD, 1 each by Intrauterine route once., Disp: , Rfl:  .  NON FORMULARY, Inflavonoid Intensive Care, Disp: , Rfl:  .  omeprazole (PRILOSEC) 20 MG capsule, TAKE 1 CAPSULE (20 MG TOTAL) BY MOUTH DAILY., Disp: 30 capsule, Rfl: 5 .  valACYclovir (VALTREX) 500 MG tablet, TAKE 1 TABLET BY MOUTH EVERY DAY, Disp: 30 tablet, Rfl: 1 .  venlafaxine XR (EFFEXOR-XR) 75 MG 24 hr capsule, TAKE 1 CAPSULE (75 MG TOTAL) BY MOUTH DAILY WITH BREAKFAST., Disp: 90 capsule, Rfl: 1  EXAM:  Vitals:   12/31/17 1126  BP: 110/70  Pulse: 93  Temp: 98.1 F (36.7 C)    GENERAL: vitals reviewed and listed below, alert, oriented, appears well hydrated and in no acute distress  HEENT: head atraumatic, PERRLA, normal appearance of eyes, ears, nose and mouth. moist mucus membranes.  NECK: supple, no masses or lymphadenopathy  LUNGS: clear to auscultation bilaterally, no rales, rhonchi or wheeze  CV: HRRR, no peripheral edema or cyanosis, normal pedal pulses  ABDOMEN: bowel sounds normal, soft, non tender to palpation, no masses, no rebound or guarding  GU/BREAST: does with gyn  SKIN: no rash or abnormal  lesions - does skin exams with dermatology  MS: normal gait, moves all extremities normally  NEURO: normal gait, speech and thought processing grossly intact, muscle tone grossly intact throughout  PSYCH: normal affect, pleasant and cooperative  ASSESSMENT AND PLAN:  Discussed the following assessment and plan:  PREVENTIVE EXAM: -Discussed and advised all [place] preventive services health task force level A and B recommendations for age, sex and risks. -Advised at least 150 minutes of exercise per week and a healthy diet with avoidance of (less then 1 serving per week) processed foods, white starches, red meat, fast foods and sweets and consisting of: * 5-9  servings of fresh fruits and vegetables (not corn or potatoes) *nuts and seeds, beans *olives and olive oil *lean meats such as fish and white chicken  *whole grains -labs, studies and vaccines per orders this encounter - Hemoglobin A1c -Discussed colon cancer screening guidelines, she may consider seeing GI as she thought she was supposed to have colon cancer screening early, she is going to call us if she needs a referral for this  2. Dyslipidemia -Lifestyle recommendations - Lipid panel  3. B12 deficiency - Vitamin B12  4. Vitamin D deficiency - VITAMIN D 25 Hydroxy (Vit-D Deficiency, Fractures)  5. Depression, recurrent ([place]) -Stable, medication refilled  6. Anxiety -Stable, medication refilled  7. H/O: hypothyroidism 8. Multiple thyroid nodules -Sees Dr. Ethelene Hal for management   Patient advised to return to clinic immediately if symptoms worsen or persist or new concerns.  Patient Instructions  BEFORE YOU LEAVE: -labs -abstract mammogram asap -follow up: schedule follow up in 6 months  We have ordered labs or studies at this visit. It can take up to 1-2 weeks for results and processing. IF results require follow up or explanation, we will call you with instructions. Clinically stable results will be released  to your [facility]. If you have not heard from [place] or cannot find your results in [facility] in 2 weeks please contact our office at [phone].  If you are not yet signed up for [facility], please consider signing up.  See your dermatologist and gynecologist for regular follow up/exams as planned  Call us if you decide on a referral to GI.   Preventive Care 40-64 Years, Female Preventive care refers to lifestyle choices and visits with your health care provider that can promote health and wellness. What does preventive care include?  A yearly physical exam. This is also called an annual well check.  Dental exams once or twice a year.  Routine eye exams. Ask your health care provider how often you should have your eyes checked.  Personal lifestyle choices, including: ? Daily care of your teeth and gums. ? Regular physical activity. ? Eating a healthy diet. ? Avoiding tobacco and drug use. ? Limiting alcohol use. ? Practicing safe sex. ? Taking low-dose aspirin daily starting at age 32. ? Taking vitamin and mineral supplements as recommended by your health care provider. What happens during an annual well check? The services and screenings done by your health care provider during your annual well check will depend on your age, overall health, lifestyle risk factors, and family history of disease. Counseling Your health care provider may ask you questions about your:  Alcohol use.  Tobacco use.  Drug use.  Emotional well-being.  Home and relationship well-being.  Sexual activity.  Eating habits.  Work and work Statistician.  Method of birth control.  Menstrual cycle.  Pregnancy history.  Screening You may have the following tests or measurements:  Height, weight, and BMI.  Blood pressure.  Lipid and cholesterol levels. These may be checked every 5 years, or more frequently if you are over 32 years old.  Skin check.  Lung cancer screening. You may have this  screening every year starting at age 45 if you have a 30-pack-year history of smoking and currently smoke or have quit within the past 15 years.  Fecal occult blood test (FOBT) of the stool. You may have this test every year starting at age 65.  Flexible sigmoidoscopy or colonoscopy. You may have a sigmoidoscopy every 5 years or a colonoscopy every 10  years starting at age 85.  Hepatitis C blood test.  Hepatitis B blood test.  Sexually transmitted disease (STD) testing.  Diabetes screening. This is done by checking your blood sugar (glucose) after you have not eaten for a while (fasting). You may have this done every 1-3 years.  Mammogram. This may be done every 1-2 years. Talk to your health care provider about when you should start having regular mammograms. This may depend on whether you have a family history of breast cancer.  BRCA-related cancer screening. This may be done if you have a family history of breast, ovarian, tubal, or peritoneal cancers.  Pelvic exam and Pap test. This may be done every 3 years starting at age 42. Starting at age 1, this may be done every 5 years if you have a Pap test in combination with an HPV test.  Bone density scan. This is done to screen for osteoporosis. You may have this scan if you are at high risk for osteoporosis.  Discuss your test results, treatment options, and if necessary, the need for more tests with your health care provider. Vaccines Your health care provider may recommend certain vaccines, such as:  Influenza vaccine. This is recommended every year.  Tetanus, diphtheria, and acellular pertussis (Tdap, Td) vaccine. You may need a Td booster every 10 years.  Varicella vaccine. You may need this if you have not been vaccinated.  Zoster vaccine. You may need this after age 73.  Measles, mumps, and rubella (MMR) vaccine. You may need at least one dose of MMR if you were born in 1957 or later. You may also need a second  dose.  Pneumococcal 13-valent conjugate (PCV13) vaccine. You may need this if you have certain conditions and were not previously vaccinated.  Pneumococcal polysaccharide (PPSV23) vaccine. You may need one or two doses if you smoke cigarettes or if you have certain conditions.  Meningococcal vaccine. You may need this if you have certain conditions.  Hepatitis A vaccine. You may need this if you have certain conditions or if you travel or work in places where you may be exposed to hepatitis A.  Hepatitis B vaccine. You may need this if you have certain conditions or if you travel or work in places where you may be exposed to hepatitis B.  Haemophilus influenzae type b (Hib) vaccine. You may need this if you have certain conditions.  Talk to your health care provider about which screenings and vaccines you need and how often you need them. This information is not intended to replace advice given to you by your health care provider. Make sure you discuss any questions you have with your health care provider. Document Released: 10/07/2015 Document Revised: 05/30/2016 Document Reviewed: 07/12/2015 Elsevier Interactive Patient Education  2018 [employer].         No follow-ups on file.  Lucretia Kern, DO

## 2017-12-31 ENCOUNTER — Encounter: Payer: Self-pay | Admitting: Family Medicine

## 2017-12-31 ENCOUNTER — Ambulatory Visit (INDEPENDENT_AMBULATORY_CARE_PROVIDER_SITE_OTHER): Payer: BLUE CROSS/BLUE SHIELD | Admitting: Family Medicine

## 2017-12-31 VITALS — BP 110/70 | HR 93 | Temp 98.1°F | Ht 64.5 in | Wt 170.4 lb

## 2017-12-31 DIAGNOSIS — F419 Anxiety disorder, unspecified: Secondary | ICD-10-CM

## 2017-12-31 DIAGNOSIS — E785 Hyperlipidemia, unspecified: Secondary | ICD-10-CM | POA: Diagnosis not present

## 2017-12-31 DIAGNOSIS — E559 Vitamin D deficiency, unspecified: Secondary | ICD-10-CM

## 2017-12-31 DIAGNOSIS — E538 Deficiency of other specified B group vitamins: Secondary | ICD-10-CM

## 2017-12-31 DIAGNOSIS — Z8639 Personal history of other endocrine, nutritional and metabolic disease: Secondary | ICD-10-CM

## 2017-12-31 DIAGNOSIS — F339 Major depressive disorder, recurrent, unspecified: Secondary | ICD-10-CM

## 2017-12-31 DIAGNOSIS — Z0000 Encounter for general adult medical examination without abnormal findings: Secondary | ICD-10-CM | POA: Diagnosis not present

## 2017-12-31 DIAGNOSIS — E042 Nontoxic multinodular goiter: Secondary | ICD-10-CM | POA: Diagnosis not present

## 2017-12-31 LAB — LIPID PANEL
Cholesterol: 185 mg/dL (ref 0–200)
HDL: 46.7 mg/dL (ref >39.00)
LDL Cholesterol: 115 mg/dL — ABNORMAL HIGH (ref 0–99)
NonHDL: 138.32
Total CHOL/HDL Ratio: 4
Triglycerides: 119 mg/dL (ref 0.0–149.0)
VLDL: 23.8 mg/dL (ref 0.0–40.0)

## 2017-12-31 LAB — HEMOGLOBIN A1C: Hgb A1c MFr Bld: 5.3 % (ref 4.6–6.5)

## 2017-12-31 LAB — VITAMIN B12: Vitamin B-12: 1159 pg/mL — ABNORMAL HIGH (ref 211–911)

## 2017-12-31 LAB — VITAMIN D 25 HYDROXY (VIT D DEFICIENCY, FRACTURES): VITD: 32.56 ng/mL (ref 30.00–100.00)

## 2017-12-31 MED ORDER — VALACYCLOVIR HCL 500 MG PO TABS: ORAL_TABLET | ORAL | 1 refills | Status: DC

## 2017-12-31 MED ORDER — VENLAFAXINE HCL ER 75 MG PO CP24: 75.0000 mg | ORAL_CAPSULE | Freq: Every day | ORAL | 1 refills | Status: DC

## 2017-12-31 NOTE — Patient Instructions (Signed)
BEFORE YOU LEAVE: -labs -abstract mammogram asap -follow up: schedule follow up in 6 months  We have ordered labs or studies at this visit. It can take up to 1-2 weeks for results and processing. IF results require follow up or explanation, we will call you with instructions. Clinically stable results will be released to your [facility]. If you have not heard from [place] or cannot find your results in [facility] in 2 weeks please contact our office at [phone].  If you are not yet signed up for [facility], please consider signing up.  See your dermatologist and gynecologist for regular follow up/exams as planned  Call us if you decide on a referral to GI.   Preventive Care 40-64 Years, Female Preventive care refers to lifestyle choices and visits with your health care provider that can promote health and wellness. What does preventive care include?  A yearly physical exam. This is also called an annual well check.  Dental exams once or twice a year.  Routine eye exams. Ask your health care provider how often you should have your eyes checked.  Personal lifestyle choices, including: ? Daily care of your teeth and gums. ? Regular physical activity. ? Eating a healthy diet. ? Avoiding tobacco and drug use. ? Limiting alcohol use. ? Practicing safe sex. ? Taking low-dose aspirin daily starting at age 84. ? Taking vitamin and mineral supplements as recommended by your health care provider. What happens during an annual well check? The services and screenings done by your health care provider during your annual well check will depend on your age, overall health, lifestyle risk factors, and family history of disease. Counseling Your health care provider may ask you questions about your:  Alcohol use.  Tobacco use.  Drug use.  Emotional well-being.  Home and relationship well-being.  Sexual activity.  Eating habits.  Work and work Statistician.  Method of birth  control.  Menstrual cycle.  Pregnancy history.  Screening You may have the following tests or measurements:  Height, weight, and BMI.  Blood pressure.  Lipid and cholesterol levels. These may be checked every 5 years, or more frequently if you are over 61 years old.  Skin check.  Lung cancer screening. You may have this screening every year starting at age 30 if you have a 30-pack-year history of smoking and currently smoke or have quit within the past 15 years.  Fecal occult blood test (FOBT) of the stool. You may have this test every year starting at age 36.  Flexible sigmoidoscopy or colonoscopy. You may have a sigmoidoscopy every 5 years or a colonoscopy every 10 years starting at age 70.  Hepatitis C blood test.  Hepatitis B blood test.  Sexually transmitted disease (STD) testing.  Diabetes screening. This is done by checking your blood sugar (glucose) after you have not eaten for a while (fasting). You may have this done every 1-3 years.  Mammogram. This may be done every 1-2 years. Talk to your health care provider about when you should start having regular mammograms. This may depend on whether you have a family history of breast cancer.  BRCA-related cancer screening. This may be done if you have a family history of breast, ovarian, tubal, or peritoneal cancers.  Pelvic exam and Pap test. This may be done every 3 years starting at age 32. Starting at age 26, this may be done every 5 years if you have a Pap test in combination with an HPV test.  Bone density scan. This  is done to screen for osteoporosis. You may have this scan if you are at high risk for osteoporosis.  Discuss your test results, treatment options, and if necessary, the need for more tests with your health care provider. Vaccines Your health care provider may recommend certain vaccines, such as:  Influenza vaccine. This is recommended every year.  Tetanus, diphtheria, and acellular pertussis (Tdap,  Td) vaccine. You may need a Td booster every 10 years.  Varicella vaccine. You may need this if you have not been vaccinated.  Zoster vaccine. You may need this after age 64.  Measles, mumps, and rubella (MMR) vaccine. You may need at least one dose of MMR if you were born in 1957 or later. You may also need a second dose.  Pneumococcal 13-valent conjugate (PCV13) vaccine. You may need this if you have certain conditions and were not previously vaccinated.  Pneumococcal polysaccharide (PPSV23) vaccine. You may need one or two doses if you smoke cigarettes or if you have certain conditions.  Meningococcal vaccine. You may need this if you have certain conditions.  Hepatitis A vaccine. You may need this if you have certain conditions or if you travel or work in places where you may be exposed to hepatitis A.  Hepatitis B vaccine. You may need this if you have certain conditions or if you travel or work in places where you may be exposed to hepatitis B.  Haemophilus influenzae type b (Hib) vaccine. You may need this if you have certain conditions.  Talk to your health care provider about which screenings and vaccines you need and how often you need them. This information is not intended to replace advice given to you by your health care provider. Make sure you discuss any questions you have with your health care provider. Document Released: 10/07/2015 Document Revised: 05/30/2016 Document Reviewed: 07/12/2015 Elsevier Interactive Patient Education  [employer].

## 2018-01-10 ENCOUNTER — Other Ambulatory Visit: Payer: Self-pay | Admitting: Family Medicine

## 2018-03-12 ENCOUNTER — Other Ambulatory Visit: Payer: Self-pay | Admitting: Family Medicine

## 2018-03-31 ENCOUNTER — Ambulatory Visit (INDEPENDENT_AMBULATORY_CARE_PROVIDER_SITE_OTHER): Payer: BLUE CROSS/BLUE SHIELD | Admitting: Internal Medicine

## 2018-03-31 ENCOUNTER — Encounter: Payer: Self-pay | Admitting: Internal Medicine

## 2018-03-31 VITALS — BP 130/80 | HR 87 | Ht 64.5 in | Wt 172.4 lb

## 2018-03-31 DIAGNOSIS — E042 Nontoxic multinodular goiter: Secondary | ICD-10-CM | POA: Diagnosis not present

## 2018-03-31 DIAGNOSIS — Z8639 Personal history of other endocrine, nutritional and metabolic disease: Secondary | ICD-10-CM

## 2018-03-31 DIAGNOSIS — D1801 Hemangioma of skin and subcutaneous tissue: Secondary | ICD-10-CM | POA: Diagnosis not present

## 2018-03-31 DIAGNOSIS — E538 Deficiency of other specified B group vitamins: Secondary | ICD-10-CM

## 2018-03-31 DIAGNOSIS — E559 Vitamin D deficiency, unspecified: Secondary | ICD-10-CM | POA: Diagnosis not present

## 2018-03-31 DIAGNOSIS — D2262 Melanocytic nevi of left upper limb, including shoulder: Secondary | ICD-10-CM | POA: Diagnosis not present

## 2018-03-31 DIAGNOSIS — Z85828 Personal history of other malignant neoplasm of skin: Secondary | ICD-10-CM | POA: Diagnosis not present

## 2018-03-31 DIAGNOSIS — Z85820 Personal history of malignant melanoma of skin: Secondary | ICD-10-CM | POA: Diagnosis not present

## 2018-03-31 DIAGNOSIS — L570 Actinic keratosis: Secondary | ICD-10-CM | POA: Diagnosis not present

## 2018-03-31 LAB — TSH: TSH: 0.72 u[IU]/mL (ref 0.35–4.50)

## 2018-03-31 NOTE — Progress Notes (Signed)
Subjective:     Patient ID: Ashley Allen, female   DOB: 1970-11-16, 47 y.o.   MRN: [id number]  HPI Ashley Allen is a 47 y.o.y/o woman returning for follow-up for h/o hypothyroidism and thyroid nodules. Last visit 1 year ago.  Hypothyroidism: R reviewed history: She was started on Synthroid by her previous endocrinologist in 2008, for goiter, but she does not remember having blood work or thyroid U/S done, so it is unclear whether this was started for hypothyroidism or in an attempt to decrease the size of a thyroid nodule that was palpated at that time.   She took the medication for about 6 months then, then stopped it 8 years ago per [employer]. She had normal TSH (0.39 on 07/03/2012 - LLN 0.35) despite being off Synthroid. She did not feel different after starting or after stopping the medication.   At her visit with her PCP, in 08/18/2012, she had multiple vague complaints: fatigue, wt gain, constipation, feeling cold. She was wondering whether she needed to restart Synthroid at that time so she was referred to endocrinology.  After her moved to [facility], she started to see a naturopath, who started Cytomel 10 mg bid in 07/2014 >> she felt well, but did not know whether this is mandatory medication so she tapered it off, now completely off for last 2 weeks.   I reviewed thyroid labs from her naturopath: 11/03/2014, while on Cytomel: TSH 0.02 (0.4-4.5), free T4 0.9 (0.8-1.8), free T3 4.2 (2.3-4.2), reverse T3 11 (8-25), serum iodine 44 (50-109). A note was made that the thyroid status was "perfect".  Normal TFTs: Lab Results  Component Value Date   TSH 0.82 04/08/2017   TSH 0.62 12/07/2015   TSH 0.87 04/22/2015   TSH 0.85 03/03/2015   TSH 0.921 08/28/2012   TSH 0.39 07/03/2012   FREET4 0.77 04/08/2017   FREET4 0.90 04/22/2015   FREET4 0.81 03/03/2015   FREET4 1.25 08/28/2012   Component     Latest Ref Rng & Units 1 08/28/2012:  TSI     <140 % baseline 75  Thyroperoxidase Ab  SerPl-aCnc     <35.0 IU/mL 26.3   Thyroid nodules:  Patient had a thyroid U/S (05/2014): 2 thyroid nodules:  Isoechoic nodule at right superior pole and isoechoicnodule at right inferior. Normal vascularity, no calcification.   Right upper: 1.6 x 1.0 x 1.4 cm, right lower: 2.3 x 1.8 x 1.9 cm    FNA (06/21/2014): 1. Thyroid nodule, right upper, FNA (smears):  - Bethesda category: 2Benign.  - Benign follicular nodule, consistent with colloid nodule.    2. Thyroid nodule, right lower, FNA (smears):  - Bethesda category: 2Benign.  - Benign follicular nodule, consistent with colloid nodule.    Thyroid U/S (07/10/2016): Isthmus: 0.5 cm. No discrete nodules are identified within the thyroid isthmus. Right lobe: 6.5 x 2.1 x 2.3 cm Nodule # 1: Location: Right; Mid Size: More discrete nodular area measures 1.4 x 1.0 x 1.1 cm. Additional heterogeneous tissue present when included conglomerate nodular region measures as much as 3.0 x 1.1 x 2.1 cm. Composition: solid/almost completely solid (2) Echogenicity: isoechoic (1) *Given size (>/= 1.5 - 2.4 cm) and appearance, a follow-up ultrasound in 1 year should be considered based on TI-RADS criteria. Left lobe: 5.5 x 1.2 x 2.0 cm No discrete nodules are identified within the left lobe of the thyroid.  IMPRESSION: Nodular solid region within the right lobe of the thyroid gland. This nodular abnormality is very difficult to measure  due to adjacent heterogeneous tissue. Discrete nodule may measure is little as 1.4 cm or as much as 3 cm. Correlation suggested with prior imaging and biopsy results. Given size discrepancy, the best approach would likely be a follow-up ultrasound in 1 year.  Thyroid U/S (08/12/2017):  Isthmus: 0.3 cm, previously 0.5 cm Right lobe: 6.3 x 1.7 x 2.4 cm, previously 6.4 x 2.1 x 2.3 cm Left lobe: 6.3 x 1.0 x 2.0 cm, previously 5.5 x 1.2 x 2.0 cm ________________________________________  Nodule #  1: Prior biopsy: No Location: Right; Mid Maximum size: 1.0 cm; Other 2 dimensions: 0.9 x 0.7 cm, previously, 1.4 x 1.0 x 1.1 cm Composition: solid/almost completely solid (2) Echogenicity: hypoechoic (2) ACR TI-RADS recommendations: *Given size (>/= 1 - 1.4 cm) and appearance, a follow-up ultrasound in 1 year should be considered based on TI-RADS criteria.  _________________________________________________________   Nodule # 2: Prior biopsy: No Location: Right; Inferior Maximum size: 3.0 cm; Other 2 dimensions: 1.7 x 1.1 cm, previously, 3.0 x 1.1 x 2.1 cm Composition: solid/almost completely solid (2) Echogenicity: hypoechoic (2)  ACR TI-RADS recommendations: **Given size (>/= 1.5 cm) and appearance, fine needle aspiration of this moderately suspicious nodule should be considered based on TI-RADS criteria. _________________________________________________________  IMPRESSION: Right nodule 1 is smaller or but now meets criteria for annual follow-up. It has changed in appearance and is now a TR4 lesion. Right nodule 2 is stable and continues to meet criteria for fine needle aspiration biopsy.  Pt denies: - feeling nodules in neck - hoarseness - dysphagia - choking - SOB with lying down  Mother and cousin have Graves' disease.Marland Kitchen No FH of thyroid cancer. No h/o radiation tx to head or neck.  No seaweed or kelp. No recent contrast studies. No herbal supplements. No Biotin use. No recent steroids use.  + see HPI  Vitamin B12 and D were low at last visit, but normalized after starting supplementation.  Lab Results  Component Value Date   VITAMINB12 1,159 (H) 12/31/2017   VITAMINB12 991 (H) 06/20/2017   VITAMINB12 296 04/08/2017  We started 1000 mcg vitamin B12 daily at last visit.  Lab Results  Component Value Date   VD25OH 32.56 12/31/2017   VD25OH 29.28 (L) 06/20/2017   VD25OH 24.70 (L) 04/08/2017   VD25OH 50 07/03/2012  We started 2000 units daily at last  visit.   ROS: Constitutional: no weight gain/no weight loss, no fatigue, no subjective hyperthermia, no subjective hypothermia Eyes: no blurry vision, no xerophthalmia ENT: no sore throat,  Cardiovascular: no CP/no SOB/no palpitations/no leg swelling Respiratory: no cough/no SOB/no wheezing Gastrointestinal: no N/no V/no D/no C/no acid reflux Musculoskeletal: no muscle aches/no joint aches Skin: no rashes, no hair loss Neurological: no tremors/no numbness/no tingling/no dizziness  I reviewed pt's medications, allergies, PMH, social hx, family hx, and changes were documented in the history of present illness. Otherwise, unchanged from my initial visit note.   Past Medical History:  Diagnosis Date  . Anxiety   . Chicken pox   . Depression   . GERD (gastroesophageal reflux disease)   . Hx of melanoma of skin 06/20/2017   Sees dermatologist  . Thyroid nodule    benign  . Ulcer   . UTI (urinary tract infection)    Past Surgical History:  Procedure Laterality Date  . BREAST ENHANCEMENT SURGERY  2007  . NASAL SINUS SURGERY  ?2010   History   Social History  . Marital Status: Single    Spouse Name: N/A  Number of Children: 0  . Years of Education: 6316, college   Occupational History  . [employer]   Social History Main Topics  . Smoking status: Former Games developermoker  . Smokeless tobacco: Not on file     Comment: previous social smoker   . Alcohol Use: Yes  . Drug Use: No   Current Outpatient Medications on File Prior to Visit  Medication Sig Dispense Refill  . fluticasone (FLONASE) 50 MCG/ACT nasal spray USE 1 SPRAY INTO EACH NOSTRIL TWICE DAILY 16 g 3  . levonorgestrel (MIRENA) 20 MCG/24HR IUD 1 each by Intrauterine route once.    . NON FORMULARY Inflavonoid Intensive Care    . omeprazole (PRILOSEC) 20 MG capsule TAKE 1 CAPSULE (20 MG TOTAL) BY MOUTH DAILY. 30 capsule 5  . valACYclovir (VALTREX) 500 MG tablet 500mg  bid x 3 days at onset symptoms. 30 tablet 1  .  venlafaxine XR (EFFEXOR-XR) 75 MG 24 hr capsule Take 1 capsule (75 mg total) by mouth daily with breakfast. 90 capsule 1   No current facility-administered medications on file prior to visit.    Allergies  Allergen Reactions  . Polysporin [Bacitracin-Polymyxin B]   . Sulfonamide Derivatives     REACTION: Itching   Family History  Problem Relation Age of Onset  . Alcohol abuse Father   . Arthritis Mother   . Arthritis Father   . Uterine cancer Other   . Colon cancer Other   . Hyperlipidemia Mother   . Heart disease Father   . Hypertension Mother   . Hypertension Father   . Sudden death Paternal Grandmother   . Cancer Other        Aunts  . Emphysema Father        heavy smoker   Review of Systems  Constitutional: no weight gain/no weight loss, no fatigue, no subjective hyperthermia, no subjective hypothermia Eyes: no blurry vision, no xerophthalmia ENT: no sore throat, no nodules palpated in throat, no dysphagia, no odynophagia, no hoarseness Cardiovascular: no CP/no SOB/no palpitations/no leg swelling Respiratory: no cough/no SOB/no wheezing Gastrointestinal: no N/no V/no D/no C/no acid reflux Musculoskeletal: no muscle aches/no joint aches Skin: no rashes, no hair loss Neurological: no tremors/no numbness/no tingling/no dizziness  I reviewed pt's medications, allergies, PMH, social hx, family hx, and changes were documented in the history of present illness. Otherwise, unchanged from my initial visit note.  Objective:   Physical Exam BP 130/80   Pulse 87   Ht 5' 4.5" (1.638 m)   Wt 172 lb 6.4 oz (78.2 kg)   SpO2 97%   BMI 29.14 kg/m  There is no height or weight on file to calculate BMI. Wt Readings from Last 3 Encounters:  03/31/18 172 lb 6.4 oz (78.2 kg)  12/31/17 170 lb 6.4 oz (77.3 kg)  06/20/17 169 lb 9.6 oz (76.9 kg)   Constitutional: overweight, in NAD Eyes: PERRLA, EOMI, no exophthalmos ENT: moist mucous membranes, no thyromegaly, no cervical  lymphadenopathy Cardiovascular: RRR, No MRG Respiratory: CTA B Gastrointestinal: abdomen soft, NT, ND, BS+ Musculoskeletal: no deformities, strength intact in all 4 Skin: moist, warm, no rashes Neurological: no tremor with outstretched hands, DTR normal in all 4  Assessment:     1. H/o Hypothyroidism - now euthyroid  2. 2 thyroid nodules    Plan:     1. H/o Hypothyroidism  - Reviewed labs obtained since 2013, which were all normal off the thyroid hormones - We will continue to check her thyroid hormones annually,  including today (will check a TSH)  2. Thyroid nodules - She has a history of 2 right thyroid nodules on the ultrasound obtained in 09/20175.  The images were not available for review.  We obtained another ultrasound in 06/2016 and this showed a conglomerate of nodules in the right thyroid lobe.  Of note, 2 prior biopsies were normal in 2015. We did not intervene at that time, but we rechecked another ultrasound  on 08/12/2017.  The nodules appeared not worrisome - one of the nodules changed from isoechoic to hypoechoic but it decreased in size, and we discussed that this is reassuring. No need to repeat the U/S now but plan to check another one 2 years from previous, in 10-07/2019. I plan to see her afterwards to review the images together. - No neck compression symptoms - RTC in 1 year  3.  Vitamin D insufficiency  - her vitamin D level checked at last visit was low, at 24.7. -  I advised her to start 2000 units vitamin D daily - Next vitamin D level normalized per last check by PCP  4. Low B12 - Her B12 was low, at 296 at last visit - I advised her to start B12 p.o. 1000 mcg daily - Last vitamin B12 was slightly above normal per checked by PCP  Office Visit on 03/31/2018  Component Date Value Ref Range Status  . TSH 03/31/2018 0.72  0.35 - 4.50 uIU/mL Final   TSH is normal.   Carlus Pavlov, MD PhD [facility] End[facility] .

## 2018-03-31 NOTE — Patient Instructions (Signed)
Please stop at the lab.  Please send me a message in 06/2019 to order a new U/S and come back for a visit in 07/2019.

## 2018-04-01 ENCOUNTER — Encounter: Payer: Self-pay | Admitting: Internal Medicine

## 2018-04-08 ENCOUNTER — Ambulatory Visit: Payer: BLUE CROSS/BLUE SHIELD | Admitting: Internal Medicine

## 2018-07-03 ENCOUNTER — Ambulatory Visit: Payer: BLUE CROSS/BLUE SHIELD | Admitting: Family Medicine

## 2018-07-17 IMAGING — US US THYROID
1 series · 13 of 25 positions shown · non-contrast
Comparison: 07/09/2016

CLINICAL DATA: Prior ultrasound follow-up.  Multiple nodules.

EXAM:
THYROID ULTRASOUND
TECHNIQUE: Ultrasound examination of the thyroid gland and adjacent soft
tissues was performed.

[Series 1: us thyroid · 0.04mm/px · 13 of 60 slices shown]
[im 1/60]
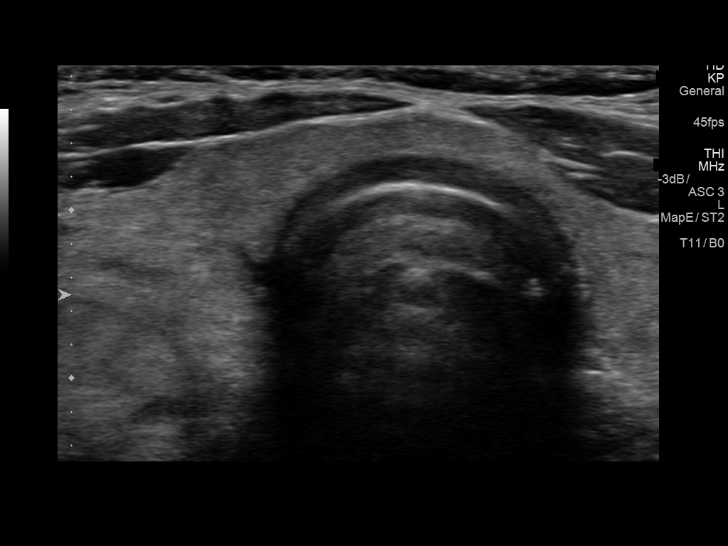
[im 5/60]
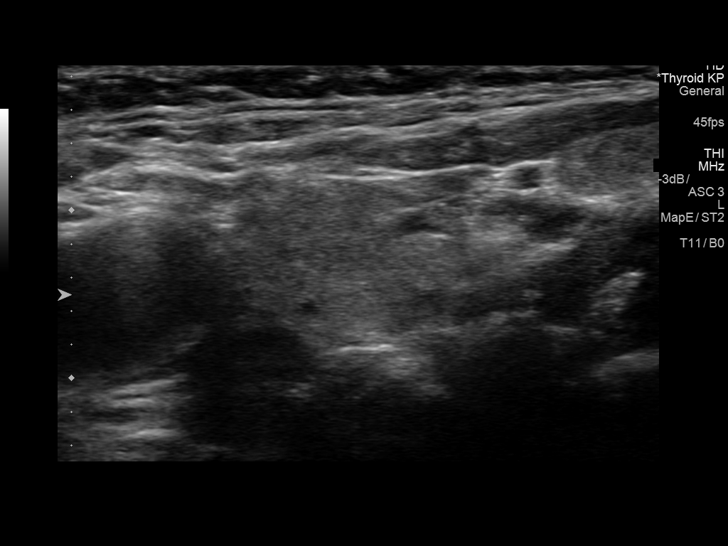
[im 10/60]
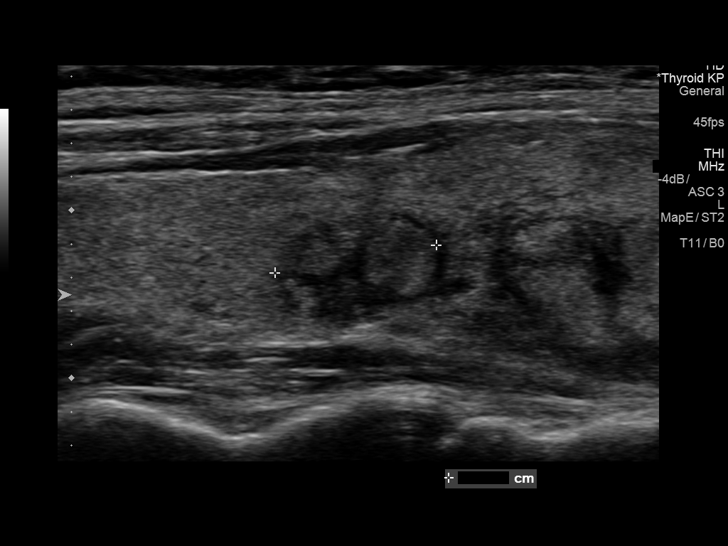
[im 15/60]
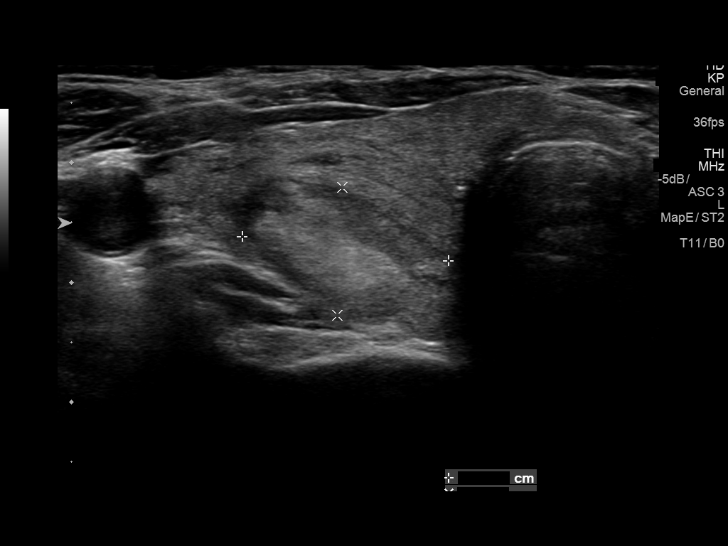
[im 20/60]
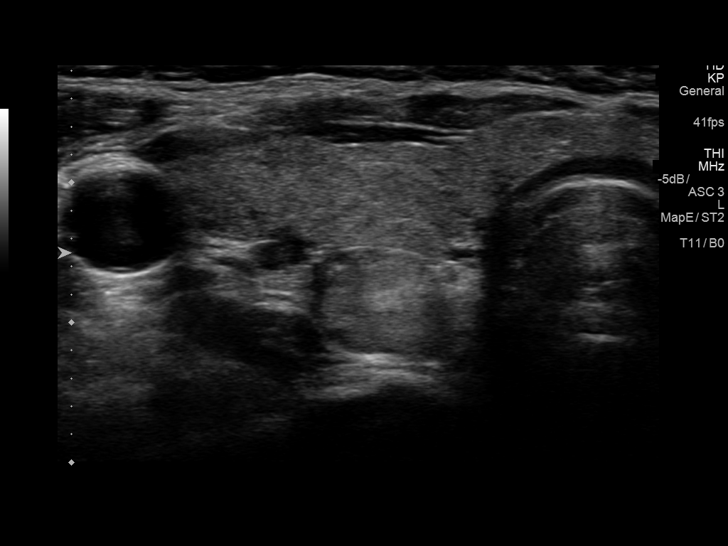
[im 25/60]
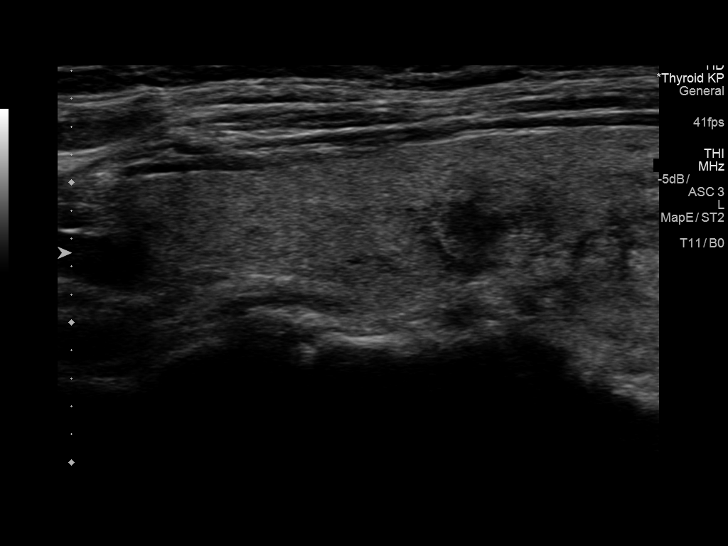
[im 30/60]
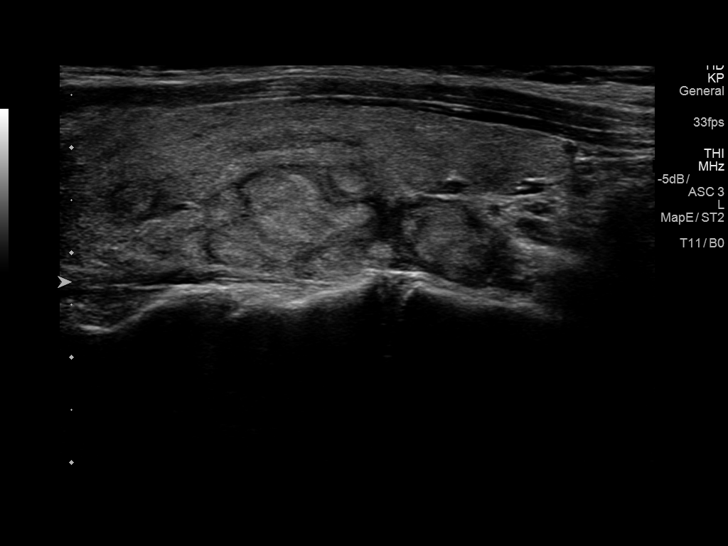
[im 35/60]
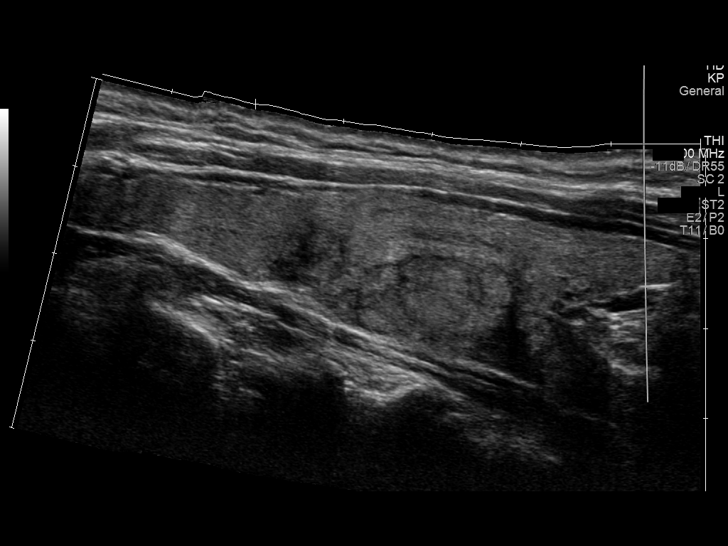
[im 40/60]
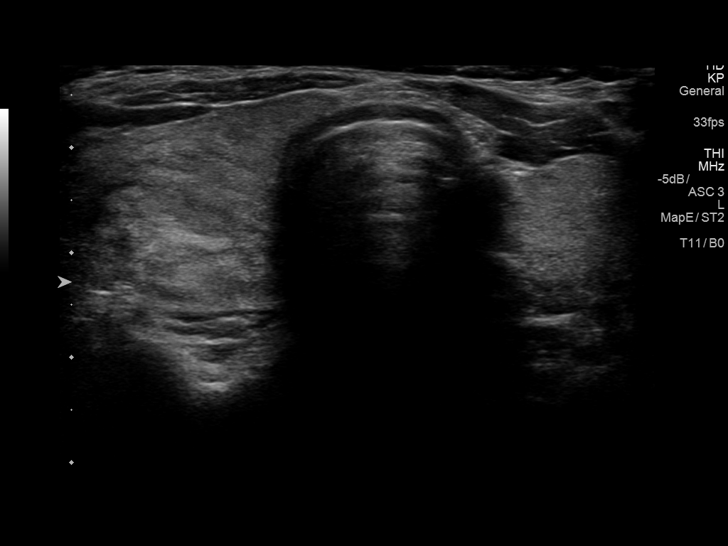
[im 45/60]
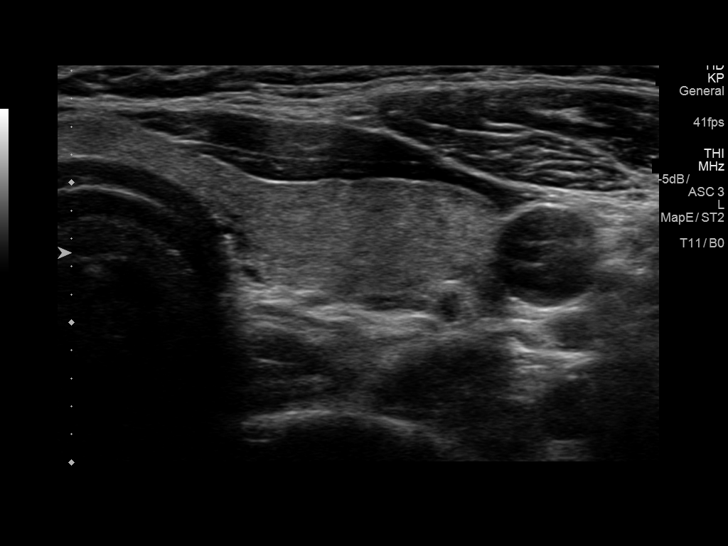
[im 50/60]
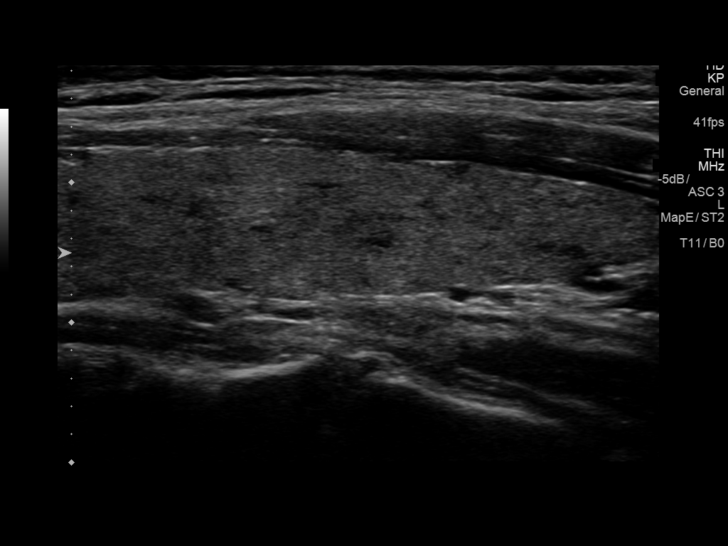
[im 55/60]
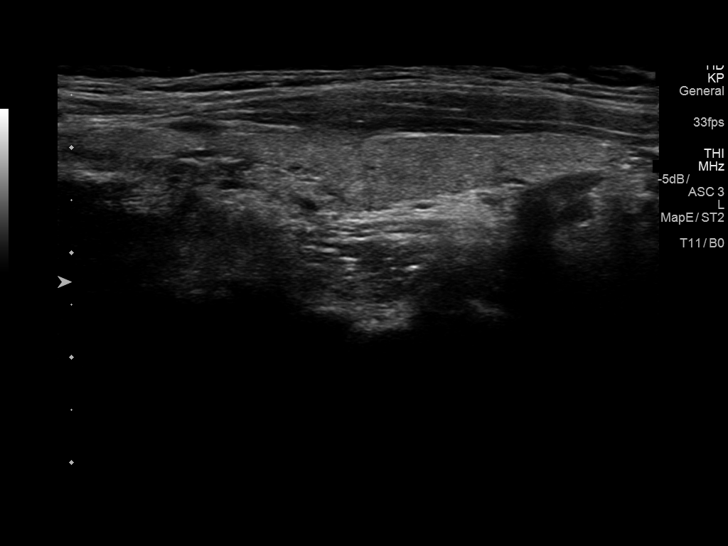
[im 60/60]
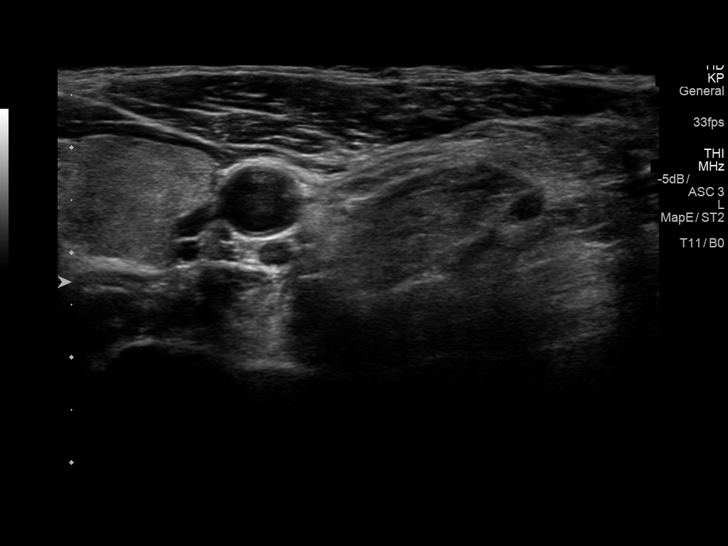

[13 of 25 positions shown; findings below may reference images not displayed]

FINDINGS: Parenchymal Echotexture: Moderately heterogenous

Isthmus: 0.3 cm, previously 0.5 cm

Right lobe: 6.3 x 1.7 x 2.4 cm, previously 6.4 x 2.1 x 2.3 cm

Left lobe: 6.3 x 1.0 x 2.0 cm, previously 5.5 x 1.2 x 2.0 cm

_________________________________________________________

Estimated total number of nodules >/= 1 cm: 2

Number of spongiform nodules >/=  2 cm not described below (TR1): 0

Number of mixed cystic and solid nodules >/= 1.5 cm not described
below (TR2): 0

_________________________________________________________

Nodule # 1:

Prior biopsy: No

Location: Right; Mid

Maximum size: 1.0 cm; Other 2 dimensions: 0.9 x 0.7 cm, previously,
1.4 x 1.0 x 1.1 cm

Composition: solid/almost completely solid (2)

Echogenicity: hypoechoic (2)

Shape: not taller-than-wide (0)

Margins: smooth (0)

Echogenic foci: none (0)

ACR TI-RADS total points: 4.

ACR TI-RADS risk category:  TR4 (4-6 points).

Significant change in size (>/= 20% in two dimensions and minimal
increase of 2 mm): No

Change in features: Yes

Change in ACR TI-RADS risk category: Yes

ACR TI-RADS recommendations:

*Given size (>/= 1 - 1.4 cm) and appearance, a follow-up ultrasound
in 1 year should be considered based on TI-RADS criteria.

_________________________________________________________

_________________________________________________________

Nodule # 2:

Prior biopsy: No

Location: Right; Inferior

Maximum size: 3.0 cm; Other 2 dimensions: 1.7 x 1.1 cm, previously,
3.0 x 1.1 x 2.1 cm

Composition: solid/almost completely solid (2)

Echogenicity: hypoechoic (2)

Shape: not taller-than-wide (0)

Margins: smooth (0)

Echogenic foci: none (0)

ACR TI-RADS total points: 4.

ACR TI-RADS risk category:  TR4 (4-6 points).

Significant change in size (>/= 20% in two dimensions and minimal
increase of 2 mm): No

Change in features: No

Change in ACR TI-RADS risk category: No

ACR TI-RADS recommendations:

**Given size (>/= 1.5 cm) and appearance, fine needle aspiration of
this moderately suspicious nodule should be considered based on
TI-RADS criteria.

_________________________________________________________
IMPRESSION: Right nodule 1 is smaller or but now meets criteria for annual
follow-up. It has changed in appearance and is now a TR4 lesion.

Right nodule 2 is stable and continues to meet criteria for fine
needle aspiration biopsy.

The above is in keeping with the ACR TI-RADS recommendations - [HOSPITAL] 3753;[DATE].

## 2018-07-20 ENCOUNTER — Other Ambulatory Visit: Payer: Self-pay | Admitting: Family Medicine

## 2018-07-29 ENCOUNTER — Ambulatory Visit: Payer: BLUE CROSS/BLUE SHIELD | Admitting: Family Medicine

## 2018-07-29 ENCOUNTER — Encounter: Payer: Self-pay | Admitting: Family Medicine

## 2018-07-29 VITALS — BP 118/76 | HR 83 | Temp 97.7°F | Ht 64.5 in | Wt 171.3 lb

## 2018-07-29 DIAGNOSIS — E538 Deficiency of other specified B group vitamins: Secondary | ICD-10-CM | POA: Diagnosis not present

## 2018-07-29 DIAGNOSIS — Z23 Encounter for immunization: Secondary | ICD-10-CM

## 2018-07-29 DIAGNOSIS — F339 Major depressive disorder, recurrent, unspecified: Secondary | ICD-10-CM | POA: Diagnosis not present

## 2018-07-29 DIAGNOSIS — E785 Hyperlipidemia, unspecified: Secondary | ICD-10-CM

## 2018-07-29 DIAGNOSIS — Z85820 Personal history of malignant melanoma of skin: Secondary | ICD-10-CM

## 2018-07-29 DIAGNOSIS — E559 Vitamin D deficiency, unspecified: Secondary | ICD-10-CM | POA: Diagnosis not present

## 2018-07-29 DIAGNOSIS — Z8639 Personal history of other endocrine, nutritional and metabolic disease: Secondary | ICD-10-CM

## 2018-07-29 DIAGNOSIS — K219 Gastro-esophageal reflux disease without esophagitis: Secondary | ICD-10-CM

## 2018-07-29 MED ORDER — VALACYCLOVIR HCL 500 MG PO TABS: ORAL_TABLET | ORAL | 1 refills | Status: DC

## 2018-07-29 NOTE — Addendum Note (Signed)
Addended by: Johnella Moloney on: 07/29/2018 01:32 PM   Modules accepted: Orders

## 2018-07-29 NOTE — Progress Notes (Signed)
HPI:  Using dictation device. Unfortunately this device frequently misinterprets words/phrases.  Ashley Allen is a pleasant 47 y.o. here for follow up. Chronic medical problems summarized below were reviewed for changes and stability and were updated as needed below. These issues and their treatment remain stable for the most part. Occ GERD symptoms with dietary indiscretion. Uses medication PRN. Reports mood is good. No reporteded CP, SOB, DOE, treatment intolerance or new symptoms. Due for Mammo - she reports scheduled, flu vaccine - wants today, lower b12 dose, phq9 - good  CPE 12/2017  Depression and anxiety: -started effexor 12/07/2015 -doing well now, wishes to continue effexor -did counseling  BMI 28: -plans to start eating better and increase exercise  GERD: -uses prilosec 40mg  prn -hx ulcer, treated -has occ reflux if eats the wrong thing  Thyroid nodules/Hx hypothyroidism: - sees endo, Dr. Elvera Lennox  FH colon cancer: -pat aunt 70 yo colon ca -maternal aunt 64s yo gastric ca -Referred to GI in 2018 by her gynecologist, but her insurance would not cover so she opted not to do per her report -denies bleeding, bowel changes   Hx melanoma: -sees dermatologist for regular follow up and management  ROS: See pertinent positives and negatives per HPI.  Past Medical History:  Diagnosis Date  . Anxiety   . Chicken pox   . Depression   . GERD (gastroesophageal reflux disease)   . Hx of melanoma of skin 06/20/2017   Sees dermatologist  . Thyroid nodule    benign  . Ulcer   . UTI (urinary tract infection)     Past Surgical History:  Procedure Laterality Date  . BREAST ENHANCEMENT SURGERY  2007  . NASAL SINUS SURGERY  ?2010    Family History  Problem Relation Age of Onset  . Alcohol abuse Father   . Arthritis Mother   . Arthritis Father   . Uterine cancer Other   . Colon cancer Other   . Hyperlipidemia Mother   . Heart disease Father   . Hypertension  Mother   . Hypertension Father   . Sudden death Paternal Grandmother   . Cancer Other        Aunts  . Emphysema Father        heavy smoker    SOCIAL HX: see hpi   Current Outpatient Medications:  .  fluticasone (FLONASE) 50 MCG/ACT nasal spray, USE 1 SPRAY INTO EACH NOSTRIL TWICE DAILY, Disp: 16 g, Rfl: 3 .  levonorgestrel (MIRENA) 20 MCG/24HR IUD, 1 each by Intrauterine route once., Disp: , Rfl:  .  NON FORMULARY, Inflavonoid Intensive Care, Disp: , Rfl:  .  omeprazole (PRILOSEC) 20 MG capsule, TAKE 1 CAPSULE (20 MG TOTAL) BY MOUTH DAILY., Disp: 30 capsule, Rfl: 5 .  valACYclovir (VALTREX) 500 MG tablet, 500mg  bid x 3 days at onset symptoms., Disp: 30 tablet, Rfl: 1 .  venlafaxine XR (EFFEXOR-XR) 75 MG 24 hr capsule, TAKE 1 CAPSULE BY MOUTH DAILY WITH BREAKFAST, Disp: 30 capsule, Rfl: 5  EXAM:  Vitals:   07/29/18 1305  BP: 118/76  Pulse: 83  Temp: 97.7 F (36.5 C)    Body mass index is 28.95 kg/m.  GENERAL: vitals reviewed and listed above, alert, oriented, appears well hydrated and in no acute distress  HEENT: atraumatic, conjunttiva clear, no obvious abnormalities on inspection of external nose and ears  NECK: no obvious masses on inspection  LUNGS: clear to auscultation bilaterally, no wheezes, rales or rhonchi, good air movement  CV: HRRR, no peripheral  edema  MS: moves all extremities without noticeable abnormality  PSYCH: pleasant and cooperative, no obvious depression or anxiety  ASSESSMENT AND PLAN:  Discussed the following assessment and plan:  B12 deficiency  Vitamin D deficiency  Dyslipidemia  Depression, recurrent (HCC)  Hx of melanoma of skin  H/O: hypothyroidism  Gastroesophageal reflux disease, esophagitis presence not specified  -uses valtrex prn for herpes - refills per request -decreased dosing b12 and plan to recheck levels -healthy lifestyle advised -BP mildly up - better on recheck - opted for lifestyle changes and  monitor -flu shot today -she reports scheduled for mammog -mood good - cont treatment -Patient advised to return or notify a doctor immediately if symptoms worsen or persist or new concerns arise.  Patient Instructions  BEFORE YOU LEAVE: -flu shot -follow up: CPE in April  Decrease B12 dose to daily   Get mammogram   We recommend the following healthy lifestyle for LIFE: 1) Small portions. But, make sure to get regular (at least 3 per day), healthy meals and small healthy snacks if needed.  2) Eat a healthy clean diet.   TRY TO EAT: -at least 5-7 servings of low sugar, colorful, and nutrient rich vegetables per day (not corn, potatoes or bananas.) -berries are the best choice if you wish to eat fruit (only eat small amounts if trying to reduce weight)  -lean meets (fish, white meat of chicken or [place]) -vegan proteins for some meals - beans or tofu, whole grains, nuts and seeds -Replace bad fats with good fats - good fats include: fish, nuts and seeds, canola oil, olive oil -small amounts of low fat or non fat dairy -small amounts of100 % whole grains - check the lables -drink plenty of water  AVOID: -SUGAR, sweets, anything with added sugar, corn syrup or sweeteners - must read labels as even foods advertised as "healthy" often are loaded with sugar -if you must have a sweetener, small amounts of stevia may be best -sweetened beverages and artificially sweetened beverages -simple starches (rice, bread, potatoes, pasta, chips, etc - small amounts of 100% whole grains are ok) -red meat, pork, butter -fried foods, fast food, processed food, excessive dairy, eggs and coconut.  3)Get at least 150 minutes of sweaty aerobic exercise per week.  4)Reduce stress - consider counseling, meditation and relaxation to balance other aspects of your life.       Terressa Koyanagi, DO

## 2018-07-29 NOTE — Patient Instructions (Signed)
BEFORE YOU LEAVE: -flu shot -follow up: CPE in April  Decrease B12 dose to daily   Get mammogram   We recommend the following healthy lifestyle for LIFE: 1) Small portions. But, make sure to get regular (at least 3 per day), healthy meals and small healthy snacks if needed.  2) Eat a healthy clean diet.   TRY TO EAT: -at least 5-7 servings of low sugar, colorful, and nutrient rich vegetables per day (not corn, potatoes or bananas.) -berries are the best choice if you wish to eat fruit (only eat small amounts if trying to reduce weight)  -lean meets (fish, white meat of chicken or [place]) -vegan proteins for some meals - beans or tofu, whole grains, nuts and seeds -Replace bad fats with good fats - good fats include: fish, nuts and seeds, canola oil, olive oil -small amounts of low fat or non fat dairy -small amounts of100 % whole grains - check the lables -drink plenty of water  AVOID: -SUGAR, sweets, anything with added sugar, corn syrup or sweeteners - must read labels as even foods advertised as "healthy" often are loaded with sugar -if you must have a sweetener, small amounts of stevia may be best -sweetened beverages and artificially sweetened beverages -simple starches (rice, bread, potatoes, pasta, chips, etc - small amounts of 100% whole grains are ok) -red meat, pork, butter -fried foods, fast food, processed food, excessive dairy, eggs and coconut.  3)Get at least 150 minutes of sweaty aerobic exercise per week.  4)Reduce stress - consider counseling, meditation and relaxation to balance other aspects of your life.

## 2018-08-07 DIAGNOSIS — Z01419 Encounter for gynecological examination (general) (routine) without abnormal findings: Secondary | ICD-10-CM | POA: Diagnosis not present

## 2018-08-07 DIAGNOSIS — Z1231 Encounter for screening mammogram for malignant neoplasm of breast: Secondary | ICD-10-CM | POA: Diagnosis not present

## 2018-08-07 DIAGNOSIS — Z1151 Encounter for screening for human papillomavirus (HPV): Secondary | ICD-10-CM | POA: Diagnosis not present

## 2018-08-07 DIAGNOSIS — Z30430 Encounter for insertion of intrauterine contraceptive device: Secondary | ICD-10-CM | POA: Diagnosis not present

## 2018-08-07 DIAGNOSIS — Z6829 Body mass index (BMI) 29.0-29.9, adult: Secondary | ICD-10-CM | POA: Diagnosis not present

## 2018-08-07 LAB — HM MAMMOGRAPHY

## 2018-08-11 ENCOUNTER — Other Ambulatory Visit: Payer: Self-pay | Admitting: Family Medicine

## 2018-08-14 ENCOUNTER — Other Ambulatory Visit: Payer: Self-pay | Admitting: Family Medicine

## 2018-09-10 ENCOUNTER — Encounter: Payer: Self-pay | Admitting: Family Medicine

## 2018-09-19 DIAGNOSIS — R509 Fever, unspecified: Secondary | ICD-10-CM | POA: Diagnosis not present

## 2018-09-19 DIAGNOSIS — J029 Acute pharyngitis, unspecified: Secondary | ICD-10-CM | POA: Diagnosis not present

## 2018-09-19 DIAGNOSIS — J209 Acute bronchitis, unspecified: Secondary | ICD-10-CM | POA: Diagnosis not present

## 2018-09-25 DIAGNOSIS — K5904 Chronic idiopathic constipation: Secondary | ICD-10-CM | POA: Diagnosis not present

## 2018-09-25 DIAGNOSIS — Z1211 Encounter for screening for malignant neoplasm of colon: Secondary | ICD-10-CM | POA: Diagnosis not present

## 2018-09-25 DIAGNOSIS — Z800 Family history of malignant neoplasm of digestive organs: Secondary | ICD-10-CM | POA: Diagnosis not present

## 2018-09-25 DIAGNOSIS — K219 Gastro-esophageal reflux disease without esophagitis: Secondary | ICD-10-CM | POA: Diagnosis not present

## 2018-10-17 ENCOUNTER — Encounter: Payer: Self-pay | Admitting: Family Medicine

## 2018-10-17 DIAGNOSIS — D120 Benign neoplasm of cecum: Secondary | ICD-10-CM | POA: Diagnosis not present

## 2018-10-17 DIAGNOSIS — K635 Polyp of colon: Secondary | ICD-10-CM | POA: Diagnosis not present

## 2018-10-17 DIAGNOSIS — D122 Benign neoplasm of ascending colon: Secondary | ICD-10-CM | POA: Diagnosis not present

## 2018-10-17 DIAGNOSIS — Z800 Family history of malignant neoplasm of digestive organs: Secondary | ICD-10-CM | POA: Diagnosis not present

## 2018-10-17 DIAGNOSIS — Z1211 Encounter for screening for malignant neoplasm of colon: Secondary | ICD-10-CM | POA: Diagnosis not present

## 2018-10-17 DIAGNOSIS — D123 Benign neoplasm of transverse colon: Secondary | ICD-10-CM | POA: Diagnosis not present

## 2018-10-17 LAB — HM COLONOSCOPY

## 2018-11-11 ENCOUNTER — Other Ambulatory Visit: Payer: Self-pay | Admitting: Family Medicine

## 2018-11-19 DIAGNOSIS — J209 Acute bronchitis, unspecified: Secondary | ICD-10-CM | POA: Diagnosis not present

## 2018-11-19 DIAGNOSIS — R51 Headache: Secondary | ICD-10-CM | POA: Diagnosis not present

## 2018-11-19 DIAGNOSIS — J019 Acute sinusitis, unspecified: Secondary | ICD-10-CM | POA: Diagnosis not present

## 2018-11-19 DIAGNOSIS — J09X2 Influenza due to identified novel influenza A virus with other respiratory manifestations: Secondary | ICD-10-CM | POA: Diagnosis not present

## 2018-11-27 ENCOUNTER — Encounter: Payer: Self-pay | Admitting: Family Medicine

## 2018-12-29 ENCOUNTER — Encounter: Payer: BLUE CROSS/BLUE SHIELD | Admitting: Family Medicine

## 2019-02-25 ENCOUNTER — Encounter: Payer: Self-pay | Admitting: Family Medicine

## 2019-02-25 ENCOUNTER — Ambulatory Visit (INDEPENDENT_AMBULATORY_CARE_PROVIDER_SITE_OTHER): Payer: BC Managed Care – PPO | Admitting: Family Medicine

## 2019-02-25 ENCOUNTER — Other Ambulatory Visit: Payer: Self-pay

## 2019-02-25 DIAGNOSIS — G4733 Obstructive sleep apnea (adult) (pediatric): Secondary | ICD-10-CM

## 2019-02-25 DIAGNOSIS — R7989 Other specified abnormal findings of blood chemistry: Secondary | ICD-10-CM | POA: Diagnosis not present

## 2019-02-25 DIAGNOSIS — K219 Gastro-esophageal reflux disease without esophagitis: Secondary | ICD-10-CM | POA: Diagnosis not present

## 2019-02-25 DIAGNOSIS — Z8639 Personal history of other endocrine, nutritional and metabolic disease: Secondary | ICD-10-CM

## 2019-02-25 DIAGNOSIS — Z131 Encounter for screening for diabetes mellitus: Secondary | ICD-10-CM

## 2019-02-25 DIAGNOSIS — E785 Hyperlipidemia, unspecified: Secondary | ICD-10-CM

## 2019-02-25 DIAGNOSIS — Z85820 Personal history of malignant melanoma of skin: Secondary | ICD-10-CM

## 2019-02-25 DIAGNOSIS — N951 Menopausal and female climacteric states: Secondary | ICD-10-CM

## 2019-02-25 DIAGNOSIS — E538 Deficiency of other specified B group vitamins: Secondary | ICD-10-CM

## 2019-02-25 NOTE — Progress Notes (Signed)
Virtual Visit via Video Note  I connected with Ashley Allen  on 02/25/19 at  1:00 PM EDT by a video enabled telemedicine application and verified that I am speaking with the correct person using two identifiers.  Location patient: home Location provider:work office Persons participating in the virtual visit: patient, provider  I discussed the limitations of evaluation and management by telemedicine and the availability of in person appointments. The patient expressed understanding and agreed to proceed.   Ashley Allen DOB: 02/24/1971 Encounter date: 02/25/2019  This is a 48 y.o. female who presents to establish care. Chief Complaint  Patient presents with  . Establish Care    History of present illness: Thinks going through menopause. Breast tenderness, has been thinking she was peri-menopausal for a long time. Some hot flashes, sweats; temp fluctuates a lot. Spotting a couple of times a year with mirena.   OSA: has snored forever. Last sleep study was 10 years ago. Boyfriend states sometimes it is like she stops breathing. Couldn't tolerate old face mask; would take it off in sleep.   GERD: omeprazole 20mg  daily - just using as needed (when she eats food trigger)  Sees gyn: has mirena; had pap in November which was normal. IUD changed that day.   Depression/anxiety:effexor 75mg  daily; all of unknowns is stressful; worry about COVID - worried about family members getting sick; worried about losing job. Feeling better now than she was at beginning.   Hypothyroid history/thyroid nodules: following w endocrinology; due for repeat US this summer. Had been on synthroid and then taken off. Was supposed to see endocrinology in June but this was rescheduled.   Low vitamin d: taking supplement  Low vitamin b12: taking supplement.   Hx melanoma skin: arm and 3 other places with other skin precancers. Follows with derm; (Dr. [place])  HL: has been working on weight loss - fasting, exercising.  Has lost 10 lbs. Hoping that this will help with both lipids/sugar.   Allergies: flonase, zyrtec  Had colonoscopy in Jan - multiple polyps noted. Repeat suggested in 1-3 years.  Past Medical History:  Diagnosis Date  . Anxiety   . Chicken pox   . Depression   . GERD (gastroesophageal reflux disease)   . Hx of melanoma of skin 06/20/2017   Sees dermatologist  . Thyroid nodule    benign  . Ulcer   . UTI (urinary tract infection)    Past Surgical History:  Procedure Laterality Date  . BREAST ENHANCEMENT SURGERY  2007  . NASAL SINUS SURGERY  ?2010   Allergies  Allergen Reactions  . Polysporin [Bacitracin-Polymyxin B]   . Sulfonamide Derivatives     REACTION: Itching   No outpatient medications have been marked as taking for the 02/25/19 encounter (Office Visit) with Wynn Banker, MD.   Social History   Tobacco Use  . Smoking status: Former Smoker    Years: 3.00    Types: Cigarettes  . Smokeless tobacco: Never Used  . Tobacco comment: previous social smoker   Substance Use Topics  . Alcohol use: Yes   Family History  Problem Relation Age of Onset  . Alcohol abuse Father   . Arthritis Father   . Heart disease Father   . Hypertension Father   . Emphysema Father        heavy smoker  . Arthritis Mother   . Hyperlipidemia Mother   . Hypertension Mother   . Uterine cancer Other   . Colon cancer Other   .  Sudden death Paternal Grandmother   . Cancer Other        Aunts  . Colon cancer Paternal Aunt   . Stomach cancer Maternal Aunt        spread to colon   . Cervical cancer Paternal Aunt      Review of Systems  Constitutional: Positive for fatigue. Negative for chills and fever.  Respiratory: Negative for cough, chest tightness, shortness of breath and wheezing.   Cardiovascular: Negative for chest pain, palpitations and leg swelling.  Endocrine: Positive for cold intolerance and heat intolerance.    Objective:  There were no vitals taken for this  visit.      BP Readings from Last 3 Encounters:  07/29/18 118/76  03/31/18 130/80  12/31/17 110/70   Wt Readings from Last 3 Encounters:  07/29/18 171 lb 4.8 oz (77.7 kg)  03/31/18 172 lb 6.4 oz (78.2 kg)  12/31/17 170 lb 6.4 oz (77.3 kg)    EXAM:  GENERAL: alert, oriented, appears well and in no acute distress  HEENT: atraumatic, conjunctiva clear, no obvious abnormalities on inspection of external nose and ears  NECK: normal movements of the head and neck  LUNGS: on inspection no signs of respiratory distress, breathing rate appears normal, no obvious gross SOB, gasping or wheezing  CV: no obvious cyanosis  MS: moves all visible extremities without noticeable abnormality  PSYCH/NEURO: pleasant and cooperative, no obvious depression or anxiety, speech and thought processing grossly intact  SKIN: no obvious abnormality face/neck.   Assessment/Plan  1. OSA (obstructive sleep apnea) - Ambulatory referral to Sleep Studies  2. Gastroesophageal reflux disease, esophagitis presence not specified Cont current medication.  3. H/O: hypothyroidism Been stable off medication, but due to increase in hot flashes as well as fatigue, will recheck for stability. - TSH; Future - T4, free; Future - T3, free; Future  4. Low vitamin D level  - VITAMIN D 25 Hydroxy (Vit-D Deficiency, Fractures); Future  5. Low serum vitamin B12  - Vitamin B12; Future  6. Hx of melanoma of skin Follows with dermatology.  7. Screening for diabetes mellitus - Hemoglobin A1c; Future  8. Menopausal syndrome (hot flashes) - CBC with Differential/Platelet; Future - Follicle Stimulating Hormone; Future - Prolactin; Future - HCG, Qualitative; Future  9. Dyslipidemia - Comprehensive metabolic panel; Future - Lipid panel; Future  Return for bloodwork.    I discussed the assessment and treatment plan with the patient. The patient was provided an opportunity to ask questions and all were  answered. The patient agreed with the plan and demonstrated an understanding of the instructions.   The patient was advised to call back or seek an in-person evaluation if the symptoms worsen or if the condition fails to improve as anticipated.  I provided 32 minutes of non-face-to-face time during this encounter.   Theodis ShoveJunell Zakk Borgen, MD

## 2019-02-26 ENCOUNTER — Telehealth: Payer: Self-pay | Admitting: *Deleted

## 2019-02-26 NOTE — Telephone Encounter (Signed)
Unable to leave a message due to voicemail being full. 

## 2019-02-26 NOTE — Telephone Encounter (Signed)
-----   Message from Wynn Banker, MD sent at 02/25/2019 11:56 PM EDT ----- Please call to set up bloodwork appointment for her. Also let her know that I didn't find much helpful regarding breast sensitivity - but this can be early stage of menopause sx. I did add on some menopausal bloodwork so we can see how this looks and then she can f/u with gyn. Hormonal replacement doesn't always help with the tenderness.

## 2019-03-16 ENCOUNTER — Encounter: Payer: Self-pay | Admitting: Family Medicine

## 2019-03-17 NOTE — Telephone Encounter (Signed)
See My chart message

## 2019-03-18 ENCOUNTER — Encounter: Payer: Self-pay | Admitting: Family Medicine

## 2019-03-18 NOTE — Telephone Encounter (Signed)
I called the pt and scheduled an appt for tomorrow at 11:30am with Dr Jerilee Hoh.

## 2019-03-19 ENCOUNTER — Encounter: Payer: Self-pay | Admitting: Internal Medicine

## 2019-03-19 ENCOUNTER — Other Ambulatory Visit: Payer: Self-pay

## 2019-03-19 ENCOUNTER — Other Ambulatory Visit: Payer: Self-pay | Admitting: Internal Medicine

## 2019-03-19 ENCOUNTER — Ambulatory Visit (INDEPENDENT_AMBULATORY_CARE_PROVIDER_SITE_OTHER): Payer: BC Managed Care – PPO | Admitting: Internal Medicine

## 2019-03-19 ENCOUNTER — Other Ambulatory Visit: Payer: BC Managed Care – PPO

## 2019-03-19 VITALS — BP 110/70 | HR 84 | Temp 98.7°F | Wt 161.2 lb

## 2019-03-19 VITALS — BP 116/82 | HR 91 | Temp 98.2°F | Ht 65.0 in | Wt 160.0 lb

## 2019-03-19 DIAGNOSIS — Z131 Encounter for screening for diabetes mellitus: Secondary | ICD-10-CM | POA: Diagnosis not present

## 2019-03-19 DIAGNOSIS — G4733 Obstructive sleep apnea (adult) (pediatric): Secondary | ICD-10-CM

## 2019-03-19 DIAGNOSIS — E538 Deficiency of other specified B group vitamins: Secondary | ICD-10-CM | POA: Diagnosis not present

## 2019-03-19 DIAGNOSIS — E785 Hyperlipidemia, unspecified: Secondary | ICD-10-CM | POA: Diagnosis not present

## 2019-03-19 DIAGNOSIS — Z8639 Personal history of other endocrine, nutritional and metabolic disease: Secondary | ICD-10-CM | POA: Diagnosis not present

## 2019-03-19 DIAGNOSIS — F339 Major depressive disorder, recurrent, unspecified: Secondary | ICD-10-CM | POA: Diagnosis not present

## 2019-03-19 DIAGNOSIS — N644 Mastodynia: Secondary | ICD-10-CM

## 2019-03-19 DIAGNOSIS — N946 Dysmenorrhea, unspecified: Secondary | ICD-10-CM

## 2019-03-19 DIAGNOSIS — R7989 Other specified abnormal findings of blood chemistry: Secondary | ICD-10-CM | POA: Diagnosis not present

## 2019-03-19 DIAGNOSIS — N951 Menopausal and female climacteric states: Secondary | ICD-10-CM | POA: Diagnosis not present

## 2019-03-19 LAB — COMPREHENSIVE METABOLIC PANEL
ALT: 7 U/L (ref 0–35)
AST: 10 U/L (ref 0–37)
Albumin: 3.8 g/dL (ref 3.5–5.2)
Alkaline Phosphatase: 103 U/L (ref 39–117)
BUN: 4 mg/dL — ABNORMAL LOW (ref 6–23)
CO2: 26 mEq/L (ref 19–32)
Calcium: 8.9 mg/dL (ref 8.4–10.5)
Chloride: 102 mEq/L (ref 96–112)
Creatinine, Ser: 0.47 mg/dL (ref 0.40–1.20)
GFR: 142.15 mL/min (ref >60.00)
Glucose, Bld: 90 mg/dL (ref 70–99)
Potassium: 4.6 mEq/L (ref 3.5–5.1)
Sodium: 137 mEq/L (ref 135–145)
Total Bilirubin: 0.4 mg/dL (ref 0.2–1.2)
Total Protein: 7.1 g/dL (ref 6.0–8.3)

## 2019-03-19 LAB — T4, FREE: Free T4: 0.88 ng/dL (ref 0.60–1.60)

## 2019-03-19 LAB — CBC WITH DIFFERENTIAL/PLATELET
Basophils Absolute: 0.1 10*3/uL (ref 0.0–0.1)
Basophils Relative: 0.8 % (ref 0.0–3.0)
Eosinophils Absolute: 0.1 10*3/uL (ref 0.0–0.7)
Eosinophils Relative: 1.4 % (ref 0.0–5.0)
HCT: 41.4 % (ref 36.0–46.0)
Hemoglobin: 13.6 g/dL (ref 12.0–15.0)
Lymphocytes Relative: 27.7 % (ref 12.0–46.0)
Lymphs Abs: 2 10*3/uL (ref 0.7–4.0)
MCHC: 32.8 g/dL (ref 30.0–36.0)
MCV: 84.7 fl (ref 78.0–100.0)
Monocytes Absolute: 0.5 10*3/uL (ref 0.1–1.0)
Monocytes Relative: 6.6 % (ref 3.0–12.0)
Neutro Abs: 4.6 10*3/uL (ref 1.4–7.7)
Neutrophils Relative %: 63.5 % (ref 43.0–77.0)
Platelets: 533 10*3/uL — ABNORMAL HIGH (ref 150.0–400.0)
RBC: 4.89 Mil/uL (ref 3.87–5.11)
RDW: 14.7 % (ref 11.5–15.5)
WBC: 7.2 10*3/uL (ref 4.0–10.5)

## 2019-03-19 LAB — HEMOGLOBIN A1C: Hgb A1c MFr Bld: 5.7 % (ref 4.6–6.5)

## 2019-03-19 LAB — LIPID PANEL
Cholesterol: 150 mg/dL (ref 0–200)
HDL: 42.2 mg/dL (ref >39.00)
LDL Cholesterol: 94 mg/dL (ref 0–99)
NonHDL: 107.47
Total CHOL/HDL Ratio: 4
Triglycerides: 69 mg/dL (ref 0.0–149.0)
VLDL: 13.8 mg/dL (ref 0.0–40.0)

## 2019-03-19 LAB — POCT URINE PREGNANCY: Preg Test, Ur: NEGATIVE

## 2019-03-19 LAB — TSH: TSH: 1.03 u[IU]/mL (ref 0.35–4.50)

## 2019-03-19 LAB — FOLLICLE STIMULATING HORMONE: FSH: 8 m[IU]/mL

## 2019-03-19 LAB — VITAMIN B12: Vitamin B-12: 1374 pg/mL — ABNORMAL HIGH (ref 211–911)

## 2019-03-19 LAB — VITAMIN D 25 HYDROXY (VIT D DEFICIENCY, FRACTURES): VITD: 48.47 ng/mL (ref 30.00–100.00)

## 2019-03-19 LAB — T3, FREE: T3, Free: 3.4 pg/mL (ref 2.3–4.2)

## 2019-03-19 NOTE — Progress Notes (Signed)
03/19/2019- [age] yoF former smoker for sleep evaluation. referred by Dr. Hassan RowanKoberlein (PCP) for OSA, pt reports snoring a lot and disruptive breathing during sleep; [facility]  NPSG 10/02/09- AHI 6.6/ hr, desaturation to 88%, body weight 146 lbs Medical problem list includes Depression, GERD, Hx Hypothyroid Had tried CPAP with Dr Shelle Ironlance and had talked with her orthodontist at that time about oral appliance. She doesn't remember that. Wears retainer at night.  Body weight today 160 lbs Epworth score 10 Daytime tiredness. Works as Tax advisersales rep, much driving and states no problem with alertness as long as she is engaged.  ENT+ septoplasty and sinus surgery Dr Annalee GentaShoemaker, 2010. Denies heart/ lung. Expects sinus infections with season/ weather change.  Has used Ventolin during acute bronchitis.  Past Medical History:  Diagnosis Date  . Anxiety   . Chicken pox   . Depression   . GERD (gastroesophageal reflux disease)   . Hx of melanoma of skin 06/20/2017   Sees dermatologist  . Sleep apnea   . Thyroid nodule    benign  . Ulcer   . UTI (urinary tract infection)    Past Surgical History:  Procedure Laterality Date  . BREAST ENHANCEMENT SURGERY  2007  . NASAL SINUS SURGERY  ?2010   Family History  Problem Relation Age of Onset  . Alcohol abuse Father   . Arthritis Father   . Heart disease Father   . Hypertension Father   . Emphysema Father        heavy smoker  . Arthritis Mother   . Hyperlipidemia Mother   . Hypertension Mother   . Uterine cancer Other   . Colon cancer Other   . Sudden death Paternal Grandmother   . Cancer Other        Aunts  . Colon cancer Paternal Aunt   . Stomach cancer Maternal Aunt        spread to colon   . Cervical cancer Paternal Aunt    Social History   Socioeconomic History  . Marital status: Single    Spouse name: Not on file  . Number of children: 0  . Years of education: 1616  . Highest education level: Not on file  Occupational  History  . Occupation: Investment banker, corporateales    Employer: [employer]  Social Needs  . Financial resource strain: Not on file  . Food insecurity    Worry: Not on file    Inability: Not on file  . Transportation needs    Medical: Not on file    Non-medical: Not on file  Tobacco Use  . Smoking status: Former Smoker    Years: 3.00    Types: Cigarettes    Quit date: 09/24/2009    Years since quitting: 9.4  . Smokeless tobacco: Never Used  . Tobacco comment: previous social smoker   Substance and Sexual Activity  . Alcohol use: Yes  . Drug use: No  . Sexual activity: Not on file  Lifestyle  . Physical activity    Days per week: Not on file    Minutes per session: Not on file  . Stress: Not on file  Relationships  . Social Musicianconnections    Talks on phone: Not on file    Gets together: Not on file    Attends religious service: Not on file    Active member of club or organization: Not on file    Attends meetings of clubs or organizations: Not on file  Relationship status: Not on file  . Intimate partner violence    Fear of current or ex partner: Not on file    Emotionally abused: Not on file    Physically abused: Not on file    Forced sexual activity: Not on file  Other Topics Concern  . Not on file  Social History Narrative   Regular exercise-yes   Caffeine Use-yes   ROS-see HPI   + = positive Constitutional:    weight loss, night sweats, fevers, chills, fatigue, lassitude. HEENT:    headaches, difficulty swallowing, tooth/dental problems, sore throat,       sneezing, itching, ear ache, +nasal congestion, post nasal drip, snoring CV:    chest pain, orthopnea, PND, swelling in lower extremities, anasarca,                                  dizziness, palpitations Resp:   shortness of breath with exertion or at rest.                productive cough,   non-productive cough, coughing up of blood.              change in color of mucus.  wheezing.   Skin:    rash or lesions. GI:  +heartburn,  indigestion, abdominal pain, nausea, vomiting, diarrhea,                 change in bowel habits, loss of appetite GU: dysuria, change in color of urine, no urgency or frequency.   flank pain. MS:   joint pain, stiffness, decreased range of motion, back pain. Neuro-     nothing unusual Psych:  change in mood or affect.  depression or +anxiety.   memory loss.  OBJ- Physical Exam General- Alert, Oriented, Affect-appropriate, Distress- none acute Skin- rash-none, lesions- none, excoriation- none Lymphadenopathy- none Head- atraumatic            Eyes- Gross vision intact, PERRLA, conjunctivae and secretions clear            Ears- Hearing, canals-normal            Nose- Clear, no-Septal dev, mucus, polyps, erosion, perforation             Throat- Mallampati III , mucosa clear , drainage- none, tonsils- atrophic Neck- flexible , trachea midline, no stridor , thyroid nl, carotid no bruit Chest - symmetrical excursion , unlabored           Heart/CV- RRR , no murmur , no gallop  , no rub, nl s1 s2                           - JVD- none , edema- none, stasis changes- none, varices- none           Lung- clear to P&A, wheeze- none, cough- none , dullness-none, rub- none           Chest wall-  Abd-  Br/ Gen/ Rectal- Not done, not indicated Extrem- cyanosis- none, clubbing, none, atrophy- none, strength- nl Neuro- grossly intact to observation

## 2019-03-19 NOTE — Patient Instructions (Signed)
Order- schedule unattended home sleep test  Dx OSA  Please call about 2 weeks after your sleep study to see if results and recommendations are ready. If appropriate, we may be able to start treatment before we see you next.

## 2019-03-19 NOTE — Progress Notes (Signed)
Acute Office Visit     CC/Reason for Visit: right breast tenderness and enlargement  HPI: Ashley Allen is a 48 y.o. female who is coming in today for the above mentioned reasons. She had silicone breast implants in [place] in 2007. She had a negative mammogram in 11/19. At the same time she had her IUD replaced. She is currently having a period, she is sexually active. About 3 months ago she started noticing changes in the shape of her right breast. It is now sitting higher, is firmer and she feels she has some "swelling" under her right axilla. There is also some sensitivity, no nipple discharge. No personal or family history of breast cancer. She was afraid to come in sooner to have this evaluated.   Past Medical/Surgical History: Past Medical History:  Diagnosis Date  . Anxiety   . Chicken pox   . Depression   . GERD (gastroesophageal reflux disease)   . Hx of melanoma of skin 06/20/2017   Sees dermatologist  . Sleep apnea   . Thyroid nodule    benign  . Ulcer   . UTI (urinary tract infection)     Past Surgical History:  Procedure Laterality Date  . BREAST ENHANCEMENT SURGERY  2007  . NASAL SINUS SURGERY  ?2010    Social History:  reports that she quit smoking about 9 years ago. Her smoking use included cigarettes. She quit after 3.00 years of use. She has never used smokeless tobacco. She reports current alcohol use. She reports that she does not use drugs.  Allergies: Allergies  Allergen Reactions  . Polysporin [Bacitracin-Polymyxin B]   . Sulfonamide Derivatives     REACTION: Itching    Family History:  Family History  Problem Relation Age of Onset  . Alcohol abuse Father   . Arthritis Father   . Heart disease Father   . Hypertension Father   . Emphysema Father        heavy smoker  . Arthritis Mother   . Hyperlipidemia Mother   . Hypertension Mother   . Uterine cancer Other   . Colon cancer Other   . Sudden death Paternal Grandmother   .  Cancer Other        Aunts  . Colon cancer Paternal Aunt   . Stomach cancer Maternal Aunt        spread to colon   . Cervical cancer Paternal Aunt      Current Outpatient Medications:  .  Cholecalciferol (VITAMIN D3) 25 MCG (1000 UT) CAPS, Take by mouth., Disp: , Rfl:  .  fluticasone (FLONASE) 50 MCG/ACT nasal spray, USE 1 SPRAY INTO EACH NOSTRIL TWICE DAILY, Disp: 16 g, Rfl: 3 .  ipratropium (ATROVENT) 0.06 % nasal spray, 2 sprays by Each Nare route Three (3) times a day., Disp: , Rfl:  .  levonorgestrel (MIRENA) 20 MCG/24HR IUD, 1 each by Intrauterine route once., Disp: , Rfl:  .  omeprazole (PRILOSEC) 20 MG capsule, TAKE 1 CAPSULE (20 MG TOTAL) BY MOUTH DAILY. (Patient taking differently: Take 20 mg by mouth daily as needed. ), Disp: 30 capsule, Rfl: 5 .  valACYclovir (VALTREX) 500 MG tablet, 500mg  bid x 3 days at onset symptoms., Disp: 30 tablet, Rfl: 1 .  venlafaxine XR (EFFEXOR-XR) 75 MG 24 hr capsule, TAKE 1 CAPSULE BY MOUTH DAILY WITH BREAKFAST, Disp: 90 capsule, Rfl: 1 .  VENTOLIN HFA 108 (90 Base) MCG/ACT inhaler, INHALE 2 PUFFS BY MOUTH EVERY 4 HOURS, Disp: ,  Rfl:  .  NON FORMULARY, Inflavonoid Intensive Care, Disp: , Rfl:   Review of Systems:  Constitutional: Denies fever, chills, diaphoresis, appetite change and fatigue.  HEENT: Denies photophobia, eye pain, redness, hearing loss, ear pain, congestion, sore throat, rhinorrhea, sneezing, mouth sores, trouble swallowing, neck pain, neck stiffness and tinnitus.   Respiratory: Denies SOB, DOE, cough, chest tightness,  and wheezing.   Cardiovascular: Denies chest pain, palpitations and leg swelling.  Gastrointestinal: Denies nausea, vomiting, abdominal pain, diarrhea, constipation, blood in stool and abdominal distention.  Genitourinary: Denies dysuria, urgency, frequency, hematuria, flank pain and difficulty urinating.  Endocrine: Denies: hot or cold intolerance, sweats, changes in hair or nails, polyuria, polydipsia.  Musculoskeletal: Denies myalgias, back pain, joint swelling, arthralgias and gait problem.  Skin: Denies pallor, rash and wound.  Neurological: Denies dizziness, seizures, syncope, weakness, light-headedness, numbness and headaches.  Hematological: Denies adenopathy. Easy bruising, personal or family bleeding history  Psychiatric/Behavioral: Denies suicidal ideation, mood changes, confusion, nervousness, sleep disturbance and agitation    Physical Exam: Vitals:   03/19/19 1122  BP: 110/70  Pulse: 84  Temp: 98.7 F (37.1 C)  TempSrc: Oral  SpO2: 97%  Weight: 161 lb 3.2 oz (73.1 kg)    Body mass index is 26.83 kg/m.   Constitutional: NAD, calm, comfortable Breast: right breast is harder to touch and is sitting higher on her chest, also some mild edema into the right axilla, no erythema or significant pain to touch. Musculoskeletal: no clubbing / cyanosis. No joint deformity upper and lower extremities. Good ROM, no contractures. Normal muscle tone.  Skin: no rashes, lesions, ulcers. No induration Neurologic: CN 2-12 grossly intact. Sensation intact, DTR normal. Strength 5/5 in all 4.  Psychiatric: Normal judgment and insight. Alert and oriented x 3. Normal mood.    Impression and Plan:  Breast pain, right  -as a precaution we have done a u preg that is negative. -Does not appear to be an infection. -I am concerned for movement/damage of the implant. She does not recall trauma or injury to that area. -Will send referral to plastics. -Will also request a diagnostic mammogram to be extra cautious, even tho she had a negative mammo in November.     Patient Instructions  -Nice meeting you today!!  -Mammogram right breast has been requested.  -Will send out referral to plastic surgery. Please call us back if you have not heard from them in 2 weeks.      Chaya JanEstela Hernandez Acosta, MD [facility]

## 2019-03-19 NOTE — Patient Instructions (Signed)
-  Nice meeting you today!!  -Mammogram right breast has been requested.  -Will send out referral to plastic surgery. Please call us back if you have not heard from them in 2 weeks.

## 2019-03-20 ENCOUNTER — Encounter: Payer: Self-pay | Admitting: Internal Medicine

## 2019-03-20 LAB — PROLACTIN: Prolactin: 4.7 ng/mL

## 2019-03-20 LAB — HCG, SERUM, QUALITATIVE: Preg, Serum: NEGATIVE

## 2019-03-20 NOTE — Assessment & Plan Note (Signed)
Managed elsewhere 

## 2019-03-20 NOTE — Assessment & Plan Note (Signed)
Has gained weight since original study. Needs updated documentation. We reviewed basics of OSA, testing, responsibility to drive safely. She is interested in alternatives to CPAP. She has an Clinical biochemist with unknown interest in oral appliances. There may need to be some discussion about her current retainer.  Plan- sleep study

## 2019-03-23 ENCOUNTER — Ambulatory Visit
Admission: RE | Admit: 2019-03-23 | Discharge: 2019-03-23 | Disposition: A | Discharge status: Home / Self Care | Payer: BC Managed Care – PPO | Source: Ambulatory Visit | Attending: Internal Medicine | Admitting: Internal Medicine

## 2019-03-23 ENCOUNTER — Other Ambulatory Visit: Payer: Self-pay | Admitting: Internal Medicine

## 2019-03-23 ENCOUNTER — Other Ambulatory Visit: Payer: Self-pay

## 2019-03-23 DIAGNOSIS — R922 Inconclusive mammogram: Secondary | ICD-10-CM | POA: Diagnosis not present

## 2019-03-23 DIAGNOSIS — N644 Mastodynia: Secondary | ICD-10-CM

## 2019-04-04 ENCOUNTER — Other Ambulatory Visit: Payer: Self-pay | Admitting: Family Medicine

## 2019-04-06 ENCOUNTER — Telehealth: Payer: Self-pay | Admitting: Plastic Surgery

## 2019-04-06 ENCOUNTER — Other Ambulatory Visit: Payer: Self-pay

## 2019-04-06 ENCOUNTER — Ambulatory Visit: Payer: BC Managed Care – PPO

## 2019-04-06 DIAGNOSIS — G4733 Obstructive sleep apnea (adult) (pediatric): Secondary | ICD-10-CM | POA: Diagnosis not present

## 2019-04-06 NOTE — Telephone Encounter (Signed)

## 2019-04-07 ENCOUNTER — Encounter: Payer: Self-pay | Admitting: Family Medicine

## 2019-04-07 ENCOUNTER — Ambulatory Visit: Payer: BC Managed Care – PPO | Admitting: Plastic Surgery

## 2019-04-07 ENCOUNTER — Encounter: Payer: Self-pay | Admitting: Plastic Surgery

## 2019-04-07 DIAGNOSIS — T8544XA Capsular contracture of breast implant, initial encounter: Secondary | ICD-10-CM | POA: Diagnosis not present

## 2019-04-07 DIAGNOSIS — G4733 Obstructive sleep apnea (adult) (pediatric): Secondary | ICD-10-CM | POA: Diagnosis not present

## 2019-04-07 DIAGNOSIS — N6481 Ptosis of breast: Secondary | ICD-10-CM

## 2019-04-07 NOTE — Progress Notes (Signed)
Patient ID: Ashley Allen, female    DOB: 03-27-1971, 48 y.o.   MRN: [id number]   Chief Complaint  Patient presents with  . Breast Problem    The patient is a 48 year old female here for evaluation of her implants in her breast.  In 2007 the patient went to [place] and had gel implants placed.  She thinks they are 375 cc implants.  She is going to call us when she looks at her card at home.  Over the past year she has noticed pain and swelling of both breasts.  The right breast is very firm to palpation more so than the left.  She had mammogram and ultrasound that were concerning as there was a lot of density and they were not able to see the breast tissue very well.  They have recommended an MRI.  The patient is going to arrange this with her OB/GYN.  The last mammogram was November 2019.  The patient is 5 feet 5 inches tall.  She is 160 pounds.  Prior to the implant placement she was a 36 A.  After the implant placement she was a 36 C.  She would like to be around the same size.  I recommended a slight bit smaller and she is in agreement.  I do not feel any lumps on exam but the firmness is definitely concerning.  They are also tender to touch.  She has ptosis on bilateral breasts.  The incision was from 3:00 to 9 o'clock position of the inferior areola.   Review of Systems  Constitutional: Negative for activity change and appetite change.  Eyes: Negative.   Respiratory: Negative for chest tightness and shortness of breath.   Cardiovascular: Negative for leg swelling.  Gastrointestinal: Negative for abdominal pain.  Endocrine: Negative.   Genitourinary: Negative.   Musculoskeletal: Positive for back pain.  Skin: Negative for color change and wound.  Psychiatric/Behavioral: Negative for behavioral problems.    Past Medical History:  Diagnosis Date  . Anxiety   . Chicken pox   . Depression   . GERD (gastroesophageal reflux disease)   . Hx of melanoma of skin 06/20/2017   Sees  dermatologist  . Sleep apnea   . Thyroid nodule    benign  . Ulcer   . UTI (urinary tract infection)     Past Surgical History:  Procedure Laterality Date  . BREAST ENHANCEMENT SURGERY  2007  . NASAL SINUS SURGERY  ?2010      Current Outpatient Medications:  .  Cholecalciferol (VITAMIN D3) 25 MCG (1000 UT) CAPS, Take by mouth., Disp: , Rfl:  .  fluticasone (FLONASE) 50 MCG/ACT nasal spray, USE 1 SPRAY INTO EACH NOSTRIL TWICE DAILY, Disp: 16 g, Rfl: 3 .  ipratropium (ATROVENT) 0.06 % nasal spray, 2 sprays by Each Nare route Three (3) times a day., Disp: , Rfl:  .  levonorgestrel (MIRENA) 20 MCG/24HR IUD, 1 each by Intrauterine route once., Disp: , Rfl:  .  NON FORMULARY, Inflavonoid Intensive Care, Disp: , Rfl:  .  omeprazole (PRILOSEC) 20 MG capsule, TAKE 1 CAPSULE (20 MG TOTAL) BY MOUTH DAILY. (Patient taking differently: Take 20 mg by mouth daily as needed. ), Disp: 30 capsule, Rfl: 5 .  valACYclovir (VALTREX) 500 MG tablet, 500mg  bid x 3 days at onset symptoms., Disp: 30 tablet, Rfl: 1 .  venlafaxine XR (EFFEXOR-XR) 75 MG 24 hr capsule, TAKE 1 CAPSULE BY MOUTH DAILY WITH BREAKFAST, Disp: 90 capsule, Rfl: 1 .  VENTOLIN HFA 108 (90 Base) MCG/ACT inhaler, INHALE 2 PUFFS BY MOUTH EVERY 4 HOURS, Disp: , Rfl:    Objective:   Vitals:   04/07/19 1205  BP: (!) 133/92  Pulse: 85  Temp: 98 F (36.7 C)  SpO2: 98%    Physical Exam Vitals signs and nursing note reviewed.  Constitutional:      Appearance: Normal appearance.  HENT:     Head: Normocephalic and atraumatic.  Eyes:     Pupils: Pupils are equal, round, and reactive to light.  Cardiovascular:     Rate and Rhythm: Normal rate.     Pulses: Normal pulses.  Pulmonary:     Effort: Pulmonary effort is normal. No respiratory distress.  Abdominal:     General: Abdomen is flat. There is no distension.  Skin:    General: Skin is warm.  Neurological:     General: No focal deficit present.     Mental Status: She is alert.  Mental status is at baseline.  Psychiatric:        Mood and Affect: Mood normal.        Behavior: Behavior normal.        Thought Content: Thought content normal.        Judgment: Judgment normal.     Assessment & Plan:     ICD-10-CM   1. Breast ptosis  N64.81   2. Capsular contracture of breast implant, initial encounter  T85.44XA     Pictures were obtained of the patient and placed in the chart with the patient's or guardian's permission.  Recommend bilateral breast implant removal with complete capsulectomy.  The patient wants to have implants placed again.  I recommend going a little bit smaller.  She will also need a mastopexy.  I have asked for the results of her MRI once she gets it.  Alena Billslaire S Alexismarie Flaim, DO

## 2019-04-08 ENCOUNTER — Other Ambulatory Visit: Payer: Self-pay | Admitting: Family Medicine

## 2019-04-08 DIAGNOSIS — T8544XA Capsular contracture of breast implant, initial encounter: Secondary | ICD-10-CM

## 2019-04-08 MED ORDER — OMEPRAZOLE 20 MG PO CPDR: 20.0000 mg | DELAYED_RELEASE_CAPSULE | Freq: Every day | ORAL | 1 refills | Status: DC

## 2019-04-20 ENCOUNTER — Telehealth: Payer: Self-pay | Admitting: Internal Medicine

## 2019-04-20 NOTE — Telephone Encounter (Signed)
Pt had sleep study performed 7/13 and is calling requesting to know the results. Dr. Annamaria Boots, please advise on this for pt. Thanks!

## 2019-04-21 NOTE — Telephone Encounter (Signed)
Home sleep test showed mild-to-moderate obstructive sleep apnea, averaging 14.7 apneas/ hour with drops in blood oxygen level. Best options would be to try again with newer CPAP machine and mask options, or talk to her orthodontist or someone I can refer her to, about a fitted oral appliance.  Weight loss can help, but need to keep it off. We can refer to ENT to discuss surgery options, including the new  Inspire technology for people who have failed CPAP. Does she know what she would like to do, or would she like to keep appointment to come back and discuss again in person?

## 2019-04-21 NOTE — Telephone Encounter (Signed)
Called and spoke with pt letting her know the results of the sleep study. Stated to pt that the best option would be to try cpap again but stated to her that she could also talk with her orthodontist to see if they could fit her for an oral appliance to see if that would help. Also stated to pt that we could refer her to ENT to discuss surgery options and pt did not want to go through with the ENT referral. Pt said she would discuss with her orthodontist to see if they could fit her for an appliance. Pt said if she could not be fit with an appliance that she would call us back to discuss other options. Nothing further needed.

## 2019-04-21 NOTE — Telephone Encounter (Signed)
Patient is returning phone call.  Patient phone number is [phone].

## 2019-04-21 NOTE — Telephone Encounter (Signed)
I attempted to call pt with these results and recommendations. At the time of the call, pt did not answer and line went to voicemail. I was unable to leave pt a voice message as her voicemail box was full.   Will try to contact patient again later.

## 2019-04-21 NOTE — Progress Notes (Signed)
Multiple attempts made to contact patient. Her voicemail is full and I am unable to leave a message. According to her insurance policy guidelines, she does not meet the criteria for coverage and insurance is not allowing prior authorization to be submitted for the codes needed, and determination is based on the ability to meet the guidelines. We are mailing a letter for the patient to contact our office so that we can explain this information.

## 2019-04-23 ENCOUNTER — Telehealth: Payer: Self-pay | Admitting: Internal Medicine

## 2019-04-23 DIAGNOSIS — G4733 Obstructive sleep apnea (adult) (pediatric): Secondary | ICD-10-CM

## 2019-04-23 NOTE — Telephone Encounter (Signed)
Yes please- refer to Orthodontics- Dr Ron Parker   Consider oral appliance for OSA

## 2019-04-23 NOTE — Telephone Encounter (Signed)
Spoke with the pt  She has decided she wants to go with oral appliance  Do You want to refer to Dr Ron Parker? Please advise thanks

## 2019-04-23 NOTE — Telephone Encounter (Signed)
Referral made and pt aware 

## 2019-04-24 ENCOUNTER — Encounter: Payer: Self-pay | Admitting: Family Medicine

## 2019-04-26 ENCOUNTER — Inpatient Hospital Stay: Admission: RE | Admit: 2019-04-26 | Payer: BC Managed Care – PPO | Source: Ambulatory Visit

## 2019-04-27 ENCOUNTER — Telehealth: Payer: Self-pay | Admitting: *Deleted

## 2019-04-27 ENCOUNTER — Telehealth: Payer: BC Managed Care – PPO | Admitting: Physician Assistant

## 2019-04-27 ENCOUNTER — Encounter: Payer: Self-pay | Admitting: Physician Assistant

## 2019-04-27 ENCOUNTER — Encounter (INDEPENDENT_AMBULATORY_CARE_PROVIDER_SITE_OTHER): Payer: Self-pay

## 2019-04-27 DIAGNOSIS — R6889 Other general symptoms and signs: Secondary | ICD-10-CM | POA: Diagnosis not present

## 2019-04-27 DIAGNOSIS — Z20822 Contact with and (suspected) exposure to covid-19: Secondary | ICD-10-CM

## 2019-04-27 DIAGNOSIS — R05 Cough: Secondary | ICD-10-CM

## 2019-04-27 DIAGNOSIS — R059 Cough, unspecified: Secondary | ICD-10-CM

## 2019-04-27 MED ORDER — BENZONATATE 100 MG PO CAPS: 100.0000 mg | ORAL_CAPSULE | Freq: Three times a day (TID) | ORAL | 0 refills | Status: DC | PRN

## 2019-04-27 MED ORDER — DOXYCYCLINE HYCLATE 100 MG PO TABS: 100.0000 mg | ORAL_TABLET | Freq: Two times a day (BID) | ORAL | 0 refills | Status: DC

## 2019-04-27 NOTE — Telephone Encounter (Signed)
Pt's MyChart Companion response 04/27/2019 of weakness triggered BPA; pt advised to follow her plan of care as discussed with provider 04/27/2019.

## 2019-04-27 NOTE — Progress Notes (Signed)
E-Visit for Corona Virus Screening   Your current symptoms could be consistent with the coronavirus.  Many health care providers can now test patients at their office but not all are.  [place] has multiple testing sites. For information on our COVID testing locations and hours go to [email or web address]  Please quarantine yourself while awaiting your test results.  We are enrolling you in our MyChart Home Montioring for COVID19 . Daily you will receive a questionnaire within the MyChart website. Our COVID 19 response team willl be monitoriing your responses daily.    COVID-19 is a respiratory illness with symptoms that are similar to the flu. Symptoms are typically mild to moderate, but there have been cases of severe illness and death due to the virus. The following symptoms may appear 2-14 days after exposure: . Fever . Cough . Shortness of breath or difficulty breathing . Chills . Repeated shaking with chills . Muscle pain . Headache . Sore throat . New loss of taste or smell . Fatigue . Congestion or runny nose . Nausea or vomiting . Diarrhea  It is vitally important that if you feel that you have an infection such as this virus or any other virus that you stay home and away from places where you may spread it to others.  You should self-quarantine for 14 days if you have symptoms that could potentially be coronavirus or have been in close contact a with a person diagnosed with COVID-19 within the last 2 weeks. You should avoid contact with people age 65 and older.   You should wear a mask or cloth face covering over your nose and mouth if you must be around other people or animals, including pets (even at home). Try to stay at least 6 feet away from other people. This will protect the people around you.  You can use medication such as A prescription cough medication called Tessalon Perles 100 mg. You may take 1-2 capsules every 8 hours as needed for  cough  You may also take acetaminophen (Tylenol) as needed for fever.  I have provided a work note    Reduce your risk of any infection by using the same precautions used for avoiding the common cold or flu:  . Wash your hands often with soap and warm water for at least 20 seconds.  If soap and water are not readily available, use an alcohol-based hand sanitizer with at least 60% alcohol.  . If coughing or sneezing, cover your mouth and nose by coughing or sneezing into the elbow areas of your shirt or coat, into a tissue or into your sleeve (not your hands). . Avoid shaking hands with others and consider head nods or verbal greetings only. . Avoid touching your eyes, nose, or mouth with unwashed hands.  . Avoid close contact with people who are sick. . Avoid places or events with large numbers of people in one location, like concerts or sporting events. . Carefully consider travel plans you have or are making. . If you are planning any travel outside or inside the [place], visit the CDC's Travelers' Health webpage for the latest health notices. . If you have some symptoms but not all symptoms, continue to monitor at home and seek medical attention if your symptoms worsen. . If you are having a medical emergency, call 911.  HOME CARE . Only take medications as instructed by your medical team. . Drink plenty of fluids and get plenty of rest. . A steam or ultrasonic humidifier   can help if you have congestion.   GET HELP RIGHT AWAY IF YOU HAVE EMERGENCY WARNING SIGNS** FOR COVID-19. If you or someone is showing any of these signs seek emergency medical care immediately. Call 911 or proceed to your closest emergency facility if: . You develop worsening high fever. . Trouble breathing . Bluish lips or face . Persistent pain or pressure in the chest . New confusion . Inability to wake or stay awake . You cough up blood. . Your symptoms become more severe  **This list is not all possible  symptoms. Contact your medical provider for any symptoms that are sever or concerning to you.   MAKE SURE YOU   Understand these instructions.  Will watch your condition.  Will get help right away if you are not doing well or get worse.  Your e-visit answers were reviewed by a board certified advanced clinical practitioner to complete your personal care plan.  Depending on the condition, your plan could have included both over the counter or prescription medications.  If there is a problem please reply once you have received a response from your provider.  Your safety is important to us.  If you have drug allergies check your prescription carefully.    You can use MyChart to ask questions about today's visit, request a non-urgent call back, or ask for a work or school excuse for 24 hours related to this e-Visit. If it has been greater than 24 hours you will need to follow up with your provider, or enter a new e-Visit to address those concerns. You will get an e-mail in the next two days asking about your experience.  I hope that your e-visit has been valuable and will speed your recovery. Thank you for using e-visits.   I spent 5-10 minutes on review and completion of this note- Ernestina Joe PAC  

## 2019-04-27 NOTE — Telephone Encounter (Signed)
Noted. It appears that she is in conversation with provider through e visit today and that concerns are being addressed.

## 2019-04-27 NOTE — Addendum Note (Signed)
Addended by: Waldon Merl on: 04/27/2019 12:50 PM   Modules accepted: Orders

## 2019-04-28 ENCOUNTER — Encounter (INDEPENDENT_AMBULATORY_CARE_PROVIDER_SITE_OTHER): Payer: Self-pay

## 2019-04-29 ENCOUNTER — Encounter (INDEPENDENT_AMBULATORY_CARE_PROVIDER_SITE_OTHER): Payer: Self-pay

## 2019-04-30 ENCOUNTER — Encounter (INDEPENDENT_AMBULATORY_CARE_PROVIDER_SITE_OTHER): Payer: Self-pay

## 2019-05-01 ENCOUNTER — Other Ambulatory Visit: Payer: Self-pay | Admitting: Family Medicine

## 2019-05-01 ENCOUNTER — Encounter (INDEPENDENT_AMBULATORY_CARE_PROVIDER_SITE_OTHER): Payer: Self-pay

## 2019-05-02 ENCOUNTER — Encounter (INDEPENDENT_AMBULATORY_CARE_PROVIDER_SITE_OTHER): Payer: Self-pay

## 2019-05-03 ENCOUNTER — Encounter (INDEPENDENT_AMBULATORY_CARE_PROVIDER_SITE_OTHER): Payer: Self-pay

## 2019-05-04 ENCOUNTER — Encounter: Payer: Self-pay | Admitting: Family Medicine

## 2019-05-04 ENCOUNTER — Encounter (INDEPENDENT_AMBULATORY_CARE_PROVIDER_SITE_OTHER): Payer: Self-pay

## 2019-05-04 DIAGNOSIS — F411 Generalized anxiety disorder: Secondary | ICD-10-CM | POA: Diagnosis not present

## 2019-05-05 ENCOUNTER — Encounter (INDEPENDENT_AMBULATORY_CARE_PROVIDER_SITE_OTHER): Payer: Self-pay

## 2019-05-06 ENCOUNTER — Encounter (INDEPENDENT_AMBULATORY_CARE_PROVIDER_SITE_OTHER): Payer: Self-pay

## 2019-05-08 ENCOUNTER — Encounter (INDEPENDENT_AMBULATORY_CARE_PROVIDER_SITE_OTHER): Payer: Self-pay

## 2019-05-09 ENCOUNTER — Encounter (INDEPENDENT_AMBULATORY_CARE_PROVIDER_SITE_OTHER): Payer: Self-pay

## 2019-05-10 ENCOUNTER — Encounter (INDEPENDENT_AMBULATORY_CARE_PROVIDER_SITE_OTHER): Payer: Self-pay

## 2019-05-11 DIAGNOSIS — F411 Generalized anxiety disorder: Secondary | ICD-10-CM | POA: Diagnosis not present

## 2019-05-13 DIAGNOSIS — S134XXA Sprain of ligaments of cervical spine, initial encounter: Secondary | ICD-10-CM | POA: Diagnosis not present

## 2019-05-13 DIAGNOSIS — F411 Generalized anxiety disorder: Secondary | ICD-10-CM | POA: Diagnosis not present

## 2019-05-13 DIAGNOSIS — S233XXA Sprain of ligaments of thoracic spine, initial encounter: Secondary | ICD-10-CM | POA: Diagnosis not present

## 2019-05-13 DIAGNOSIS — S338XXA Sprain of other parts of lumbar spine and pelvis, initial encounter: Secondary | ICD-10-CM | POA: Diagnosis not present

## 2019-06-02 DIAGNOSIS — S338XXA Sprain of other parts of lumbar spine and pelvis, initial encounter: Secondary | ICD-10-CM | POA: Diagnosis not present

## 2019-06-02 DIAGNOSIS — S233XXA Sprain of ligaments of thoracic spine, initial encounter: Secondary | ICD-10-CM | POA: Diagnosis not present

## 2019-06-02 DIAGNOSIS — S134XXA Sprain of ligaments of cervical spine, initial encounter: Secondary | ICD-10-CM | POA: Diagnosis not present

## 2019-06-12 DIAGNOSIS — F411 Generalized anxiety disorder: Secondary | ICD-10-CM | POA: Diagnosis not present

## 2019-06-15 DIAGNOSIS — Z85828 Personal history of other malignant neoplasm of skin: Secondary | ICD-10-CM | POA: Diagnosis not present

## 2019-06-15 DIAGNOSIS — Z85820 Personal history of malignant melanoma of skin: Secondary | ICD-10-CM | POA: Diagnosis not present

## 2019-06-15 DIAGNOSIS — L700 Acne vulgaris: Secondary | ICD-10-CM | POA: Diagnosis not present

## 2019-06-15 DIAGNOSIS — B078 Other viral warts: Secondary | ICD-10-CM | POA: Diagnosis not present

## 2019-06-15 DIAGNOSIS — D485 Neoplasm of uncertain behavior of skin: Secondary | ICD-10-CM | POA: Diagnosis not present

## 2019-06-15 DIAGNOSIS — D2372 Other benign neoplasm of skin of left lower limb, including hip: Secondary | ICD-10-CM | POA: Diagnosis not present

## 2019-06-15 DIAGNOSIS — L738 Other specified follicular disorders: Secondary | ICD-10-CM | POA: Diagnosis not present

## 2019-06-30 DIAGNOSIS — S134XXA Sprain of ligaments of cervical spine, initial encounter: Secondary | ICD-10-CM | POA: Diagnosis not present

## 2019-06-30 DIAGNOSIS — S233XXA Sprain of ligaments of thoracic spine, initial encounter: Secondary | ICD-10-CM | POA: Diagnosis not present

## 2019-06-30 DIAGNOSIS — S338XXA Sprain of other parts of lumbar spine and pelvis, initial encounter: Secondary | ICD-10-CM | POA: Diagnosis not present

## 2019-07-09 ENCOUNTER — Ambulatory Visit: Payer: BC Managed Care – PPO | Admitting: Internal Medicine

## 2019-07-10 DIAGNOSIS — F411 Generalized anxiety disorder: Secondary | ICD-10-CM | POA: Diagnosis not present

## 2019-08-10 DIAGNOSIS — Z6828 Body mass index (BMI) 28.0-28.9, adult: Secondary | ICD-10-CM | POA: Diagnosis not present

## 2019-08-10 DIAGNOSIS — Z01419 Encounter for gynecological examination (general) (routine) without abnormal findings: Secondary | ICD-10-CM | POA: Diagnosis not present

## 2019-08-17 ENCOUNTER — Other Ambulatory Visit: Payer: Self-pay | Admitting: Internal Medicine

## 2019-08-17 ENCOUNTER — Telehealth: Payer: Self-pay | Admitting: Internal Medicine

## 2019-08-17 DIAGNOSIS — E042 Nontoxic multinodular goiter: Secondary | ICD-10-CM

## 2019-08-17 NOTE — Telephone Encounter (Signed)
Patient called to request thyroid ultrasound be scheduled. Please call patient at ph# [phone] to advise.

## 2019-08-17 NOTE — Telephone Encounter (Signed)
Routing to [employer], please reach out to patient and schedule a follow up with Dr. Cruzita Lederer per the message below.

## 2019-08-17 NOTE — Telephone Encounter (Signed)
Please advise.  Patient has not been seen since 03/31/18 and has one canceled appt.  Do you need to see her first?

## 2019-08-17 NOTE — Telephone Encounter (Signed)
I ordered it and she will need a follow-up visit within the next 1-2 mo

## 2019-08-18 NOTE — Telephone Encounter (Signed)
Patient is scheduled for appointment on 08/24/19 at 3:00 p.m.

## 2019-08-24 ENCOUNTER — Ambulatory Visit (INDEPENDENT_AMBULATORY_CARE_PROVIDER_SITE_OTHER): Payer: BC Managed Care – PPO | Admitting: Internal Medicine

## 2019-08-24 ENCOUNTER — Other Ambulatory Visit: Payer: Self-pay

## 2019-08-24 ENCOUNTER — Encounter: Payer: Self-pay | Admitting: Internal Medicine

## 2019-08-24 VITALS — BP 128/80 | HR 95 | Ht 65.0 in | Wt 166.0 lb

## 2019-08-24 DIAGNOSIS — E559 Vitamin D deficiency, unspecified: Secondary | ICD-10-CM | POA: Diagnosis not present

## 2019-08-24 DIAGNOSIS — Z8639 Personal history of other endocrine, nutritional and metabolic disease: Secondary | ICD-10-CM | POA: Diagnosis not present

## 2019-08-24 DIAGNOSIS — E042 Nontoxic multinodular goiter: Secondary | ICD-10-CM

## 2019-08-24 DIAGNOSIS — E538 Deficiency of other specified B group vitamins: Secondary | ICD-10-CM | POA: Diagnosis not present

## 2019-08-24 NOTE — Patient Instructions (Signed)
We will schedule another thyroid ultrasound.  Please return to see me in 2 years.

## 2019-08-24 NOTE — Progress Notes (Signed)
Subjective:     Patient ID: Ashley Allen, female   DOB: 14-Feb-1971, 48 y.o.   MRN: [id number]  This visit occurred during the SARS-CoV-2 public health emergency.  Safety protocols were in place, including screening questions prior to the visit, additional usage of staff PPE, and extensive cleaning of exam room while observing appropriate contact time as indicated for disinfecting solutions.   HPI Ms. Ashley Allen is a 48 y.o.y/o woman returning for follow-up for h/o hypothyroidism and thyroid nodules. Last visit 1 year and 4 months ago.  She is working from home during the coronavirus pandemic.  She is going to the gym more and stays more active and she lost approximately 10 pounds since last visit.  Hypothyroidism: Reviewed history: She was started on Synthroid by her previous endocrinologist in 2008, for goiter, but she does not remember having blood work or thyroid U/S done, so it is unclear whether this was started for hypothyroidism or in an attempt to decrease the size of a thyroid nodule that was palpated at that time.   She took the medication for about 6 months then, then stopped it 8 years ago per [facility] recs. She had normal TSH (0.39 on 07/03/2012 - LLN 0.35) despite being off Synthroid. She did not feel different after starting or after stopping the medication.   At her visit with her PCP, in 08/18/2012, she had multiple vague complaints: fatigue, wt gain, constipation, feeling cold. She was wondering whether she needed to restart Synthroid at that time so she was referred to endocrinology.  [place] [facility] [place] she started to see a naturopath, who started Cytomel 10 mg bid in 07/2014 >> she felt well, but did not know whether this is mandatory medication so she tapered it off, now completely off for last 2 weeks.   I reviewed thyroid labs from her naturopath: 11/03/2014, while on Cytomel: TSH 0.02 (0.4-4.5), free T4 0.9 (0.8-1.8), free T3 4.2 (2.3-4.2), reverse T3 11 (8-25), serum  iodine 44 (50-109). A note was made that the thyroid status was "perfect".  Her TFTs were normal: Lab Results  Component Value Date   TSH 1.03 03/19/2019   TSH 0.72 03/31/2018   TSH 0.82 04/08/2017   TSH 0.62 12/07/2015   TSH 0.87 04/22/2015   TSH 0.85 03/03/2015   TSH 0.921 08/28/2012   TSH 0.39 07/03/2012   FREET4 0.88 03/19/2019   FREET4 0.77 04/08/2017   FREET4 0.90 04/22/2015   FREET4 0.81 03/03/2015   FREET4 1.25 08/28/2012   Her thyroid antibodies were not elevated: Component     Latest Ref Rng & Units 1 08/28/2012:  TSI     <140 % baseline 75  Thyroperoxidase Ab SerPl-aCnc     <35.0 IU/mL 26.3   Thyroid nodules:  Patient had a thyroid U/S (05/2014): 2 thyroid nodules:  Isoechoic nodule at right superior pole and isoechoicnodule at right inferior. Normal vascularity, no calcification.   Right upper: 1.6 x 1.0 x 1.4 cm, right lower: 2.3 x 1.8 x 1.9 cm    FNA (06/21/2014): 1. Thyroid nodule, right upper, FNA (smears):  - Bethesda category: 2Benign.  - Benign follicular nodule, consistent with colloid nodule.    2. Thyroid nodule, right lower, FNA (smears):  - Bethesda category: 2Benign.  - Benign follicular nodule, consistent with colloid nodule.    Thyroid U/S (07/10/2016): Isthmus: 0.5 cm. No discrete nodules are identified within the thyroid isthmus. Right lobe: 6.5 x 2.1 x 2.3 cm Nodule # 1: Location: Right; Mid Size:  More discrete nodular area measures 1.4 x 1.0 x 1.1 cm. Additional heterogeneous tissue present when included conglomerate nodular region measures as much as 3.0 x 1.1 x 2.1 cm. Composition: solid/almost completely solid (2) Echogenicity: isoechoic (1) *Given size (>/= 1.5 - 2.4 cm) and appearance, a follow-up ultrasound in 1 year should be considered based on TI-RADS criteria. Left lobe: 5.5 x 1.2 x 2.0 cm No discrete nodules are identified within the left lobe of the thyroid.  IMPRESSION: Nodular solid region within  the right lobe of the thyroid gland. This nodular abnormality is very difficult to measure due to adjacent heterogeneous tissue. Discrete nodule may measure is little as 1.4 cm or as much as 3 cm. Correlation suggested with prior imaging and biopsy results. Given size discrepancy, the best approach would likely be a follow-up ultrasound in 1 year.  Thyroid U/S (08/12/2017):  Isthmus: 0.3 cm, previously 0.5 cm Right lobe: 6.3 x 1.7 x 2.4 cm, previously 6.4 x 2.1 x 2.3 cm Left lobe: 6.3 x 1.0 x 2.0 cm, previously 5.5 x 1.2 x 2.0 cm ________________________________________  Nodule # 1: Prior biopsy: No Location: Right; Mid Maximum size: 1.0 cm; Other 2 dimensions: 0.9 x 0.7 cm, previously, 1.4 x 1.0 x 1.1 cm Composition: solid/almost completely solid (2) Echogenicity: hypoechoic (2) ACR TI-RADS recommendations: *Given size (>/= 1 - 1.4 cm) and appearance, a follow-up ultrasound in 1 year should be considered based on TI-RADS criteria.  _________________________________________________________   Nodule # 2: Prior biopsy: No Location: Right; Inferior Maximum size: 3.0 cm; Other 2 dimensions: 1.7 x 1.1 cm, previously, 3.0 x 1.1 x 2.1 cm Composition: solid/almost completely solid (2) Echogenicity: hypoechoic (2)  ACR TI-RADS recommendations: **Given size (>/= 1.5 cm) and appearance, fine needle aspiration of this moderately suspicious nodule should be considered based on TI-RADS criteria. _________________________________________________________  IMPRESSION: Right nodule 1 is smaller or but now meets criteria for annual follow-up. It has changed in appearance and is now a TR4 lesion. Right nodule 2 is stable and continues to meet criteria for fine needle aspiration biopsy.  Pt denies: - feeling nodules in neck - hoarseness - dysphagia - choking - SOB with lying down  Mother and cousin have Graves' disease. No FH of thyroid cancer. No h/o radiation tx to head or  neck.  No seaweed or kelp. No recent contrast studies. No herbal supplements. No Biotin use. No recent steroids use.   Her vitamin B12 and D were low in the past but normalized after starting supplementation:  Lab Results  Component Value Date   VITAMINB12 1,374 (H) 03/19/2019   VITAMINB12 1,159 (H) 12/31/2017   VITAMINB12 991 (H) 06/20/2017   VITAMINB12 296 04/08/2017  On 1000 mcg vitamin B12 every other day, changed after the last result return.  Lab Results  Component Value Date   VD25OH 48.47 03/19/2019   VD25OH 32.56 12/31/2017   VD25OH 29.28 (L) 06/20/2017   VD25OH 24.70 (L) 04/08/2017   VD25OH 50 07/03/2012  On 2000 units vitamin D daily.  ROS: Constitutional: no weight gain/ + intentional weight loss, no fatigue, no subjective hyperthermia, no subjective hypothermia Eyes: no blurry vision, no xerophthalmia ENT: no sore throat,  Cardiovascular: no CP/no SOB/no palpitations/no leg swelling Respiratory: no cough/no SOB/no wheezing Gastrointestinal: no N/no V/no D/no C/no acid reflux Musculoskeletal: no muscle aches/no joint aches Skin: no rashes, no hair loss Neurological: no tremors/no numbness/no tingling/no dizziness  I reviewed pt's medications, allergies, PMH, social hx, family hx, and changes were documented in  the history of present illness. Otherwise, unchanged from my initial visit note.   Past Medical History:  Diagnosis Date  . Anxiety   . Chicken pox   . Depression   . GERD (gastroesophageal reflux disease)   . Hx of melanoma of skin 06/20/2017   Sees dermatologist  . Sleep apnea   . Thyroid nodule    benign  . Ulcer   . UTI (urinary tract infection)    Past Surgical History:  Procedure Laterality Date  . BREAST ENHANCEMENT SURGERY  2007  . NASAL SINUS SURGERY  ?2010   History   Social History  . Marital Status: Single    Spouse Name: N/A    Number of Children: 0  . Years of Education: 30, college   Occupational History  . Sales [employer]   Social History Main Topics  . Smoking status: Former Research scientist (life sciences)  . Smokeless tobacco: Not on file     Comment: previous social smoker   . Alcohol Use: Yes  . Drug Use: No   Current Outpatient Medications on File Prior to Visit  Medication Sig Dispense Refill  . benzonatate (TESSALON) 100 MG capsule Take 1 capsule (100 mg total) by mouth 3 (three) times daily as needed for cough. 20 capsule 0  . Cholecalciferol (VITAMIN D3) 25 MCG (1000 UT) CAPS Take by mouth.    . doxycycline (VIBRA-TABS) 100 MG tablet Take 1 tablet (100 mg total) by mouth 2 (two) times daily. 14 tablet 0  . fluticasone (FLONASE) 50 MCG/ACT nasal spray USE 1 SPRAY INTO EACH NOSTRIL TWICE DAILY 16 g 3  . ipratropium (ATROVENT) 0.06 % nasal spray 2 sprays by Each Nare route Three (3) times a day.    . levonorgestrel (MIRENA) 20 MCG/24HR IUD 1 each by Intrauterine route once.    . NON FORMULARY Inflavonoid Intensive Care    . omeprazole (PRILOSEC) 20 MG capsule Take 1 capsule (20 mg total) by mouth daily. 90 capsule 1  . valACYclovir (VALTREX) 500 MG tablet 500mg  bid x 3 days at onset symptoms. 30 tablet 1  . venlafaxine XR (EFFEXOR-XR) 75 MG 24 hr capsule TAKE 1 CAPSULE BY MOUTH DAILY WITH BREAKFAST 90 capsule 1  . VENTOLIN HFA 108 (90 Base) MCG/ACT inhaler INHALE 2 PUFFS BY MOUTH EVERY 4 HOURS     No current facility-administered medications on file prior to visit.    Allergies  Allergen Reactions  . Polysporin [Bacitracin-Polymyxin B]   . Sulfonamide Derivatives     REACTION: Itching   Family History  Problem Relation Age of Onset  . Alcohol abuse Father   . Arthritis Father   . Heart disease Father   . Hypertension Father   . Emphysema Father        heavy smoker  . Arthritis Mother   . Hyperlipidemia Mother   . Hypertension Mother   . Uterine cancer Other   . Colon cancer Other   . Sudden death Paternal Grandmother   . Cancer Other        Aunts  . Colon cancer Paternal Aunt   . Stomach cancer  Maternal Aunt        spread to colon   . Cervical cancer Paternal Aunt    Review of Systems  Constitutional: no weight gain/no weight loss, no fatigue, no subjective hyperthermia, no subjective hypothermia Eyes: no blurry vision, no xerophthalmia ENT: no sore throat, + see HPI Cardiovascular: no CP/no SOB/no palpitations/no leg swelling Respiratory: no cough/no SOB/no wheezing  Gastrointestinal: no N/no V/no D/no C/no acid reflux Musculoskeletal: no muscle aches/no joint aches Skin: no rashes, no hair loss Neurological: no tremors/no numbness/no tingling/no dizziness  I reviewed pt's medications, allergies, PMH, social hx, family hx, and changes were documented in the history of present illness. Otherwise, unchanged from my initial visit note.  Past Medical History:  Diagnosis Date  . Anxiety   . Chicken pox   . Depression   . GERD (gastroesophageal reflux disease)   . Hx of melanoma of skin 06/20/2017   Sees dermatologist  . Sleep apnea   . Thyroid nodule    benign  . Ulcer   . UTI (urinary tract infection)    Past Surgical History:  Procedure Laterality Date  . BREAST ENHANCEMENT SURGERY  2007  . NASAL SINUS SURGERY  ?2010   Social History   Socioeconomic History  . Marital status: Single    Spouse name: Not on file  . Number of children: 0  . Years of education: 40  . Highest education level: Not on file  Occupational History  . Occupation: Investment banker, corporate: [employer]  Social Needs  . Financial resource strain: Not on file  . Food insecurity    Worry: Not on file    Inability: Not on file  . Transportation needs    Medical: Not on file    Non-medical: Not on file  Tobacco Use  . Smoking status: Former Smoker    Years: 3.00    Types: Cigarettes    Quit date: 09/24/2009    Years since quitting: 9.9  . Smokeless tobacco: Never Used  . Tobacco comment: previous social smoker   Substance and Sexual Activity  . Alcohol use: Yes  . Drug use: No  . Sexual  activity: Not on file  Lifestyle  . Physical activity    Days per week: Not on file    Minutes per session: Not on file  . Stress: Not on file  Relationships  . Social Musician on phone: Not on file    Gets together: Not on file    Attends religious service: Not on file    Active member of club or organization: Not on file    Attends meetings of clubs or organizations: Not on file    Relationship status: Not on file  . Intimate partner violence    Fear of current or ex partner: Not on file    Emotionally abused: Not on file    Physically abused: Not on file    Forced sexual activity: Not on file  Other Topics Concern  . Not on file  Social History Narrative   Regular exercise-yes   Caffeine Use-yes   Current Outpatient Medications on File Prior to Visit  Medication Sig Dispense Refill  . benzonatate (TESSALON) 100 MG capsule Take 1 capsule (100 mg total) by mouth 3 (three) times daily as needed for cough. 20 capsule 0  . Cholecalciferol (VITAMIN D3) 25 MCG (1000 UT) CAPS Take by mouth.    . doxycycline (VIBRA-TABS) 100 MG tablet Take 1 tablet (100 mg total) by mouth 2 (two) times daily. 14 tablet 0  . fluticasone (FLONASE) 50 MCG/ACT nasal spray USE 1 SPRAY INTO EACH NOSTRIL TWICE DAILY 16 g 3  . ipratropium (ATROVENT) 0.06 % nasal spray 2 sprays by Each Nare route Three (3) times a day.    . levonorgestrel (MIRENA) 20 MCG/24HR IUD 1 each by Intrauterine route once.    Marland Kitchen  NON FORMULARY Inflavonoid Intensive Care    . omeprazole (PRILOSEC) 20 MG capsule Take 1 capsule (20 mg total) by mouth daily. 90 capsule 1  . valACYclovir (VALTREX) 500 MG tablet  bid x 3 days at onset symptoms. 30 tablet 1  . venlafaxine XR (EFFEXOR-XR) 75 MG 24 hr capsule TAKE 1 CAPSULE BY MOUTH DAILY WITH BREAKFAST 90 capsule 1  . VENTOLIN HFA 108 (90 Base) MCG/ACT inhaler INHALE 2 PUFFS BY MOUTH EVERY 4 HOURS     No current facility-administered medications on file prior to visit.     Allergies  Allergen Reactions  . Polysporin [Bacitracin-Polymyxin B]   . Sulfonamide Derivatives     REACTION: Itching   Family History  Problem Relation Age of Onset  . Alcohol abuse Father   . Arthritis Father   . Heart disease Father   . Hypertension Father   . Emphysema Father        heavy smoker  . Arthritis Mother   . Hyperlipidemia Mother   . Hypertension Mother   . Uterine cancer Other   . Colon cancer Other   . Sudden death Paternal Grandmother   . Cancer Other        Aunts  . Colon cancer Paternal Aunt   . Stomach cancer Maternal Aunt        spread to colon   . Cervical cancer Paternal Aunt     Objective:   Physical Exam BP 128/80   Pulse 95   Ht  (1.651 m)   Wt 166 lb (75.3 kg)   SpO2 98%   BMI 27.62 kg/m  Body mass index is 27.62 kg/m. Wt Readings from Last 3 Encounters:  08/24/19 166 lb (75.3 kg)  04/07/19 160 lb (72.6 kg)  03/19/19 161 lb 3.2 oz (73.1 kg)   Constitutional: slightly overweight, in NAD Eyes: PERRLA, EOMI, no exophthalmos ENT: moist mucous membranes, no thyromegaly, no cervical lymphadenopathy Cardiovascular: RRR, No MRG Respiratory: CTA B Gastrointestinal: abdomen soft, NT, ND, BS+ Musculoskeletal: no deformities, strength intact in all 4 Skin: moist, warm, no rashes Neurological: no tremor with outstretched hands, DTR normal in all 4  Assessment:     1. H/o Hypothyroidism - now euthyroid  2. 2 thyroid nodules    Plan:     1. H/o Hypothyroidism  - Labs reviewed since 2013 >> normal - We we will continue to check her thyroid hormones annually -last TSH was normal in 02/2019.  2. Thyroid nodules -She has a history of 2 right thyroid nodules in the 2015, but the images were not available for review.  These nodules were biopsied in 2015 and the results were normal.  We repeated the thyroid ultrasound in 06/2016 and this showed a conglomerate of nodules in the right thyroid lobe.  We rechecked another ultrasound in  07/2017 and the nodules appeared not worrisome: One of the nodules change from isoechoic to hypoechoic but it decreased in size and we discussed that this was reassuring.  We discussed about repeating the ultrasound in 3 years from then, at this visit.  If this ultrasound does not show that the nodules have increased in size, we may need another ultrasound in approximately 3 years. -Of note, she has no neck compression symptoms -I will see her back in 2 years  3.  Vitamin D insufficiency -We started 2000 units vitamin D daily -Latest vitamin D level was normal as checked by PCP in 02/2019  4. Low B12 - Her B12 was  low, at 296 initially and I advised her to start 1000 mcg vitamin B12 daily -Latest level was slightly high as checked by PCP in 02/2019 and her dose was reduced to every other day.  However, she tells me that she stopped this after the last visit with PCP.  She is wondering whether she needs to restart.  I advised her to start at 500 mcg daily, as advised by PCP.   I will addend the results when they become available.   Carlus Pavlov, MD PhD [facility] .

## 2019-08-25 DIAGNOSIS — S338XXA Sprain of other parts of lumbar spine and pelvis, initial encounter: Secondary | ICD-10-CM | POA: Diagnosis not present

## 2019-08-25 DIAGNOSIS — S134XXA Sprain of ligaments of cervical spine, initial encounter: Secondary | ICD-10-CM | POA: Diagnosis not present

## 2019-08-25 DIAGNOSIS — S233XXA Sprain of ligaments of thoracic spine, initial encounter: Secondary | ICD-10-CM | POA: Diagnosis not present

## 2019-09-02 ENCOUNTER — Other Ambulatory Visit: Payer: BC Managed Care – PPO

## 2019-09-08 ENCOUNTER — Ambulatory Visit
Admission: RE | Admit: 2019-09-08 | Discharge: 2019-09-08 | Disposition: A | Discharge status: Home / Self Care | Payer: BC Managed Care – PPO | Source: Ambulatory Visit | Attending: Internal Medicine | Admitting: Internal Medicine

## 2019-09-08 DIAGNOSIS — E042 Nontoxic multinodular goiter: Secondary | ICD-10-CM

## 2019-09-08 DIAGNOSIS — E041 Nontoxic single thyroid nodule: Secondary | ICD-10-CM | POA: Diagnosis not present

## 2019-09-23 ENCOUNTER — Other Ambulatory Visit: Payer: Self-pay | Admitting: Family Medicine

## 2019-10-01 ENCOUNTER — Other Ambulatory Visit: Payer: Self-pay | Admitting: Family Medicine

## 2019-10-01 DIAGNOSIS — S338XXA Sprain of other parts of lumbar spine and pelvis, initial encounter: Secondary | ICD-10-CM | POA: Diagnosis not present

## 2019-10-01 DIAGNOSIS — S134XXA Sprain of ligaments of cervical spine, initial encounter: Secondary | ICD-10-CM | POA: Diagnosis not present

## 2019-10-01 DIAGNOSIS — S233XXA Sprain of ligaments of thoracic spine, initial encounter: Secondary | ICD-10-CM | POA: Diagnosis not present

## 2019-10-09 DIAGNOSIS — F411 Generalized anxiety disorder: Secondary | ICD-10-CM | POA: Diagnosis not present

## 2019-10-15 ENCOUNTER — Other Ambulatory Visit: Payer: Self-pay | Admitting: Family Medicine

## 2019-11-05 DIAGNOSIS — S134XXA Sprain of ligaments of cervical spine, initial encounter: Secondary | ICD-10-CM | POA: Diagnosis not present

## 2019-11-05 DIAGNOSIS — S338XXA Sprain of other parts of lumbar spine and pelvis, initial encounter: Secondary | ICD-10-CM | POA: Diagnosis not present

## 2019-11-05 DIAGNOSIS — S233XXA Sprain of ligaments of thoracic spine, initial encounter: Secondary | ICD-10-CM | POA: Diagnosis not present

## 2019-12-03 DIAGNOSIS — S233XXA Sprain of ligaments of thoracic spine, initial encounter: Secondary | ICD-10-CM | POA: Diagnosis not present

## 2019-12-03 DIAGNOSIS — S338XXA Sprain of other parts of lumbar spine and pelvis, initial encounter: Secondary | ICD-10-CM | POA: Diagnosis not present

## 2019-12-03 DIAGNOSIS — S134XXA Sprain of ligaments of cervical spine, initial encounter: Secondary | ICD-10-CM | POA: Diagnosis not present

## 2019-12-21 ENCOUNTER — Ambulatory Visit (INDEPENDENT_AMBULATORY_CARE_PROVIDER_SITE_OTHER): Payer: BC Managed Care – PPO | Admitting: Psychology

## 2019-12-21 DIAGNOSIS — F4323 Adjustment disorder with mixed anxiety and depressed mood: Secondary | ICD-10-CM

## 2019-12-30 DIAGNOSIS — S233XXA Sprain of ligaments of thoracic spine, initial encounter: Secondary | ICD-10-CM | POA: Diagnosis not present

## 2019-12-30 DIAGNOSIS — S338XXA Sprain of other parts of lumbar spine and pelvis, initial encounter: Secondary | ICD-10-CM | POA: Diagnosis not present

## 2019-12-30 DIAGNOSIS — S134XXA Sprain of ligaments of cervical spine, initial encounter: Secondary | ICD-10-CM | POA: Diagnosis not present

## 2020-01-08 ENCOUNTER — Other Ambulatory Visit: Payer: Self-pay | Admitting: Family Medicine

## 2020-01-12 DIAGNOSIS — H02834 Dermatochalasis of left upper eyelid: Secondary | ICD-10-CM | POA: Diagnosis not present

## 2020-01-12 DIAGNOSIS — D485 Neoplasm of uncertain behavior of skin: Secondary | ICD-10-CM | POA: Diagnosis not present

## 2020-01-12 DIAGNOSIS — H02831 Dermatochalasis of right upper eyelid: Secondary | ICD-10-CM | POA: Diagnosis not present

## 2020-01-18 ENCOUNTER — Other Ambulatory Visit: Payer: Self-pay | Admitting: Family Medicine

## 2020-01-21 ENCOUNTER — Ambulatory Visit (INDEPENDENT_AMBULATORY_CARE_PROVIDER_SITE_OTHER): Payer: BC Managed Care – PPO | Admitting: Psychology

## 2020-01-21 DIAGNOSIS — F4323 Adjustment disorder with mixed anxiety and depressed mood: Secondary | ICD-10-CM | POA: Diagnosis not present

## 2020-01-25 ENCOUNTER — Ambulatory Visit: Payer: BC Managed Care – PPO | Admitting: Psychology

## 2020-01-27 DIAGNOSIS — S338XXA Sprain of other parts of lumbar spine and pelvis, initial encounter: Secondary | ICD-10-CM | POA: Diagnosis not present

## 2020-01-27 DIAGNOSIS — S233XXA Sprain of ligaments of thoracic spine, initial encounter: Secondary | ICD-10-CM | POA: Diagnosis not present

## 2020-01-27 DIAGNOSIS — S134XXA Sprain of ligaments of cervical spine, initial encounter: Secondary | ICD-10-CM | POA: Diagnosis not present

## 2020-01-28 DIAGNOSIS — L720 Epidermal cyst: Secondary | ICD-10-CM | POA: Diagnosis not present

## 2020-01-28 DIAGNOSIS — D485 Neoplasm of uncertain behavior of skin: Secondary | ICD-10-CM | POA: Diagnosis not present

## 2020-01-29 ENCOUNTER — Other Ambulatory Visit: Payer: Self-pay | Admitting: Family Medicine

## 2020-02-05 DIAGNOSIS — F411 Generalized anxiety disorder: Secondary | ICD-10-CM | POA: Diagnosis not present

## 2020-02-05 DIAGNOSIS — F339 Major depressive disorder, recurrent, unspecified: Secondary | ICD-10-CM | POA: Diagnosis not present

## 2020-02-08 ENCOUNTER — Ambulatory Visit: Payer: BC Managed Care – PPO | Admitting: Psychology

## 2020-02-15 DIAGNOSIS — Z85828 Personal history of other malignant neoplasm of skin: Secondary | ICD-10-CM | POA: Diagnosis not present

## 2020-02-15 DIAGNOSIS — L821 Other seborrheic keratosis: Secondary | ICD-10-CM | POA: Diagnosis not present

## 2020-02-15 DIAGNOSIS — D485 Neoplasm of uncertain behavior of skin: Secondary | ICD-10-CM | POA: Diagnosis not present

## 2020-02-15 DIAGNOSIS — L570 Actinic keratosis: Secondary | ICD-10-CM | POA: Diagnosis not present

## 2020-02-15 DIAGNOSIS — Z85820 Personal history of malignant melanoma of skin: Secondary | ICD-10-CM | POA: Diagnosis not present

## 2020-02-15 DIAGNOSIS — D2261 Melanocytic nevi of right upper limb, including shoulder: Secondary | ICD-10-CM | POA: Diagnosis not present

## 2020-02-23 ENCOUNTER — Ambulatory Visit: Payer: BC Managed Care – PPO | Admitting: Psychology

## 2020-02-29 DIAGNOSIS — S233XXA Sprain of ligaments of thoracic spine, initial encounter: Secondary | ICD-10-CM | POA: Diagnosis not present

## 2020-02-29 DIAGNOSIS — S338XXA Sprain of other parts of lumbar spine and pelvis, initial encounter: Secondary | ICD-10-CM | POA: Diagnosis not present

## 2020-02-29 DIAGNOSIS — S134XXA Sprain of ligaments of cervical spine, initial encounter: Secondary | ICD-10-CM | POA: Diagnosis not present

## 2020-03-13 DIAGNOSIS — F419 Anxiety disorder, unspecified: Secondary | ICD-10-CM | POA: Diagnosis not present

## 2020-03-13 DIAGNOSIS — W540XXA Bitten by dog, initial encounter: Secondary | ICD-10-CM | POA: Diagnosis not present

## 2020-03-13 DIAGNOSIS — J309 Allergic rhinitis, unspecified: Secondary | ICD-10-CM | POA: Diagnosis not present

## 2020-03-13 DIAGNOSIS — S81831A Puncture wound without foreign body, right lower leg, initial encounter: Secondary | ICD-10-CM | POA: Diagnosis not present

## 2020-03-21 DIAGNOSIS — Z85828 Personal history of other malignant neoplasm of skin: Secondary | ICD-10-CM | POA: Diagnosis not present

## 2020-03-21 DIAGNOSIS — Z85820 Personal history of malignant melanoma of skin: Secondary | ICD-10-CM | POA: Diagnosis not present

## 2020-03-21 DIAGNOSIS — B078 Other viral warts: Secondary | ICD-10-CM | POA: Diagnosis not present

## 2020-03-21 DIAGNOSIS — L723 Sebaceous cyst: Secondary | ICD-10-CM | POA: Diagnosis not present

## 2020-03-28 DIAGNOSIS — S338XXA Sprain of other parts of lumbar spine and pelvis, initial encounter: Secondary | ICD-10-CM | POA: Diagnosis not present

## 2020-03-28 DIAGNOSIS — S233XXA Sprain of ligaments of thoracic spine, initial encounter: Secondary | ICD-10-CM | POA: Diagnosis not present

## 2020-03-28 DIAGNOSIS — S134XXA Sprain of ligaments of cervical spine, initial encounter: Secondary | ICD-10-CM | POA: Diagnosis not present

## 2020-04-01 ENCOUNTER — Other Ambulatory Visit: Payer: Self-pay | Admitting: Family Medicine

## 2020-05-02 DIAGNOSIS — S338XXA Sprain of other parts of lumbar spine and pelvis, initial encounter: Secondary | ICD-10-CM | POA: Diagnosis not present

## 2020-05-02 DIAGNOSIS — S134XXA Sprain of ligaments of cervical spine, initial encounter: Secondary | ICD-10-CM | POA: Diagnosis not present

## 2020-05-02 DIAGNOSIS — S233XXA Sprain of ligaments of thoracic spine, initial encounter: Secondary | ICD-10-CM | POA: Diagnosis not present

## 2020-05-04 ENCOUNTER — Other Ambulatory Visit: Payer: Self-pay | Admitting: Family Medicine

## 2020-05-27 ENCOUNTER — Ambulatory Visit (INDEPENDENT_AMBULATORY_CARE_PROVIDER_SITE_OTHER): Payer: BC Managed Care – PPO | Admitting: Family Medicine

## 2020-05-27 ENCOUNTER — Other Ambulatory Visit: Payer: Self-pay

## 2020-05-27 ENCOUNTER — Other Ambulatory Visit (HOSPITAL_COMMUNITY)
Admission: RE | Admit: 2020-05-27 | Discharge: 2020-05-27 | Disposition: A | Discharge status: Home / Self Care | Payer: BC Managed Care – PPO | Source: Ambulatory Visit | Attending: Family Medicine | Admitting: Family Medicine

## 2020-05-27 ENCOUNTER — Encounter: Payer: Self-pay | Admitting: Family Medicine

## 2020-05-27 VITALS — BP 132/82 | HR 78 | Temp 98.0°F | Ht 64.75 in | Wt 166.1 lb

## 2020-05-27 DIAGNOSIS — F339 Major depressive disorder, recurrent, unspecified: Secondary | ICD-10-CM

## 2020-05-27 DIAGNOSIS — Z131 Encounter for screening for diabetes mellitus: Secondary | ICD-10-CM | POA: Diagnosis not present

## 2020-05-27 DIAGNOSIS — E042 Nontoxic multinodular goiter: Secondary | ICD-10-CM | POA: Diagnosis not present

## 2020-05-27 DIAGNOSIS — Z124 Encounter for screening for malignant neoplasm of cervix: Secondary | ICD-10-CM | POA: Diagnosis not present

## 2020-05-27 DIAGNOSIS — R7989 Other specified abnormal findings of blood chemistry: Secondary | ICD-10-CM | POA: Diagnosis not present

## 2020-05-27 DIAGNOSIS — E785 Hyperlipidemia, unspecified: Secondary | ICD-10-CM

## 2020-05-27 DIAGNOSIS — G4733 Obstructive sleep apnea (adult) (pediatric): Secondary | ICD-10-CM | POA: Diagnosis not present

## 2020-05-27 DIAGNOSIS — E538 Deficiency of other specified B group vitamins: Secondary | ICD-10-CM | POA: Diagnosis not present

## 2020-05-27 DIAGNOSIS — Z23 Encounter for immunization: Secondary | ICD-10-CM

## 2020-05-27 DIAGNOSIS — Z85820 Personal history of malignant melanoma of skin: Secondary | ICD-10-CM

## 2020-05-27 DIAGNOSIS — Z0000 Encounter for general adult medical examination without abnormal findings: Secondary | ICD-10-CM

## 2020-05-27 DIAGNOSIS — Z113 Encounter for screening for infections with a predominantly sexual mode of transmission: Secondary | ICD-10-CM | POA: Insufficient documentation

## 2020-05-27 DIAGNOSIS — K219 Gastro-esophageal reflux disease without esophagitis: Secondary | ICD-10-CM

## 2020-05-27 DIAGNOSIS — Z8639 Personal history of other endocrine, nutritional and metabolic disease: Secondary | ICD-10-CM

## 2020-05-27 DIAGNOSIS — M549 Dorsalgia, unspecified: Secondary | ICD-10-CM

## 2020-05-27 LAB — POC URINALSYSI DIPSTICK (AUTOMATED)
Bilirubin, UA: NEGATIVE
Blood, UA: NEGATIVE
Glucose, UA: NEGATIVE
Ketones, UA: NEGATIVE
Leukocytes, UA: NEGATIVE
Nitrite, UA: NEGATIVE
Protein, UA: NEGATIVE
Spec Grav, UA: 1.01 (ref 1.010–1.025)
Urobilinogen, UA: 0.2 E.U./dL
pH, UA: 6 (ref 5.0–8.0)

## 2020-05-27 NOTE — Progress Notes (Signed)
Ashley Allen DOB: Jun 20, 1971 Encounter date: 05/27/2020  This is a 49 y.o. female who presents for complete physical   History of present illness/Additional concerns: Last visit with me was June/2020 and was virtual.  Lower back is hurting; not sure why, not constant, but there 75% of the time. Sees chiropractor and hasn't been there in a couple of weeks. Both sides, no radiation of pain.   Gyn encouraged her to consider invitae genetic testing.   Pain in breasts is doing better. Surgery will be very $12,000 because considered cosmetic.   OSA: went through eval; they were going to do mouthguard but this was very pricey and was going to mess up her bite permanently. Hasn't gone back to machine yet, because didn't tolerate it.   GERD: omeprazole 62m daily - just using as needed (when she eats food trigger). Tries to avoid things that trigger.   Sees gyn: has mirena 2 years ago. Has been happy with IUD.   Depression/anxiety: doing better with wellbutrin. Just put dog down last week.   Hypothyroid history/thyroid nodules: following w endocrinology. Stable on last check.   Low vitamin d: taking supplement  Low vitamin b12: taking supplement.   Hx melanoma skin: arm and 3 other places with other skin precancers. Follows with derm; (Dr. JMartinique. Has seen her this year.   HL: has been working on weight loss - fasting, exercising.  Hoping that this will help with both lipids/sugar. has been a bad two weeks of eating, but plans to get back on track. Exercising at least 2-3 times/week.  Allergies: flonase, zyrtec  Had colonoscopy in Jan - multiple polyps noted. Repeat suggested in 1-3 years. She is good about follow up on this.   Past Medical History:  Diagnosis Date  . Anxiety   . Chicken pox   . Depression   . GERD (gastroesophageal reflux disease)   . Hx of melanoma of skin 06/20/2017   Sees dermatologist  . Sleep apnea   . Thyroid nodule    benign  . Ulcer   . UTI  (urinary tract infection)    Past Surgical History:  Procedure Laterality Date  . BREAST ENHANCEMENT SURGERY  2007  . NASAL SINUS SURGERY  ?2010   Allergies  Allergen Reactions  . Flexeril [Cyclobenzaprine] Itching  . Polysporin [Bacitracin-Polymyxin B]   . Sulfonamide Derivatives     REACTION: Itching   Current Meds  Medication Sig  . buPROPion (WELLBUTRIN XL) 300 MG 24 hr tablet Take 300 mg by mouth every morning.  . Cholecalciferol (VITAMIN D3) 25 MCG (1000 UT) CAPS Take by mouth.  . levonorgestrel (MIRENA) 20 MCG/24HR IUD 1 each by Intrauterine route once.  . NON FORMULARY Inflavonoid Intensive Care  . valACYclovir (VALTREX) 500 MG tablet TAKE ONE TABLET BY MOUTH TWICE DAILY X 3 DAYS ONSET SYMPTOMS   Social History   Tobacco Use  . Smoking status: Former Smoker    Years: 3.00    Types: Cigarettes    Quit date: 09/24/2009    Years since quitting: 10.6  . Smokeless tobacco: Never Used  . Tobacco comment: previous social smoker   Substance Use Topics  . Alcohol use: Yes   Family History  Problem Relation Age of Onset  . Alcohol abuse Father   . Arthritis Father   . Heart disease Father   . Hypertension Father   . Emphysema Father        heavy smoker  . Arthritis Mother   . Hyperlipidemia Mother   .  Hypertension Mother   . Uterine cancer Other   . Colon cancer Other   . Heart attack Paternal Grandmother        while pregnant  . Cancer Other        Aunts  . Colon cancer Paternal Aunt   . Stomach cancer Maternal Aunt        spread to colon   . Cervical cancer Paternal Aunt   . High blood pressure Sister   . High Cholesterol Sister   . High blood pressure Brother   . Alcohol abuse Brother   . Stroke Maternal Grandmother 21  . Heart attack Maternal Grandfather 50  . Liver cancer Maternal Aunt   . High blood pressure Sister   . High Cholesterol Sister   . High blood pressure Brother   . Obesity Brother   . High blood pressure Brother   . Stroke Paternal  Uncle 39  . Stroke Paternal Uncle 40     Review of Systems  Constitutional: Negative for activity change, appetite change, chills, fatigue, fever and unexpected weight change.  HENT: Negative for congestion, ear pain, hearing loss, sinus pressure, sinus pain, sore throat and trouble swallowing.   Eyes: Negative for pain and visual disturbance.  Respiratory: Negative for cough, chest tightness, shortness of breath and wheezing.   Cardiovascular: Negative for chest pain, palpitations and leg swelling.  Gastrointestinal: Negative for abdominal pain, blood in stool, constipation, diarrhea, nausea and vomiting.  Genitourinary: Negative for difficulty urinating and menstrual problem.  Musculoskeletal: Positive for back pain (see hpi). Negative for arthralgias.  Skin: Negative for rash.  Neurological: Negative for dizziness, weakness, numbness and headaches.  Hematological: Negative for adenopathy. Does not bruise/bleed easily.  Psychiatric/Behavioral: Negative for sleep disturbance and suicidal ideas. The patient is not nervous/anxious.     CBC:  Lab Results  Component Value Date   WBC 7.2 03/19/2019   HGB 13.6 03/19/2019   HCT 41.4 03/19/2019   MCH 31.1 05/12/2010   MCHC 32.8 03/19/2019   RDW 14.7 03/19/2019   PLT 533.0 (H) 03/19/2019   CMP: Lab Results  Component Value Date   NA 137 03/19/2019   K 4.6 03/19/2019   CL 102 03/19/2019   CO2 26 03/19/2019   GLUCOSE 90 03/19/2019   BUN 4 (L) 03/19/2019   CREATININE 0.47 03/19/2019   GFRAA  05/12/2010    >60        The eGFR has been calculated using the MDRD equation. This calculation has not been validated in all clinical situations. eGFR's persistently <60 mL/min signify possible Chronic Kidney Disease.   CALCIUM 8.9 03/19/2019   PROT 7.1 03/19/2019   BILITOT 0.4 03/19/2019   ALKPHOS 103 03/19/2019   ALT 7 03/19/2019   AST 10 03/19/2019   LIPID: Lab Results  Component Value Date   CHOL 150 03/19/2019   TRIG 69.0  03/19/2019   HDL 42.20 03/19/2019   LDLCALC 94 03/19/2019    Objective:  BP 132/82 (BP Location: Left Arm, Patient Position: Sitting, Cuff Size: Large)   Pulse 78   Temp 98 F (36.7 C) (Oral)   Ht 5' 4.75" (1.645 m)   Wt 166 lb 1.6 oz (75.3 kg)   BMI 27.85 kg/m   Weight: 166 lb 1.6 oz (75.3 kg)   BP Readings from Last 3 Encounters:  05/27/20 132/82  08/24/19 128/80  04/07/19 (!) 133/92   Wt Readings from Last 3 Encounters:  05/27/20 166 lb 1.6 oz (75.3 kg)  08/24/19 166  lb (75.3 kg)  04/07/19 160 lb (72.6 kg)    Physical Exam Constitutional:      General: She is not in acute distress.    Appearance: She is well-developed.  HENT:     Head: Normocephalic and atraumatic.     Right Ear: External ear normal.     Left Ear: External ear normal.     Mouth/Throat:     Pharynx: No oropharyngeal exudate.  Eyes:     Conjunctiva/sclera: Conjunctivae normal.     Pupils: Pupils are equal, round, and reactive to light.  Neck:     Thyroid: No thyromegaly.  Cardiovascular:     Rate and Rhythm: Normal rate and regular rhythm.     Heart sounds: Normal heart sounds. No murmur heard.  No friction rub. No gallop.   Pulmonary:     Effort: Pulmonary effort is normal.     Breath sounds: Normal breath sounds.  Chest:     Comments: Breast exam is very difficult due to patient discomfort/capsular contracture; inferior breast bilaterally are very firm and difficult to palpate.   Abdominal:     General: Bowel sounds are normal. There is no distension.     Palpations: Abdomen is soft. There is no mass.     Tenderness: There is no abdominal tenderness. There is no guarding.     Hernia: No hernia is present.  Genitourinary:    Exam position: Supine.     Vagina: Normal.     Cervix: Normal.     Uterus: Normal.      Adnexa: Right adnexa normal and left adnexa normal.  Musculoskeletal:        General: No tenderness or deformity. Normal range of motion.     Cervical back: Normal range of  motion and neck supple.  Lymphadenopathy:     Cervical: No cervical adenopathy.  Skin:    General: Skin is warm and dry.     Findings: No rash.  Neurological:     Mental Status: She is alert and oriented to person, place, and time.     Deep Tendon Reflexes: Reflexes normal.     Reflex Scores:      Tricep reflexes are 2+ on the right side and 2+ on the left side.      Bicep reflexes are 2+ on the right side and 2+ on the left side.      Brachioradialis reflexes are 2+ on the right side and 2+ on the left side.      Patellar reflexes are 2+ on the right side and 2+ on the left side. Psychiatric:        Speech: Speech normal.        Behavior: Behavior normal.        Thought Content: Thought content normal.     Assessment/Plan: Health Maintenance Due  Topic Date Due  . Hepatitis C Screening  Never done  . PAP SMEAR-Modifier  04/25/2019   Health Maintenance reviewed - she wishes to  Wait to complete mammogram prior to surgery (when she chooses that); otherwise up to date.  1. Preventative health care Up to date with preventative care. Consider aggressive treatment cholesterol/blood pressure due to family history. Consider additional eval (ie coronary calcium screening) due to family history. Encouraged her to complete online application for genetic testing if desired that was given by gyn. This way they can run through insurance.  - Hepatitis C antibody; Future - Hepatitis C antibody  2. OSA (obstructive sleep apnea) Likely  will need f/u with pulm? Message sent to them today to see next best course since she could not afford dental appliance.   3. Gastroesophageal reflux disease, unspecified whether esophagitis present Well-controlled.  Continue medication as needed.  4. Multiple thyroid nodules Last imaging was completed in December/2020.  Per Dr. Cruzita Lederer - they are monitoring one of the nodules.   5. Depression, recurrent (HCC) Mood has been stable.  Continue Wellbutrin  daily.  6. Low vitamin D level - VITAMIN D 25 Hydroxy (Vit-D Deficiency, Fractures); Future - VITAMIN D 25 Hydroxy (Vit-D Deficiency, Fractures)  7. Low serum vitamin B12 - Vitamin B12; Future - Vitamin B12  8. Hx of melanoma of skin Follows regularly with dermatology.  9. Dyslipidemia - Comprehensive metabolic panel; Future - Lipid panel; Future - TSH; Future - TSH - Lipid panel - Comprehensive metabolic panel  10. Screening for diabetes mellitus - Hemoglobin A1c; Future - Hemoglobin A1c  11. H/O: hypothyroidism - CBC with Differential/Platelet; Future - CBC with Differential/Platelet  12. Cervical cancer screening - PAP [[facility]]  13. Screening for STD (sexually transmitted disease) - Cervicovaginal ancillary only  14. Back pain, unspecified back location, unspecified back pain laterality, unspecified chronicity Urinalysis is normal today.  Follow-up with chiropractor and let me know if pain is not improving. - POCT Urinalysis Dipstick (Automated)  15. Need for immunization against influenza - Flu Vaccine QUAD 6+ mos PF IM (Fluarix Quad PF)  Return in about 6 months (around 11/24/2020) for Chronic condition visit.  Micheline Rough, MD

## 2020-05-31 DIAGNOSIS — S338XXA Sprain of other parts of lumbar spine and pelvis, initial encounter: Secondary | ICD-10-CM | POA: Diagnosis not present

## 2020-05-31 DIAGNOSIS — S233XXA Sprain of ligaments of thoracic spine, initial encounter: Secondary | ICD-10-CM | POA: Diagnosis not present

## 2020-05-31 DIAGNOSIS — S134XXA Sprain of ligaments of cervical spine, initial encounter: Secondary | ICD-10-CM | POA: Diagnosis not present

## 2020-05-31 LAB — CBC WITH DIFFERENTIAL/PLATELET
Absolute Monocytes: 598 cells/uL (ref 200–950)
Basophils Absolute: 66 cells/uL (ref 0–200)
Basophils Relative: 0.8 %
Eosinophils Absolute: 216 cells/uL (ref 15–500)
Eosinophils Relative: 2.6 %
HCT: 44.9 % (ref 35.0–45.0)
Hemoglobin: 14.8 g/dL (ref 11.7–15.5)
Lymphs Abs: 1760 cells/uL (ref 850–3900)
MCH: 30.5 pg (ref 27.0–33.0)
MCHC: 33 g/dL (ref 32.0–36.0)
MCV: 92.6 fL (ref 80.0–100.0)
MPV: 10.2 fL (ref 7.5–12.5)
Monocytes Relative: 7.2 %
Neutro Abs: 5661 cells/uL (ref 1500–7800)
Neutrophils Relative %: 68.2 %
Platelets: 348 10*3/uL (ref 140–400)
RBC: 4.85 10*6/uL (ref 3.80–5.10)
RDW: 12 % (ref 11.0–15.0)
Total Lymphocyte: 21.2 %
WBC: 8.3 10*3/uL (ref 3.8–10.8)

## 2020-05-31 LAB — COMPREHENSIVE METABOLIC PANEL
AG Ratio: 1.6 (calc) (ref 1.0–2.5)
ALT: 15 U/L (ref 6–29)
AST: 15 U/L (ref 10–35)
Albumin: 4.2 g/dL (ref 3.6–5.1)
Alkaline phosphatase (APISO): 88 U/L (ref 31–125)
BUN: 8 mg/dL (ref 7–25)
CO2: 23 mmol/L (ref 20–32)
Calcium: 9.1 mg/dL (ref 8.6–10.2)
Chloride: 106 mmol/L (ref 98–110)
Creat: 0.62 mg/dL (ref 0.50–1.10)
Globulin: 2.7 g/dL (calc) (ref 1.9–3.7)
Glucose, Bld: 95 mg/dL (ref 65–99)
Potassium: 4.3 mmol/L (ref 3.5–5.3)
Sodium: 140 mmol/L (ref 135–146)
Total Bilirubin: 0.4 mg/dL (ref 0.2–1.2)
Total Protein: 6.9 g/dL (ref 6.1–8.1)

## 2020-05-31 LAB — HEMOGLOBIN A1C
Hgb A1c MFr Bld: 5 % of total Hgb (ref <5.7)
Mean Plasma Glucose: 97 (calc)
eAG (mmol/L): 5.4 (calc)

## 2020-05-31 LAB — CERVICOVAGINAL ANCILLARY ONLY
Chlamydia: NEGATIVE
Comment: NEGATIVE
Comment: NEGATIVE
Comment: NORMAL
Neisseria Gonorrhea: NEGATIVE
Trichomonas: NEGATIVE

## 2020-05-31 LAB — LIPID PANEL
Cholesterol: 170 mg/dL (ref <200)
HDL: 57 mg/dL (ref >=50)
LDL Cholesterol (Calc): 95 mg/dL (calc)
Non-HDL Cholesterol (Calc): 113 mg/dL (calc) (ref <130)
Total CHOL/HDL Ratio: 3 (calc) (ref <5.0)
Triglycerides: 90 mg/dL (ref <150)

## 2020-05-31 LAB — CYTOLOGY - PAP
Comment: NEGATIVE
Diagnosis: NEGATIVE
High risk HPV: NEGATIVE

## 2020-05-31 LAB — HEPATITIS C ANTIBODY
Hepatitis C Ab: NONREACTIVE
SIGNAL TO CUT-OFF: 0.01 (ref <1.00)

## 2020-05-31 LAB — VITAMIN D 25 HYDROXY (VIT D DEFICIENCY, FRACTURES): Vit D, 25-Hydroxy: 47 ng/mL (ref 30–100)

## 2020-05-31 LAB — VITAMIN B12: Vitamin B-12: 1287 pg/mL — ABNORMAL HIGH (ref 200–1100)

## 2020-05-31 LAB — TSH: TSH: 1.06 mIU/L

## 2020-06-06 ENCOUNTER — Telehealth: Payer: Self-pay | Admitting: *Deleted

## 2020-06-06 NOTE — Telephone Encounter (Signed)
-----   Message from Wynn Banker, MD sent at 06/06/2020  8:48 AM EDT ----- Please let patient know that Dr. Maple Hudson stated he was happy to have her follow up again to discuss/work on options to help with sleep apnea that may be better covered/tolerated. She should set up follow up visit if interested. ----- Message ----- From: Waymon Budge, MD Sent: 05/27/2020  12:07 PM EDT To: Wynn Banker, MD  Happy to see her again to look at options.  -Clint Young ----- Message ----- From: Wynn Banker, MD Sent: 05/27/2020  10:18 AM EDT To: Waymon Budge, MD  Hi - Barba was not able to afford dental appliance for sleep apnea because insurance didn't cover this. Would you recommend she try something else? Can she follow back up with you? In past she didn't tolerate machine, but that was 10 yrs ago. Thanks for your help. Jannette Spanner

## 2020-06-06 NOTE — Telephone Encounter (Signed)
Patient informed of the message below.

## 2020-06-10 DIAGNOSIS — Z8481 Family history of carrier of genetic disease: Secondary | ICD-10-CM | POA: Diagnosis not present

## 2020-06-10 DIAGNOSIS — Z85820 Personal history of malignant melanoma of skin: Secondary | ICD-10-CM | POA: Diagnosis not present

## 2020-06-10 DIAGNOSIS — Z8049 Family history of malignant neoplasm of other genital organs: Secondary | ICD-10-CM | POA: Diagnosis not present

## 2020-06-10 DIAGNOSIS — Z800 Family history of malignant neoplasm of digestive organs: Secondary | ICD-10-CM | POA: Diagnosis not present

## 2020-06-30 DIAGNOSIS — S338XXA Sprain of other parts of lumbar spine and pelvis, initial encounter: Secondary | ICD-10-CM | POA: Diagnosis not present

## 2020-06-30 DIAGNOSIS — S134XXA Sprain of ligaments of cervical spine, initial encounter: Secondary | ICD-10-CM | POA: Diagnosis not present

## 2020-06-30 DIAGNOSIS — S233XXA Sprain of ligaments of thoracic spine, initial encounter: Secondary | ICD-10-CM | POA: Diagnosis not present

## 2020-07-12 ENCOUNTER — Encounter: Payer: Self-pay | Admitting: Family Medicine

## 2020-07-26 DIAGNOSIS — S338XXA Sprain of other parts of lumbar spine and pelvis, initial encounter: Secondary | ICD-10-CM | POA: Diagnosis not present

## 2020-07-26 DIAGNOSIS — S233XXA Sprain of ligaments of thoracic spine, initial encounter: Secondary | ICD-10-CM | POA: Diagnosis not present

## 2020-07-26 DIAGNOSIS — S134XXA Sprain of ligaments of cervical spine, initial encounter: Secondary | ICD-10-CM | POA: Diagnosis not present

## 2020-07-29 ENCOUNTER — Other Ambulatory Visit: Payer: Self-pay | Admitting: Family Medicine

## 2020-08-04 MED ORDER — BUPROPION HCL ER (XL) 300 MG PO TB24: 300.0000 mg | ORAL_TABLET | Freq: Every morning | ORAL | 1 refills | Status: DC

## 2020-08-08 ENCOUNTER — Ambulatory Visit: Payer: BC Managed Care – PPO | Admitting: Family Medicine

## 2020-08-08 NOTE — Progress Notes (Deleted)
[name] [address] [place] [address] Phone: [phone] Subjective:    I'm seeing this patient by the request  of:  Wynn Banker, MD  CC: low back pain   [id number]  Ashley Allen is a 49 y.o. female coming in with complaint of ***  Onset-  Location Duration-  Character- Aggravating factors- Reliving factors-  Therapies tried-  Severity-   xrays of the back in 2018 were negative   Past Medical History:  Diagnosis Date  . Anxiety   . Chicken pox   . Depression   . GERD (gastroesophageal reflux disease)   . Hx of melanoma of skin 06/20/2017   Sees dermatologist  . Sleep apnea   . Thyroid nodule    benign  . Ulcer   . UTI (urinary tract infection)    Past Surgical History:  Procedure Laterality Date  . BREAST ENHANCEMENT SURGERY  2007  . NASAL SINUS SURGERY  ?2010   Social History   Socioeconomic History  . Marital status: Single    Spouse name: Not on file  . Number of children: 0  . Years of education: 70  . Highest education level: Not on file  Occupational History  . Occupation: Investment banker, corporate: [employer]  Tobacco Use  . Smoking status: Former Smoker    Years: 3.00    Types: Cigarettes    Quit date: 09/24/2009    Years since quitting: 10.8  . Smokeless tobacco: Never Used  . Tobacco comment: previous social smoker   Substance and Sexual Activity  . Alcohol use: Yes  . Drug use: No  . Sexual activity: Not on file  Other Topics Concern  . Not on file  Social History Narrative   Regular exercise-yes   Caffeine Use-yes   Social Determinants of Health   Financial Resource Strain:   . Difficulty of Paying Living Expenses: Not on file  Food Insecurity:   . Worried About Programme researcher, broadcasting/film/video in the Last Year: Not on file  . Ran Out of Food in the Last Year: Not on file  Transportation Needs:   . Lack of Transportation (Medical): Not on file  . Lack of Transportation (Non-Medical): Not on  file  Physical Activity:   . Days of Exercise per Week: Not on file  . Minutes of Exercise per Session: Not on file  Stress:   . Feeling of Stress : Not on file  Social Connections:   . Frequency of Communication with Friends and Family: Not on file  . Frequency of Social Gatherings with Friends and Family: Not on file  . Attends Religious Services: Not on file  . Active Member of Clubs or Organizations: Not on file  . Attends Banker Meetings: Not on file  . Marital Status: Not on file   Allergies  Allergen Reactions  . Flexeril [Cyclobenzaprine] Itching  . Polysporin [Bacitracin-Polymyxin B]   . Sulfonamide Derivatives     REACTION: Itching   Family History  Problem Relation Age of Onset  . Alcohol abuse Father   . Arthritis Father   . Heart disease Father   . Hypertension Father   . Emphysema Father        heavy smoker  . Arthritis Mother   . Hyperlipidemia Mother   . Hypertension Mother   . Uterine cancer Other   . Colon cancer Other   . Heart attack Paternal Grandmother  while pregnant  . Cancer Other        Aunts  . Colon cancer Paternal Aunt   . Stomach cancer Maternal Aunt        spread to colon   . Cervical cancer Paternal Aunt   . High blood pressure Sister   . High Cholesterol Sister   . High blood pressure Brother   . Alcohol abuse Brother   . Stroke Maternal Grandmother 60  . Heart attack Maternal Grandfather 50  . Liver cancer Maternal Aunt   . High blood pressure Sister   . High Cholesterol Sister   . High blood pressure Brother   . Obesity Brother   . High blood pressure Brother   . Stroke Paternal Uncle 42  . Stroke Paternal Uncle 2    Current Outpatient Medications (Endocrine & Metabolic):  .  levonorgestrel (MIRENA) 20 MCG/24HR IUD, 1 each by Intrauterine route once.      Current Outpatient Medications (Other):  Marland Kitchen  buPROPion (WELLBUTRIN XL) 300 MG 24 hr tablet, Take 1 tablet (300 mg total) by mouth every  morning. .  Cholecalciferol (VITAMIN D3) 25 MCG (1000 UT) CAPS, Take by mouth. .  NON FORMULARY, Inflavonoid Intensive Care .  valACYclovir (VALTREX) 500 MG tablet, TAKE ONE TABLET BY MOUTH TWICE DAILY X 3 DAYS ONSET SYMPTOMS   Reviewed prior external information including notes and imaging from  primary care provider As well as notes that were available from care everywhere and other healthcare systems.  Past medical history, social, surgical and family history all reviewed in electronic medical record.  No pertanent information unless stated regarding to the chief complaint.   Review of Systems:  No headache, visual changes, nausea, vomiting, diarrhea, constipation, dizziness, abdominal pain, skin rash, fevers, chills, night sweats, weight loss, swollen lymph nodes, body aches, joint swelling, chest pain, shortness of breath, mood changes. POSITIVE muscle aches  Objective  There were no vitals taken for this visit.   General: No apparent distress alert and oriented x3 mood and affect normal, dressed appropriately.  HEENT: Pupils equal, extraocular movements intact  Respiratory: Patient's speak in full sentences and does not appear short of breath  Cardiovascular: No lower extremity edema, non tender, no erythema  Neuro: Cranial nerves II through XII are intact, neurovascularly intact in all extremities with 2+ DTRs and 2+ pulses.  Gait normal with good balance and coordination.  MSK:  Non tender with full range of motion and good stability and symmetric strength and tone of shoulders, elbows, wrist, hip, knee and ankles bilaterally.     Impression and Recommendations:     The above documentation has been reviewed and is accurate and complete Judi Saa, DO

## 2020-08-11 ENCOUNTER — Ambulatory Visit: Payer: BC Managed Care – PPO | Admitting: Family Medicine

## 2020-08-11 ENCOUNTER — Other Ambulatory Visit: Payer: Self-pay

## 2020-08-11 ENCOUNTER — Encounter: Payer: Self-pay | Admitting: Family Medicine

## 2020-08-11 DIAGNOSIS — M999 Biomechanical lesion, unspecified: Secondary | ICD-10-CM

## 2020-08-11 DIAGNOSIS — M533 Sacrococcygeal disorders, not elsewhere classified: Secondary | ICD-10-CM | POA: Diagnosis not present

## 2020-08-11 MED ORDER — PREDNISONE 20 MG PO TABS: 40.0000 mg | ORAL_TABLET | Freq: Two times a day (BID) | ORAL | 0 refills | Status: DC
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
## 2020-08-11 NOTE — Assessment & Plan Note (Signed)

## 2020-08-11 NOTE — Patient Instructions (Addendum)
Good to see you SI joint exercises Prednisone 40 mg daily for 5 days starting tomorrow No antiinflamatories with it Ice  Ok to work with trainer See me again in 4-5 weeks

## 2020-08-11 NOTE — Assessment & Plan Note (Signed)
Chronic problem with exacerbation.  Has been nearly 3 years.  Had responded previously to manipulation.  We discussed different medications and given prednisone secondary to the severity.  No radicular symptoms at this time no.  Patient did have some improvement after the manipulation almost immediately and given home exercises.  Follow-up with me again in 4 to 6 weeks if continuing to have trouble repeat x-rays

## 2020-08-23 DIAGNOSIS — S338XXA Sprain of other parts of lumbar spine and pelvis, initial encounter: Secondary | ICD-10-CM | POA: Diagnosis not present

## 2020-08-23 DIAGNOSIS — S233XXA Sprain of ligaments of thoracic spine, initial encounter: Secondary | ICD-10-CM | POA: Diagnosis not present

## 2020-08-23 DIAGNOSIS — S134XXA Sprain of ligaments of cervical spine, initial encounter: Secondary | ICD-10-CM | POA: Diagnosis not present

## 2020-08-26 DIAGNOSIS — J019 Acute sinusitis, unspecified: Secondary | ICD-10-CM | POA: Diagnosis not present

## 2020-08-26 DIAGNOSIS — J029 Acute pharyngitis, unspecified: Secondary | ICD-10-CM | POA: Diagnosis not present

## 2020-08-26 DIAGNOSIS — R051 Acute cough: Secondary | ICD-10-CM | POA: Diagnosis not present

## 2020-08-26 DIAGNOSIS — Z20822 Contact with and (suspected) exposure to covid-19: Secondary | ICD-10-CM | POA: Diagnosis not present

## 2020-09-07 DIAGNOSIS — J029 Acute pharyngitis, unspecified: Secondary | ICD-10-CM | POA: Diagnosis not present

## 2020-09-07 DIAGNOSIS — J209 Acute bronchitis, unspecified: Secondary | ICD-10-CM | POA: Diagnosis not present

## 2020-09-07 DIAGNOSIS — R0600 Dyspnea, unspecified: Secondary | ICD-10-CM | POA: Diagnosis not present

## 2020-09-07 DIAGNOSIS — J019 Acute sinusitis, unspecified: Secondary | ICD-10-CM | POA: Diagnosis not present

## 2020-09-07 DIAGNOSIS — R0602 Shortness of breath: Secondary | ICD-10-CM | POA: Diagnosis not present

## 2020-09-07 DIAGNOSIS — R059 Cough, unspecified: Secondary | ICD-10-CM | POA: Diagnosis not present

## 2020-09-14 ENCOUNTER — Ambulatory Visit: Payer: BC Managed Care – PPO | Admitting: Family Medicine

## 2020-09-14 ENCOUNTER — Encounter: Payer: Self-pay | Admitting: Family Medicine

## 2020-09-14 ENCOUNTER — Other Ambulatory Visit: Payer: Self-pay

## 2020-09-14 ENCOUNTER — Ambulatory Visit (INDEPENDENT_AMBULATORY_CARE_PROVIDER_SITE_OTHER): Payer: BC Managed Care – PPO

## 2020-09-14 VITALS — BP 120/82 | HR 99 | Ht 64.0 in | Wt 167.0 lb

## 2020-09-14 DIAGNOSIS — M545 Low back pain, unspecified: Secondary | ICD-10-CM | POA: Diagnosis not present

## 2020-09-14 DIAGNOSIS — M5416 Radiculopathy, lumbar region: Secondary | ICD-10-CM | POA: Diagnosis not present

## 2020-09-14 MED ORDER — PREDNISONE 50 MG PO TABS: ORAL_TABLET | ORAL | 0 refills | Status: DC

## 2020-09-14 MED ORDER — GABAPENTIN 100 MG PO CAPS: 200.0000 mg | ORAL_CAPSULE | Freq: Every day | ORAL | 0 refills | Status: DC

## 2020-09-14 NOTE — Patient Instructions (Signed)
Prednisone for next 5 days Gabapentin 200mg  at night Continue exercises See me in one month

## 2020-09-14 NOTE — Assessment & Plan Note (Addendum)
Right-sided intermittent lumbar radiculopathy.  Patient did have a seems to be a sinus infection with a bronchitis.  Patient is done with the antibiotics x-rays were ordered today.  Discussed with patient about if anything does seem to get worse advanced imaging will be warranted.  Patient will be following up with me again in 3 weeks.

## 2020-09-14 NOTE — Progress Notes (Signed)
[name] [address] [place] [address] Phone: [phone] Subjective:   Ashley Allen, am serving as a scribe for Dr. Antoine Primas. This visit occurred during the SARS-CoV-2 public health emergency.  Safety protocols were in place, including screening questions prior to the visit, additional usage of staff PPE, and extensive cleaning of exam room while observing appropriate contact time as indicated for disinfecting solutions.   I'm seeing this patient by the request  of:  Koberlein, Paris Lore, MD  CC: Low back pain follow-up  [id number]  Ashley Allen is a 49 y.o. female coming in with complaint of back and neck pain. OMT 08/11/2020. 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 States that her back pain increased since yesterday when she drove her truck. Standing also increases her pain. Not using any medication for pain. Did have some relief from OMT last visit.  Patient though is having more of the radicular symptoms.  Did have a lot of coughing last time.  States that it does feel to be more nerve related.      Xrays 2018 of lumbar and right hip  were normal    Reviewed prior external information including notes and imaging from previsou exam, outside providers and external EMR if available.   As well as notes that were available from care everywhere and other healthcare systems.  Past medical history, social, surgical and family history all reviewed in electronic medical record.  No pertanent information unless stated regarding to the chief complaint.   Past Medical History:  Diagnosis Date  . Anxiety   . Chicken pox   . Depression   . GERD (gastroesophageal reflux disease)   . Hx of melanoma of skin 06/20/2017   Sees dermatologist  . Sleep apnea   . Thyroid nodule    benign  . Ulcer   . UTI (urinary tract infection)     Allergies  Allergen Reactions  . Flexeril  [Cyclobenzaprine] Itching  . Polysporin [Bacitracin-Polymyxin B]   . Sulfonamide Derivatives     REACTION: Itching     Review of Systems:  No headache, visual changes, nausea, vomiting, diarrhea, constipation, dizziness, abdominal pain, skin rash, fevers, chills, night sweats, weight loss, swollen lymph nodes, body aches, joint swelling, chest pain, shortness of breath, mood changes. POSITIVE muscle aches  Objective  Blood pressure 120/82, pulse 99, height 5\' 4"  (1.626 m), weight 167 lb (75.8 kg), SpO2 98 %.   General: No apparent distress alert and oriented x3 mood and affect normal, dressed appropriately.  HEENT: Pupils equal, extraocular movements intact  Respiratory: Patient's speak in full sentences and does not appear short of breath  Cardiovascular: No lower extremity edema, non tender, no erythema  Neuro: Cranial nerves II through XII are intact, neurovascularly intact in all extremities with 2+ DTRs and 2+ pulses.  Gait normal with good balance and coordination.  MSK:  Non tender with full range of motion and good stability and symmetric strength and tone of shoulders, elbows, wrist, hip, knee and ankles bilaterally.  Back -low back exam does show that patient does have tightness with straight leg test.  Patient does have pain noted more on the sacroiliac area right greater than left.  Mild positive .  Neurovascular intact distally and does seem to have 5 out of 5 strength of the lower extremity        Assessment and Plan:  The above documentation has been reviewed and is accurate and complete Lyndal Pulley, DO       Note: This dictation was prepared with Dragon dictation along with smaller phrase technology. Any transcriptional errors that result from this process are unintentional.

## 2020-09-21 ENCOUNTER — Ambulatory Visit: Payer: BC Managed Care – PPO | Admitting: Family Medicine

## 2020-10-12 NOTE — Progress Notes (Deleted)
[name] [address] [place] [address] Phone: [phone] Subjective:    I'm seeing this patient by the request  of:  Wynn Banker, MD  CC:   [id number]   09/14/2020 Right-sided intermittent lumbar radiculopathy.  Patient did have a seems to be a sinus infection with a bronchitis.  Patient is done with the antibiotics x-rays were ordered today.  Discussed with patient about if anything does seem to get worse advanced imaging will be warranted.  Patient will be following up with me again in 3 weeks.  Update 10/13/2020 Jalyn Holsapple is a 50 y.o. female coming in with complaint of low back pain. Patient states   Onset-  Location Duration-  Character- Aggravating factors- Reliving factors-  Therapies tried-  Severity- Was started on prednisone and gabapentin at last visit   New xray of lumbar 12/22- normal  Patient was to write if not better and we would have consider MRI.   Past Medical History:  Diagnosis Date  . Anxiety   . Chicken pox   . Depression   . GERD (gastroesophageal reflux disease)   . Hx of melanoma of skin 06/20/2017   Sees dermatologist  . Sleep apnea   . Thyroid nodule    benign  . Ulcer   . UTI (urinary tract infection)    Past Surgical History:  Procedure Laterality Date  . BREAST ENHANCEMENT SURGERY  2007  . NASAL SINUS SURGERY  ?2010   Social History   Socioeconomic History  . Marital status: Single    Spouse name: Not on file  . Number of children: 0  . Years of education: 60  . Highest education level: Not on file  Occupational History  . Occupation: Investment banker, corporate: [employer]  Tobacco Use  . Smoking status: Former Smoker    Years: 3.00    Types: Cigarettes    Quit date: 09/24/2009    Years since quitting: 11.0  . Smokeless tobacco: Never Used  . Tobacco comment: previous social smoker   Substance and Sexual Activity  . Alcohol use: Yes  . Drug use: No  . Sexual activity: Not  on file  Other Topics Concern  . Not on file  Social History Narrative   Regular exercise-yes   Caffeine Use-yes   Social Determinants of Health   Financial Resource Strain: Not on file  Food Insecurity: Not on file  Transportation Needs: Not on file  Physical Activity: Not on file  Stress: Not on file  Social Connections: Not on file   Allergies  Allergen Reactions  . Flexeril [Cyclobenzaprine] Itching  . Polysporin [Bacitracin-Polymyxin B]   . Sulfonamide Derivatives     REACTION: Itching   Family History  Problem Relation Age of Onset  . Alcohol abuse Father   . Arthritis Father   . Heart disease Father   . Hypertension Father   . Emphysema Father        heavy smoker  . Arthritis Mother   . Hyperlipidemia Mother   . Hypertension Mother   . Uterine cancer Other   . Colon cancer Other   . Heart attack Paternal Grandmother        while pregnant  . Cancer Other        Aunts  . Colon cancer Paternal Aunt   . Stomach cancer Maternal Aunt        spread to colon   . Cervical cancer Paternal Aunt   .  High blood pressure Sister   . High Cholesterol Sister   . High blood pressure Brother   . Alcohol abuse Brother   . Stroke Maternal Grandmother 67  . Heart attack Maternal Grandfather 50  . Liver cancer Maternal Aunt   . High blood pressure Sister   . High Cholesterol Sister   . High blood pressure Brother   . Obesity Brother   . High blood pressure Brother   . Stroke Paternal Uncle 41  . Stroke Paternal Uncle 73    Current Outpatient Medications (Endocrine & Metabolic):  .  levonorgestrel (MIRENA) 20 MCG/24HR IUD, 1 each by Intrauterine route once. .  predniSONE (DELTASONE) 20 MG tablet, Take 2 tablets (40 mg total) by mouth 2 (two) times daily. .  predniSONE (DELTASONE) 50 MG tablet, Take one tablet daily for the next 5 days.      Current Outpatient Medications (Other):  Marland Kitchen  buPROPion (WELLBUTRIN XL) 300 MG 24 hr tablet, Take 1 tablet (300 mg total) by  mouth every morning. .  Cholecalciferol (VITAMIN D3) 25 MCG (1000 UT) CAPS, Take by mouth. .  gabapentin (NEURONTIN) 100 MG capsule, Take 2 capsules (200 mg total) by mouth at bedtime. .  NON FORMULARY, Inflavonoid Intensive Care .  valACYclovir (VALTREX) 500 MG tablet, TAKE ONE TABLET BY MOUTH TWICE DAILY X 3 DAYS ONSET SYMPTOMS   Reviewed prior external information including notes and imaging from  primary care provider As well as notes that were available from care everywhere and other healthcare systems.  Past medical history, social, surgical and family history all reviewed in electronic medical record.  No pertanent information unless stated regarding to the chief complaint.   Review of Systems:  No headache, visual changes, nausea, vomiting, diarrhea, constipation, dizziness, abdominal pain, skin rash, fevers, chills, night sweats, weight loss, swollen lymph nodes, body aches, joint swelling, chest pain, shortness of breath, mood changes. POSITIVE muscle aches  Objective  There were no vitals taken for this visit.   General: No apparent distress alert and oriented x3 mood and affect normal, dressed appropriately.  HEENT: Pupils equal, extraocular movements intact  Respiratory: Patient's speak in full sentences and does not appear short of breath  Cardiovascular: No lower extremity edema, non tender, no erythema  Gait normal with good balance and coordination.  MSK:  Non tender with full range of motion and good stability and symmetric strength and tone of shoulders, elbows, wrist, hip, knee and ankles bilaterally.     Impression and Recommendations:     The above documentation has been reviewed and is accurate and complete Judi Saa, DO

## 2020-10-13 ENCOUNTER — Ambulatory Visit: Payer: BC Managed Care – PPO | Admitting: Family Medicine

## 2020-10-16 DIAGNOSIS — Z87891 Personal history of nicotine dependence: Secondary | ICD-10-CM | POA: Diagnosis not present

## 2020-10-16 DIAGNOSIS — M25522 Pain in left elbow: Secondary | ICD-10-CM | POA: Diagnosis not present

## 2020-10-16 DIAGNOSIS — W009XXA Unspecified fall due to ice and snow, initial encounter: Secondary | ICD-10-CM | POA: Diagnosis not present

## 2020-10-16 DIAGNOSIS — S42402A Unspecified fracture of lower end of left humerus, initial encounter for closed fracture: Secondary | ICD-10-CM | POA: Diagnosis not present

## 2020-10-16 DIAGNOSIS — S42442A Displaced fracture (avulsion) of medial epicondyle of left humerus, initial encounter for closed fracture: Secondary | ICD-10-CM | POA: Diagnosis not present

## 2020-10-17 DIAGNOSIS — M25522 Pain in left elbow: Secondary | ICD-10-CM | POA: Diagnosis not present

## 2020-10-17 DIAGNOSIS — S42402A Unspecified fracture of lower end of left humerus, initial encounter for closed fracture: Secondary | ICD-10-CM | POA: Diagnosis not present

## 2020-10-17 DIAGNOSIS — Z6830 Body mass index (BMI) 30.0-30.9, adult: Secondary | ICD-10-CM | POA: Diagnosis not present

## 2020-10-21 DIAGNOSIS — M25422 Effusion, left elbow: Secondary | ICD-10-CM | POA: Diagnosis not present

## 2020-10-21 DIAGNOSIS — X58XXXA Exposure to other specified factors, initial encounter: Secondary | ICD-10-CM | POA: Diagnosis not present

## 2020-10-21 DIAGNOSIS — S42402A Unspecified fracture of lower end of left humerus, initial encounter for closed fracture: Secondary | ICD-10-CM | POA: Diagnosis not present

## 2020-10-21 DIAGNOSIS — S42442A Displaced fracture (avulsion) of medial epicondyle of left humerus, initial encounter for closed fracture: Secondary | ICD-10-CM | POA: Diagnosis not present

## 2020-10-24 DIAGNOSIS — S42402A Unspecified fracture of lower end of left humerus, initial encounter for closed fracture: Secondary | ICD-10-CM | POA: Diagnosis not present

## 2020-10-24 DIAGNOSIS — Z6831 Body mass index (BMI) 31.0-31.9, adult: Secondary | ICD-10-CM | POA: Diagnosis not present

## 2020-10-25 ENCOUNTER — Ambulatory Visit: Payer: BC Managed Care – PPO | Admitting: Family Medicine

## 2020-10-25 DIAGNOSIS — M778 Other enthesopathies, not elsewhere classified: Secondary | ICD-10-CM | POA: Diagnosis not present

## 2020-10-25 DIAGNOSIS — M25422 Effusion, left elbow: Secondary | ICD-10-CM | POA: Diagnosis not present

## 2020-10-25 DIAGNOSIS — S42442A Displaced fracture (avulsion) of medial epicondyle of left humerus, initial encounter for closed fracture: Secondary | ICD-10-CM | POA: Diagnosis not present

## 2020-10-25 DIAGNOSIS — W19XXXA Unspecified fall, initial encounter: Secondary | ICD-10-CM | POA: Diagnosis not present

## 2020-10-25 DIAGNOSIS — M7989 Other specified soft tissue disorders: Secondary | ICD-10-CM | POA: Diagnosis not present

## 2020-10-25 DIAGNOSIS — S42452A Displaced fracture of lateral condyle of left humerus, initial encounter for closed fracture: Secondary | ICD-10-CM | POA: Diagnosis not present

## 2020-10-25 DIAGNOSIS — S42402A Unspecified fracture of lower end of left humerus, initial encounter for closed fracture: Secondary | ICD-10-CM | POA: Diagnosis not present

## 2020-10-27 ENCOUNTER — Other Ambulatory Visit: Payer: Self-pay | Admitting: Family Medicine

## 2020-10-27 DIAGNOSIS — S42402A Unspecified fracture of lower end of left humerus, initial encounter for closed fracture: Secondary | ICD-10-CM | POA: Diagnosis not present

## 2020-10-27 DIAGNOSIS — Z6831 Body mass index (BMI) 31.0-31.9, adult: Secondary | ICD-10-CM | POA: Diagnosis not present

## 2020-11-01 DIAGNOSIS — S42402A Unspecified fracture of lower end of left humerus, initial encounter for closed fracture: Secondary | ICD-10-CM | POA: Diagnosis not present

## 2020-11-04 DIAGNOSIS — S42402A Unspecified fracture of lower end of left humerus, initial encounter for closed fracture: Secondary | ICD-10-CM | POA: Diagnosis not present

## 2020-11-08 DIAGNOSIS — S42402A Unspecified fracture of lower end of left humerus, initial encounter for closed fracture: Secondary | ICD-10-CM | POA: Diagnosis not present

## 2020-11-11 DIAGNOSIS — S42402A Unspecified fracture of lower end of left humerus, initial encounter for closed fracture: Secondary | ICD-10-CM | POA: Diagnosis not present

## 2020-11-14 DIAGNOSIS — S42402A Unspecified fracture of lower end of left humerus, initial encounter for closed fracture: Secondary | ICD-10-CM | POA: Diagnosis not present

## 2020-11-18 DIAGNOSIS — S42402A Unspecified fracture of lower end of left humerus, initial encounter for closed fracture: Secondary | ICD-10-CM | POA: Diagnosis not present

## 2020-11-21 DIAGNOSIS — S42402A Unspecified fracture of lower end of left humerus, initial encounter for closed fracture: Secondary | ICD-10-CM | POA: Diagnosis not present

## 2020-11-23 DIAGNOSIS — S42402A Unspecified fracture of lower end of left humerus, initial encounter for closed fracture: Secondary | ICD-10-CM | POA: Diagnosis not present

## 2020-11-24 DIAGNOSIS — Z6831 Body mass index (BMI) 31.0-31.9, adult: Secondary | ICD-10-CM | POA: Diagnosis not present

## 2020-11-24 DIAGNOSIS — S42402A Unspecified fracture of lower end of left humerus, initial encounter for closed fracture: Secondary | ICD-10-CM | POA: Diagnosis not present

## 2020-11-30 DIAGNOSIS — L570 Actinic keratosis: Secondary | ICD-10-CM | POA: Diagnosis not present

## 2020-11-30 DIAGNOSIS — Z85828 Personal history of other malignant neoplasm of skin: Secondary | ICD-10-CM | POA: Diagnosis not present

## 2020-11-30 DIAGNOSIS — Z85820 Personal history of malignant melanoma of skin: Secondary | ICD-10-CM | POA: Diagnosis not present

## 2020-11-30 DIAGNOSIS — L0293 Carbuncle, unspecified: Secondary | ICD-10-CM | POA: Diagnosis not present

## 2020-12-02 DIAGNOSIS — S42402A Unspecified fracture of lower end of left humerus, initial encounter for closed fracture: Secondary | ICD-10-CM | POA: Diagnosis not present

## 2020-12-02 DIAGNOSIS — X58XXXA Exposure to other specified factors, initial encounter: Secondary | ICD-10-CM | POA: Diagnosis not present

## 2020-12-30 ENCOUNTER — Ambulatory Visit (INDEPENDENT_AMBULATORY_CARE_PROVIDER_SITE_OTHER): Payer: BC Managed Care – PPO | Admitting: Psychology

## 2020-12-30 DIAGNOSIS — F4323 Adjustment disorder with mixed anxiety and depressed mood: Secondary | ICD-10-CM

## 2021-01-22 ENCOUNTER — Other Ambulatory Visit: Payer: Self-pay | Admitting: Family Medicine

## 2021-01-24 ENCOUNTER — Other Ambulatory Visit: Payer: Self-pay | Admitting: Family Medicine

## 2021-01-27 DIAGNOSIS — S134XXA Sprain of ligaments of cervical spine, initial encounter: Secondary | ICD-10-CM | POA: Diagnosis not present

## 2021-01-27 DIAGNOSIS — S338XXA Sprain of other parts of lumbar spine and pelvis, initial encounter: Secondary | ICD-10-CM | POA: Diagnosis not present

## 2021-01-27 DIAGNOSIS — S233XXA Sprain of ligaments of thoracic spine, initial encounter: Secondary | ICD-10-CM | POA: Diagnosis not present

## 2021-01-30 DIAGNOSIS — M546 Pain in thoracic spine: Secondary | ICD-10-CM | POA: Diagnosis not present

## 2021-01-30 DIAGNOSIS — S39012A Strain of muscle, fascia and tendon of lower back, initial encounter: Secondary | ICD-10-CM | POA: Diagnosis not present

## 2021-02-07 NOTE — Progress Notes (Signed)
   I, Philbert Riser, LAT, ATC acting as a scribe for Clementeen Graham, MD.  Subjective:    CC: Thoracic back pain  HPI: Pt is a 50 y/o female c/o mid-back pain ongoing for 2.5 weeks. MOI: Pt was in her car and went to put something in the passenger and felt a sharp pain. Pt was previously seen by Dr. Katrinka Blazing on 09/14/20 for LBP. Pt was seen at [facility] on 01/30/21 and was prescribed diclofenac 75mg  and metaxalone 800mg . Pt locates pain to L-side thoracic spine.   Radiates: no Numbness/tingling: no- when waking up L hand is sometimes "asleep" Weakness: no Aggravates: trunk rotation, side-lying, exercise, crunches Treatments tried: ice, massage, chiro  Dx imaging: 09/14/20 L-spine XR  Pertinent review of Systems: No fevers or chills  Relevant historical information: Sleep apnea   Objective:    Vitals:   02/08/21 1302  BP: 124/90  Pulse: 80  SpO2: 99%   General: Well Developed, well nourished, and in no acute distress.   MSK: Normal-appearing Nontender midline. Mildly tender palpation left lumbar paraspinal musculature. Not particularly tender in thoracic area. Normal lumbar motion however some pain with rotation and lateral flexion. Lower extremity strength is intact. No pain with arm motion.  Lab and Radiology Results EXAM: LUMBAR SPINE - 2-3 VIEW  COMPARISON:  Lumbar spine radiographs 05/11/2017  FINDINGS: There are 5 lumbar type vertebral bodies. The alignment is normal. The disc spaces are preserved. No evidence of acute fracture or pars defect. No significant facet arthropathy. Intrauterine device noted.  IMPRESSION: Normal lumbar spine radiographs.   Electronically Signed   By: 02/10/21 M.D.   On: 09/14/2020 16:57  I, Carey Bullocks, personally (independently) visualized and performed the interpretation of the images attached in this note.    Impression and Recommendations:    Assessment and Plan: 50 y.o. female with left flank  pain.  Pain is located in the left upper low back area.  Thought to be muscle spasm and dysfunction probably of the quadratus lumborum muscle.  Plan for PT.  Additionally continue diclofenac and metaxalone prescribed by urgent care.  Recommend also using heating pad and TENS unit.  Recheck in 6 weeks.Clementeen Graham  PDMP not reviewed this encounter. Orders Placed This Encounter  Procedures  . Ambulatory referral to Physical Therapy    Referral Priority:   Routine    Referral Type:   Physical Medicine    Referral Reason:   Specialty Services Required    Requested Specialty:   Physical Therapy    Number of Visits Requested:   1   No orders of the defined types were placed in this encounter.   Discussed warning signs or symptoms. Please see discharge instructions. Patient expresses understanding.   The above documentation has been reviewed and is accurate and complete 52, M.D.

## 2021-02-08 ENCOUNTER — Other Ambulatory Visit: Payer: Self-pay

## 2021-02-08 ENCOUNTER — Ambulatory Visit: Payer: BC Managed Care – PPO | Admitting: Family Medicine

## 2021-02-08 ENCOUNTER — Telehealth: Payer: Self-pay | Admitting: Family Medicine

## 2021-02-08 VITALS — BP 124/90 | HR 80 | Ht 64.0 in | Wt 157.4 lb

## 2021-02-08 DIAGNOSIS — R109 Unspecified abdominal pain: Secondary | ICD-10-CM | POA: Diagnosis not present

## 2021-02-08 NOTE — Telephone Encounter (Signed)
[facility] called stating they received the order for the patient for physical therapy but they are needing one with diagnosis codes. Could this be faxed to them at [phone]?

## 2021-02-08 NOTE — Patient Instructions (Signed)
Thank you for coming in today.  I've referred you to Physical Therapy.  Let us know if you don't hear from them in one week.  Try heat and TENS unit.  Ok to advance activity as tolerated.   Recheck with Dr Katrinka Blazing or my self in about 6 weeks especially if not improved.   Avoid activity that makes you a lot worse.   TENS UNIT: This is helpful for muscle pain and spasm.   Search and Purchase a TENS 7000 2nd edition at  www.tenspros.com or www.Amazon.com It should be less than $30.     TENS unit instructions: Do not shower or bathe with the unit on . Turn the unit off before removing electrodes or batteries . If the electrodes lose stickiness add a drop of water to the electrodes after they are disconnected from the unit and place on plastic sheet. If you continued to have difficulty, call the TENS unit company to purchase more electrodes. . Do not apply lotion on the skin area prior to use. Make sure the skin is clean and dry as this will help prolong the life of the electrodes. . After use, always check skin for unusual red areas, rash or other skin difficulties. If there are any skin problems, does not apply electrodes to the same area. . Never remove the electrodes from the unit by pulling the wires. . Do not use the TENS unit or electrodes other than as directed. . Do not change electrode placement without consultating your therapist or physician. Marland Kitchen Keep 2 fingers with between each electrode. . Wear time ratio is 2:1, on to off times.    For example on for 30 minutes off for 15 minutes and then on for 30 minutes off for 15 minutes    Lumbosacral Strain Lumbosacral strain is an injury that causes pain in the lower back (lumbosacral spine). This injury usually happens from overstretching the muscles or ligaments along your spine. Ligaments are cord-like tissues that connect bones to other bones. A strain can affect one or more muscles or ligaments. What are the causes? This  condition may be caused by:  A hard, direct hit to the back.  Overstretching the lower back muscles. This may result from: ? A fall. ? Lifting something heavy. ? Repetitive movements such as bending or crouching. What increases the risk? The following factors may make you more likely to develop this condition:  Participating in sports or activities that involve: ? A sudden twist of the back. ? Pushing or pulling motions.  Being overweight or obese.  Having poor strength and flexibility, especially tight hamstrings or weak muscles in the back or abdomen.  Having too much of a curve in the lower back.  Having a pelvis that is tilted forward. What are the signs or symptoms? The main symptom of this condition is pain in the lower back, at the site of the strain. Pain may also be felt down one or both legs. How is this diagnosed? This condition is diagnosed based on your symptoms, your medical history, and a physical exam. During the physical exam, your health care provider may push on certain areas of your back to find the source of your pain. You may be asked to bend forward, backward, and side to side to check your pain and range of motion. You may also have imaging tests, such as X-rays and an MRI. How is this treated? This condition may be treated by:  Applying heat and cold on  the affected area.  Taking medicines to help relieve pain and relax your muscles.  Taking NSAIDs, such as ibuprofen, to help reduce swelling and discomfort.  Doing stretching and strengthening exercises for your lower back. Symptoms usually improve within several weeks of treatment. However, recovery time varies. When your symptoms improve, gradually return to your normal routine as soon as possible to reduce pain, avoid stiffness, and keep muscle strength. Follow these instructions at home: Medicines  Take over-the-counter and prescription medicines only as told by your health care provider.  Ask  your health care provider if the medicine prescribed to you: ? Requires you to avoid driving or using heavy machinery. ? Can cause constipation. You may need to take these actions to prevent or treat constipation:  Drink enough fluid to keep your urine pale yellow.  Take over-the-counter or prescription medicines.  Eat foods that are high in fiber, such as beans, whole grains, and fresh fruits and vegetables.  Limit foods that are high in fat and processed sugars, such as fried or sweet foods. Managing pain, stiffness, and swelling  If directed, put ice on the injured area. To do this: ? Put ice in a plastic bag. ? Place a towel between your skin and the bag. ? Leave the ice on for 20 minutes, 2-3 times a day.  If directed, apply heat on the affected area as often as told by your health care provider. Use the heat source that your health care provider recommends, such as a moist heat pack or a heating pad. ? Place a towel between your skin and the heat source. ? Leave the heat on for 20-30 minutes. ? Remove the heat if your skin turns bright red. This is especially important if you are unable to feel pain, heat, or cold. You may have a greater risk of getting burned.      Activity  Rest as told by your health care provider.  Do not stay in bed. Staying in bed for more than 1-2 days can delay your recovery.  Return to your normal activities as told by your health care provider. Ask your health care provider what activities are safe for you.  Avoid activities that take a lot of energy for as long as told by your health care provider.  Do exercises as told by your health care provider. This includes stretching and strengthening exercises. General instructions  Sit up and stand up straight. Avoid leaning forward when you sit, or hunching over when you stand.  Do not use any products that contain nicotine or tobacco, such as cigarettes, e-cigarettes, and chewing tobacco. If you need  help quitting, ask your health care provider.  Keep all follow-up visits as told by your health care provider. This is important. How is this prevented?  Use correct form when playing sports and lifting heavy objects.  Use good posture when sitting and standing.  Maintain a healthy weight.  Sleep on a mattress with medium firmness to support your back.  Do at least 150 minutes of moderate-intensity exercise each week, such as brisk walking or water aerobics. Try a form of exercise that takes stress off your back, such as swimming or stationary cycling.  Maintain physical fitness, including: ? Strength. ? Flexibility.   Contact a health care provider if:  Your back pain does not improve after several weeks of treatment.  Your symptoms get worse. Get help right away if:  Your back pain is severe.  You cannot stand or walk.  You have difficulty controlling when you urinate or when you have a bowel movement.  You feel nauseous or you vomit.  Your feet or legs get very cold, turn pale, or look blue.  You have numbness, tingling, weakness, or problems using your arms or legs.  You develop any of the following: ? Shortness of breath. ? Dizziness. ? Pain in your legs. ? Weakness in your buttocks or legs. Summary  Lumbosacral strain is an injury that causes pain in the lower back (lumbosacral spine).  This injury usually happens from overstretching the muscles or ligaments along your spine.  This condition may be caused by a direct hit to the lower back or by overstretching the lower back muscles.  Symptoms usually improve within several weeks of treatment. This information is not intended to replace advice given to you by your health care provider. Make sure you discuss any questions you have with your health care provider. Document Revised: 02/03/2019 Document Reviewed: 02/03/2019 Elsevier Patient Education  2021 [employer].

## 2021-02-09 NOTE — Telephone Encounter (Signed)
I have faxed this to number provided below

## 2021-02-15 DIAGNOSIS — R109 Unspecified abdominal pain: Secondary | ICD-10-CM | POA: Diagnosis not present

## 2021-03-22 ENCOUNTER — Ambulatory Visit: Payer: BC Managed Care – PPO | Admitting: Family Medicine

## 2021-04-17 ENCOUNTER — Telehealth: Payer: Self-pay | Admitting: Family Medicine

## 2021-04-17 NOTE — Telephone Encounter (Signed)
Spoke with the pt and she stated she complains of vaginal itching and burning.  Advised a visit is needed for evaluation prior to medications being sent in.  Appt scheduled for 7/26 with Dr [place] to arrive at 2:45pm.

## 2021-04-17 NOTE — Telephone Encounter (Signed)
fluconazole (DIFLUCAN) 150 MG tablet    [id number] - [place], [place] - [address] Phone:  [phone]  Fax:  [phone]

## 2021-04-18 ENCOUNTER — Ambulatory Visit: Payer: BC Managed Care – PPO | Admitting: Family Medicine

## 2021-04-25 ENCOUNTER — Other Ambulatory Visit: Payer: Self-pay | Admitting: Family Medicine

## 2021-05-14 DIAGNOSIS — B9789 Other viral agents as the cause of diseases classified elsewhere: Secondary | ICD-10-CM | POA: Diagnosis not present

## 2021-05-14 DIAGNOSIS — Z20822 Contact with and (suspected) exposure to covid-19: Secondary | ICD-10-CM | POA: Diagnosis not present

## 2021-05-14 DIAGNOSIS — J019 Acute sinusitis, unspecified: Secondary | ICD-10-CM | POA: Diagnosis not present

## 2021-06-10 ENCOUNTER — Other Ambulatory Visit: Payer: Self-pay | Admitting: Family Medicine

## 2021-06-19 ENCOUNTER — Encounter: Payer: Self-pay | Admitting: Family Medicine

## 2021-06-19 ENCOUNTER — Ambulatory Visit (INDEPENDENT_AMBULATORY_CARE_PROVIDER_SITE_OTHER): Payer: BC Managed Care – PPO | Admitting: Family Medicine

## 2021-06-19 ENCOUNTER — Other Ambulatory Visit: Payer: Self-pay

## 2021-06-19 VITALS — BP 130/90 | HR 75 | Temp 98.4°F | Ht 64.0 in | Wt 154.8 lb

## 2021-06-19 DIAGNOSIS — E538 Deficiency of other specified B group vitamins: Secondary | ICD-10-CM | POA: Diagnosis not present

## 2021-06-19 DIAGNOSIS — Z23 Encounter for immunization: Secondary | ICD-10-CM | POA: Diagnosis not present

## 2021-06-19 DIAGNOSIS — Z13220 Encounter for screening for lipoid disorders: Secondary | ICD-10-CM

## 2021-06-19 DIAGNOSIS — Z0000 Encounter for general adult medical examination without abnormal findings: Secondary | ICD-10-CM | POA: Diagnosis not present

## 2021-06-19 DIAGNOSIS — E559 Vitamin D deficiency, unspecified: Secondary | ICD-10-CM | POA: Diagnosis not present

## 2021-06-19 DIAGNOSIS — Z131 Encounter for screening for diabetes mellitus: Secondary | ICD-10-CM

## 2021-06-19 DIAGNOSIS — T7840XD Allergy, unspecified, subsequent encounter: Secondary | ICD-10-CM | POA: Diagnosis not present

## 2021-06-19 MED ORDER — FLUTICASONE PROPIONATE 50 MCG/ACT NA SUSP
2.0000 | Freq: Every day | NASAL | 6 refills | Status: DC
Start: 2021-06-19 — End: 2021-07-18

## 2021-06-19 NOTE — Progress Notes (Signed)
Cathleen Corti DOB: 28-Jan-1971 Encounter date: 06/19/2021  This is a 50 y.o. female who presents for complete physical   History of present illness/Additional concerns: Last visit with me was 1 year ago.  She has no worries today.   Follows regularly with GYN; last visit 2 years ago. Has IUD, but having some breakthrough bleeding and is random. Not heavy, just 2-3 days. Has had as frequent as q 3 weeks.  Depression/anxiety: mood is doing ok overall. Wellbutrin does ok. Sleeps well. Energy level ok some days.  Hypothyroid history/thyroid nodules: Follows with endocrinology History of melanoma: Follows with dermatology regularly, Dr. Martinique. Hyperlipidemia: Seasonal allergies: she is taking zyrtec now and using saline nose rinse.   Just uses gabapentin for sciatica when it flares up. Hasn't needed it lately.   History of colon polyps: last colonoscopy 09/2018 and due for repeat in 3 years. She will set up follow up visit.   Past Medical History:  Diagnosis Date   Anxiety    Chicken pox    Depression    GERD (gastroesophageal reflux disease)    Hx of melanoma of skin 06/20/2017   Sees dermatologist   Sleep apnea    Thyroid nodule    benign   Ulcer    UTI (urinary tract infection)    Past Surgical History:  Procedure Laterality Date   BREAST ENHANCEMENT SURGERY  2007   NASAL SINUS SURGERY  ?2010   Allergies  Allergen Reactions   Flexeril [Cyclobenzaprine] Itching   Polysporin [Bacitracin-Polymyxin B]    Sulfonamide Derivatives     REACTION: Itching   Current Meds  Medication Sig   buPROPion (WELLBUTRIN XL) 300 MG 24 hr tablet TAKE 1 TABLET BY MOUTH EVERY DAY IN THE MORNING   Cholecalciferol (VITAMIN D3) 25 MCG (1000 UT) CAPS Take 2,000 Units by mouth daily at 6 (six) AM.   fluticasone (FLONASE) 50 MCG/ACT nasal spray Place 2 sprays into both nostrils daily.   levonorgestrel (MIRENA) 20 MCG/24HR IUD 1 each by Intrauterine route once.   valACYclovir (VALTREX) 500 MG tablet  TAKE ONE TABLET BY MOUTH TWICE DAILY X 3 DAYS ONSET SYMPTOMS   Social History   Tobacco Use   Smoking status: Former    Years: 3.00    Types: Cigarettes    Quit date: 09/24/2009    Years since quitting: 11.7   Smokeless tobacco: Never   Tobacco comments:    previous social smoker   Substance Use Topics   Alcohol use: Yes   Family History  Problem Relation Age of Onset   Alcohol abuse Father    Arthritis Father    Heart disease Father    Hypertension Father    Emphysema Father        heavy smoker   Arthritis Mother    Hyperlipidemia Mother    Hypertension Mother    Uterine cancer Other    Colon cancer Other    Heart attack Paternal Grandmother        while pregnant   Cancer Other        Aunts   Colon cancer Paternal Aunt    Stomach cancer Maternal Aunt        spread to colon    Cervical cancer Paternal Aunt    High blood pressure Sister    High Cholesterol Sister    High blood pressure Brother    Alcohol abuse Brother    Stroke Maternal Grandmother 61   Heart attack Maternal Grandfather 50   Liver  cancer Maternal Aunt    High blood pressure Sister    High Cholesterol Sister    High blood pressure Brother    Obesity Brother    High blood pressure Brother    Stroke Paternal Uncle 40   Stroke Paternal Uncle 40     Review of Systems  Constitutional:  Negative for activity change, appetite change, chills, fatigue, fever and unexpected weight change.  HENT:  Negative for congestion, ear pain, hearing loss, sinus pressure, sinus pain, sore throat and trouble swallowing.   Eyes:  Negative for pain and visual disturbance.  Respiratory:  Negative for cough, chest tightness, shortness of breath and wheezing.   Cardiovascular:  Negative for chest pain, palpitations and leg swelling.  Gastrointestinal:  Negative for abdominal pain, blood in stool, constipation, diarrhea, nausea and vomiting.  Genitourinary:  Negative for difficulty urinating and menstrual problem.   Musculoskeletal:  Negative for arthralgias and back pain.  Skin:  Negative for rash.  Neurological:  Negative for dizziness, weakness, numbness and headaches.  Hematological:  Negative for adenopathy. Does not bruise/bleed easily.  Psychiatric/Behavioral:  Negative for sleep disturbance and suicidal ideas. The patient is not nervous/anxious.    CBC:  Lab Results  Component Value Date   WBC 6.2 06/21/2021   HGB 14.3 06/21/2021   HCT 43.4 06/21/2021   MCH 30.5 05/27/2020   MCHC 33.0 06/21/2021   RDW 13.4 06/21/2021   PLT 298.0 06/21/2021   MPV 10.2 05/27/2020   CMP: Lab Results  Component Value Date   NA 139 06/21/2021   K 4.0 06/21/2021   CL 106 06/21/2021   CO2 28 06/21/2021   GLUCOSE 96 06/21/2021   BUN 7 06/21/2021   CREATININE 0.58 06/21/2021   CREATININE 0.62 05/27/2020   GFRAA  05/12/2010    >60        The eGFR has been calculated using the MDRD equation. This calculation has not been validated in all clinical situations. eGFR's persistently <60 mL/min signify possible Chronic Kidney Disease.   CALCIUM 9.0 06/21/2021   PROT 6.9 06/21/2021   BILITOT 0.3 06/21/2021   ALKPHOS 69 06/21/2021   ALT 22 06/21/2021   AST 20 06/21/2021   LIPID: Lab Results  Component Value Date   CHOL 184 06/21/2021   TRIG 83.0 06/21/2021   HDL 50.80 06/21/2021   LDLCALC 117 (H) 06/21/2021   LDLCALC 95 05/27/2020    Objective:  BP 130/90 (BP Location: Left Arm, Patient Position: Sitting, Cuff Size: Normal)   Pulse 75   Temp 98.4 F (36.9 C) (Oral)   Ht 5' 4" (1.626 m)   Wt 154 lb 12.8 oz (70.2 kg)   SpO2 98%   BMI 26.57 kg/m   Weight: 154 lb 12.8 oz (70.2 kg)   BP Readings from Last 3 Encounters:  06/19/21 130/90  02/08/21 124/90  09/14/20 120/82   Wt Readings from Last 3 Encounters:  06/19/21 154 lb 12.8 oz (70.2 kg)  02/08/21 157 lb 6.4 oz (71.4 kg)  09/14/20 167 lb (75.8 kg)    Physical Exam Constitutional:      General: She is not in acute distress.     Appearance: She is well-developed.  HENT:     Head: Normocephalic and atraumatic.     Right Ear: External ear normal.     Left Ear: External ear normal.     Mouth/Throat:     Pharynx: No oropharyngeal exudate.  Eyes:     Conjunctiva/sclera: Conjunctivae normal.  Pupils: Pupils are equal, round, and reactive to light.  Neck:     Thyroid: No thyromegaly.  Cardiovascular:     Rate and Rhythm: Normal rate and regular rhythm.     Heart sounds: Normal heart sounds. No murmur heard.   No friction rub. No gallop.  Pulmonary:     Effort: Pulmonary effort is normal.     Breath sounds: Normal breath sounds.  Abdominal:     General: Bowel sounds are normal. There is no distension.     Palpations: Abdomen is soft. There is no mass.     Tenderness: There is no abdominal tenderness. There is no guarding.     Hernia: No hernia is present.  Musculoskeletal:        General: No tenderness or deformity. Normal range of motion.     Cervical back: Normal range of motion and neck supple.  Lymphadenopathy:     Cervical: No cervical adenopathy.  Skin:    General: Skin is warm and dry.     Findings: No rash.  Neurological:     Mental Status: She is alert and oriented to person, place, and time.     Deep Tendon Reflexes: Reflexes normal.     Reflex Scores:      Tricep reflexes are 2+ on the right side and 2+ on the left side.      Bicep reflexes are 2+ on the right side and 2+ on the left side.      Brachioradialis reflexes are 2+ on the right side and 2+ on the left side.      Patellar reflexes are 2+ on the right side and 2+ on the left side. Psychiatric:        Speech: Speech normal.        Behavior: Behavior normal.        Thought Content: Thought content normal.    Assessment/Plan: Health Maintenance Due  Topic Date Due   COVID-19 Vaccine (4 - Booster for Pfizer series) 01/24/2021   Health Maintenance reviewed.  1. Preventative health care Keep up with healthy eating and regular  exercise.  2. Allergy, subsequent encounter She has had chronic issues with allergies.  Is been years since she seen an allergist, we discussed this is with trying to see if she can get some relief from significant congestion. - Ambulatory referral to Allergy  3. Vitamin D deficiency She is taking 2000 units daily.  4. Vitamin B 12 deficiency She is decrease supplementation since last time due to elevated B12 levels. - CBC with Differential/Platelet; Future - Vitamin B12; Future  5. Lipid screening - Lipid panel; Future  6. Screening for diabetes mellitus - Comprehensive metabolic panel; Future  7. Need for immunization against influenza - Flu Vaccine QUAD 6+ mos PF IM (Fluarix Quad PF)  Return for pending labs.  Micheline Rough, MD

## 2021-06-19 NOTE — Patient Instructions (Signed)
Check blood pressures at home and let me know how numbers are when I call with lab results. Goal is less than 125/85.

## 2021-06-21 ENCOUNTER — Other Ambulatory Visit (INDEPENDENT_AMBULATORY_CARE_PROVIDER_SITE_OTHER): Payer: BC Managed Care – PPO

## 2021-06-21 ENCOUNTER — Other Ambulatory Visit: Payer: Self-pay

## 2021-06-21 DIAGNOSIS — Z13220 Encounter for screening for lipoid disorders: Secondary | ICD-10-CM

## 2021-06-21 DIAGNOSIS — E538 Deficiency of other specified B group vitamins: Secondary | ICD-10-CM

## 2021-06-21 DIAGNOSIS — Z131 Encounter for screening for diabetes mellitus: Secondary | ICD-10-CM

## 2021-06-21 LAB — COMPREHENSIVE METABOLIC PANEL
ALT: 22 U/L (ref 0–35)
AST: 20 U/L (ref 0–37)
Albumin: 4.2 g/dL (ref 3.5–5.2)
Alkaline Phosphatase: 69 U/L (ref 39–117)
BUN: 7 mg/dL (ref 6–23)
CO2: 28 mEq/L (ref 19–32)
Calcium: 9 mg/dL (ref 8.4–10.5)
Chloride: 106 mEq/L (ref 96–112)
Creatinine, Ser: 0.58 mg/dL (ref 0.40–1.20)
GFR: 106.52 mL/min (ref >60.00)
Glucose, Bld: 96 mg/dL (ref 70–99)
Potassium: 4 mEq/L (ref 3.5–5.1)
Sodium: 139 mEq/L (ref 135–145)
Total Bilirubin: 0.3 mg/dL (ref 0.2–1.2)
Total Protein: 6.9 g/dL (ref 6.0–8.3)

## 2021-06-21 LAB — CBC WITH DIFFERENTIAL/PLATELET
Basophils Absolute: 0.1 10*3/uL (ref 0.0–0.1)
Basophils Relative: 0.9 % (ref 0.0–3.0)
Eosinophils Absolute: 0.2 10*3/uL (ref 0.0–0.7)
Eosinophils Relative: 3.6 % (ref 0.0–5.0)
HCT: 43.4 % (ref 36.0–46.0)
Hemoglobin: 14.3 g/dL (ref 12.0–15.0)
Lymphocytes Relative: 28.5 % (ref 12.0–46.0)
Lymphs Abs: 1.8 10*3/uL (ref 0.7–4.0)
MCHC: 33 g/dL (ref 30.0–36.0)
MCV: 91.6 fl (ref 78.0–100.0)
Monocytes Absolute: 0.4 10*3/uL (ref 0.1–1.0)
Monocytes Relative: 6.2 % (ref 3.0–12.0)
Neutro Abs: 3.8 10*3/uL (ref 1.4–7.7)
Neutrophils Relative %: 60.8 % (ref 43.0–77.0)
Platelets: 298 10*3/uL (ref 150.0–400.0)
RBC: 4.74 Mil/uL (ref 3.87–5.11)
RDW: 13.4 % (ref 11.5–15.5)
WBC: 6.2 10*3/uL (ref 4.0–10.5)

## 2021-06-21 LAB — LIPID PANEL
Cholesterol: 184 mg/dL (ref 0–200)
HDL: 50.8 mg/dL (ref >39.00)
LDL Cholesterol: 117 mg/dL — ABNORMAL HIGH (ref 0–99)
NonHDL: 133.54
Total CHOL/HDL Ratio: 4
Triglycerides: 83 mg/dL (ref 0.0–149.0)
VLDL: 16.6 mg/dL (ref 0.0–40.0)

## 2021-06-21 LAB — VITAMIN B12: Vitamin B-12: 1017 pg/mL — ABNORMAL HIGH (ref 211–911)

## 2021-06-23 ENCOUNTER — Encounter: Payer: Self-pay | Admitting: Family Medicine

## 2021-07-10 DIAGNOSIS — N939 Abnormal uterine and vaginal bleeding, unspecified: Secondary | ICD-10-CM | POA: Diagnosis not present

## 2021-07-17 DIAGNOSIS — F411 Generalized anxiety disorder: Secondary | ICD-10-CM | POA: Diagnosis not present

## 2021-07-18 ENCOUNTER — Ambulatory Visit: Payer: BC Managed Care – PPO | Admitting: Allergy

## 2021-07-18 ENCOUNTER — Other Ambulatory Visit: Payer: Self-pay

## 2021-07-18 ENCOUNTER — Encounter: Payer: Self-pay | Admitting: Allergy

## 2021-07-18 VITALS — BP 126/82 | HR 83 | Temp 98.3°F | Resp 18 | Ht 65.0 in | Wt 159.8 lb

## 2021-07-18 DIAGNOSIS — J3089 Other allergic rhinitis: Secondary | ICD-10-CM | POA: Diagnosis not present

## 2021-07-18 DIAGNOSIS — H1013 Acute atopic conjunctivitis, bilateral: Secondary | ICD-10-CM | POA: Diagnosis not present

## 2021-07-18 DIAGNOSIS — J4520 Mild intermittent asthma, uncomplicated: Secondary | ICD-10-CM

## 2021-07-18 MED ORDER — RYALTRIS 665-25 MCG/ACT NA SUSP: 1.0000 | Freq: Two times a day (BID) | NASAL | 5 refills | Status: DC

## 2021-07-18 NOTE — Assessment & Plan Note (Signed)
.   See assessment and plan as above. 

## 2021-07-18 NOTE — Progress Notes (Signed)
New Patient Note  RE: Ashley Allen MRN: [id number] DOB: 1971-07-15 Date of Office Visit: 07/18/2021  Consult requested by: Wynn Banker, MD Primary care provider: Wynn Banker, MD  Chief Complaint: Allergy Testing (Shots did not help her allergy - had an allergy test 10 years ago. Trees, dust, ) and Allergic Rhinitis  (Constant post nasal drip take zyrtec/Claritin, does nasal saline rinses, flonase is not covered by her insurance anymore does not use it. )  History of Present Illness: I had the pleasure of seeing [name] for initial evaluation at the [facility] on 07/18/2021. She is a 50 y.o. female, who is referred here by Wynn Banker, MD for the evaluation of allergic rhinitis.  She reports symptoms of PND, nasal congestion, sneezing, rhinorrhea, coughing, itchy/watery eyes. Symptoms have been going on for 40+ years. The symptoms are present all year around with worsening in spring and fall. Other triggers include exposure to unsure. Anosmia: no. Headache: sometimes. She has used zyrtec, Claritin, Flonase, otc eye drops with fair improvement in symptoms. Sinus infections: 2 per year. Previous work up includes: 20 years ago had skin testing was positive to multiple items. No prior AIT. Previous ENT evaluation: sinus surgery in 2010. Previous sinus imaging: not recently. History of nasal polyps: no. Last eye exam: last year. History of reflux: sometimes.  Assessment and Plan: Terriona is a 50 y.o. female with: Other allergic rhinitis Perennial rhino conjunctivitis symptoms for 40+ years with worsening in the spring and fall. No prior AIT. Sinus surgery in 2010. 4 cats and 2 dogs at home.  Today's skin testing showed: Positive to grass, trees, cat. Borderline to mold. Start environmental control measures as below. Use over the counter antihistamines such as Zyrtec (cetirizine), Claritin (loratadine), Allegra (fexofenadine), or Xyzal  (levocetirizine) daily as needed. May take twice a day during allergy flares. May switch antihistamines every few months. Start Ryaltris (olopatadine + mometasone nasal spray combination) 1 spray per nostril twice a day. Sample given.  If it's not covered let us know.  Nasal saline spray (i.e., Simply Saline) or nasal saline lavage (i.e., NeilMed) is recommended as needed and prior to medicated nasal sprays. Consider allergy injections for long term control if above medications do not help the symptoms - handout given.  Let us know if insurance covers it and make appointment for first injection in the [place] office.   Allergic conjunctivitis of both eyes See assessment and plan as above.  Mild intermittent reactive airway disease Uses albuterol during URIs with good benefit. Last use was in January 2022. Today's spirometry was normal. May use albuterol rescue inhaler 2 puffs every 4 to 6 hours as needed for shortness of breath, chest tightness, coughing, and wheezing. Monitor frequency of use.   Return in about 3 months (around 10/18/2021).  Meds ordered this encounter  Medications   Olopatadine-Mometasone (RYALTRIS) 665-25 MCG/ACT SUSP    Sig: Place 1-2 sprays into the nose in the morning and at bedtime.    Dispense:  29 g    Refill:  5    Lab Orders  No laboratory test(s) ordered today    Other allergy screening: Asthma: no Only used albuterol during URIs.  Food allergy: no Medication allergy: yes Hymenoptera allergy: no Urticaria: no Eczema:no History of recurrent infections suggestive of immunodeficency: no  Diagnostics: Spirometry:  Tracings reviewed. Her effort: Good reproducible efforts. FVC: 3.18L FEV1: 2.56L, 86% predicted FEV1/FVC ratio: 81% Interpretation: Spirometry consistent with normal pattern.  Please see scanned spirometry results for details.  Skin Testing: Environmental allergy panel. Positive to grass, trees, cat. Borderline to mold. Results  discussed with patient/family.  Airborne Adult Perc - 07/18/21 1507     Time Antigen Placed 1507    Allergen Manufacturer Waynette Buttery    Location Back    Number of Test 59    Panel 1 Select    1. Control-Buffer 50% Glycerol Negative    2. Control-Histamine 1 mg/ml 2+    3. Albumin saline Negative    4. Bahia Negative    5. French Southern Territ[place] Negative    6. Johnson Negative    7. Kentucky Blue Negative    8. Meadow Fescue Negative    9. Perennial Rye Negative    10. Sweet Vernal Negative    11. Timothy Negative    12. Cocklebur Negative    13. Burweed Marshelder Negative    14. Ragweed, short Negative    15. Ragweed, Giant Negative    16. Plantain,  English Negative    17. Lamb's Quarters Negative    18. Sheep Sorrell Negative    19. Rough Pigweed Negative    20. Marsh Elder, Rough Negative    21. Mugwort, Common Negative    22. Ash mix Negative    23. Birch mix Negative    24. Beech American Negative    25. Box, Elder Negative    26. Cedar, red Negative    27. Cottonwood, Guinea-Bissau Negative    28. Elm mix Negative    29. Hickory Negative    30. Maple mix Negative    31. Oak, Guinea-Bissau mix Negative    32. Pecan Pollen Negative    33. Pine mix Negative    34. Sycamore Eastern Negative    35. Walnut, Black Pollen Negative    36. Alternaria alternata Negative    37. Cladosporium Herbarum Negative    38. Aspergillus mix Negative    39. Penicillium mix Negative    40. Bipolaris sorokiniana (Helminthosporium) Negative    41. Drechslera spicifera (Curvularia) Negative    42. Mucor plumbeus Negative    43. Fusarium moniliforme Negative    44. Aureobasidium pullulans (pullulara) Negative    45. Rhizopus oryzae Negative    46. Botrytis cinera Negative    47. Epicoccum nigrum Negative    48. Phoma betae Negative    49. Candida Albicans Negative    50. Trichophyton mentagrophytes Negative    51. Mite, D Farinae  5,000 AU/ml Negative    52. Mite, D Pteronyssinus  5,000 AU/ml Negative    53.  Cat Hair 10,000 BAU/ml Negative    54.  Dog Epithelia Negative    55. Mixed Feathers Negative    56. Horse Epithelia Negative    57. Cockroach, German Negative    58. Mouse Negative    59. Tobacco Leaf Negative             Intradermal - 07/18/21 1528     Time Antigen Placed 1529    Allergen Manufacturer Waynette Buttery    Location Arm    Number of Test 15    Control Negative    French [place] 2+    Johnson 2+    7 Grass --   +/-   Ragweed mix Negative    Weed mix Negative    Tree mix 2+    Mold 1 Negative    Mold 2 Negative    Mold 3 --   +/-   Mold 4 --   +/-  Cat 2+    Dog Negative    Cockroach Negative    Mite mix Negative             Past Medical History: Patient Active Problem List   Diagnosis Date Noted   Other allergic rhinitis 07/18/2021   Allergic conjunctivitis of both eyes 07/18/2021   Mild intermittent reactive airway disease 07/18/2021   Lumbar radiculopathy, right 09/14/2020   Breast ptosis 04/07/2019   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   Dyslipidemia 12/31/2017   Anxiety 12/31/2017   Hx of melanoma of skin 06/20/2017   SI (sacroiliac) joint dysfunction 05/01/2017   Nonallopathic lesion of sacral region 05/01/2017   Low vitamin D level 04/10/2017   Low serum vitamin B12 04/10/2017   Multiple thyroid nodules 03/01/2016   H/O: hypothyroidism 03/01/2016   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, hx abn pap - advised to see gyn 05/15/2013   Depression, recurrent (HCC) 10/01/2012   OSA (obstructive sleep apnea) 10/01/2012   GERD 04/21/2007   Past Medical History:  Diagnosis Date   Anxiety    Chicken pox    Depression    GERD (gastroesophageal reflux disease)    Hx of melanoma of skin 06/20/2017   Sees dermatologist   Recurrent upper respiratory infection (URI)    Sleep apnea    Thyroid nodule    benign   Ulcer    UTI (urinary tract infection)    Past Surgical History: Past Surgical History:  Procedure Laterality Date   BREAST ENHANCEMENT  SURGERY  2007   NASAL SINUS SURGERY  ?2010   Medication List:  Current Outpatient Medications  Medication Sig Dispense Refill   buPROPion (WELLBUTRIN XL) 300 MG 24 hr tablet TAKE 1 TABLET BY MOUTH EVERY DAY IN THE MORNING 90 tablet 0   Cholecalciferol (VITAMIN D3) 25 MCG (1000 UT) CAPS Take 2,000 Units by mouth daily at 6 (six) AM.     levonorgestrel (MIRENA) 20 MCG/24HR IUD 1 each by Intrauterine route once.     Olopatadine-Mometasone (RYALTRIS) X543819 MCG/ACT SUSP Place 1-2 sprays into the nose in the morning and at bedtime. 29 g 5   valACYclovir (VALTREX) 500 MG tablet TAKE ONE TABLET BY MOUTH TWICE DAILY X 3 DAYS ONSET SYMPTOMS 30 tablet 0   gabapentin (NEURONTIN) 100 MG capsule Take 2 capsules (200 mg total) by mouth at bedtime. 180 capsule 0   LO LOESTRIN FE 1 MG-10 MCG / 10 MCG tablet Take 1 tablet by mouth daily.     metaxalone (SKELAXIN) 800 MG tablet Take 800 mg by mouth 3 (three) times daily.     No current facility-administered medications for this visit.   Allergies: Allergies  Allergen Reactions   Sulfa Antibiotics Hives, Itching and Rash   Bacitracin-Polymyxin B Other (See Comments) and Rash    Polysporin creates blistering reaction Blisters Polysporin creates blistering reaction blisters    Sulfonamide Derivatives     REACTION: Itching   Cyclobenzaprine Itching   Social History: Social History   Socioeconomic History   Marital status: Single    Spouse name: Not on file   Number of children: 0   Years of education: 16   Highest education level: Not on file  Occupational History   Occupation: Sales    Employer: [employer]  Tobacco Use   Smoking status: Former    Years: 3.00    Types: Cigarettes    Quit date: 09/24/2009    Years since quitting: 11.8   Smokeless tobacco: Never   Tobacco  comments:    previous social smoker   Substance and Sexual Activity   Alcohol use: Yes   Drug use: No   Sexual activity: Not on file  Other Topics Concern   Not on  file  Social History Narrative   Regular exercise-yes   Caffeine Use-yes   Social Determinants of Health   Financial Resource Strain: Not on file  Food Insecurity: Not on file  Transportation Needs: Not on file  Physical Activity: Not on file  Stress: Not on file  Social Connections: Not on file   Lives in a 49 year old house. Smoking: denies Occupation: Research officer, political party HistorySurveyor, minerals in the house: no Engineer, civil (consulting) in the family room: no Carpet in the bedroom: no Heating: electric Cooling: central Pet: yes 4 cats x 10 yrs and 2 dogs  Family History: Family History  Problem Relation Age of Onset   Alcohol abuse Father    Arthritis Father    Heart disease Father    Hypertension Father    Emphysema Father        heavy smoker   Arthritis Mother    Hyperlipidemia Mother    Hypertension Mother    Uterine cancer Other    Colon cancer Other    Heart attack Paternal Grandmother        while pregnant   Cancer Other        Aunts   Colon cancer Paternal Aunt    Stomach cancer Maternal Aunt        spread to colon    Cervical cancer Paternal Aunt    High blood pressure Sister    High Cholesterol Sister    High blood pressure Brother    Alcohol abuse Brother    Stroke Maternal Grandmother 61   Heart attack Maternal Grandfather 50   Liver cancer Maternal Aunt    High blood pressure Sister    High Cholesterol Sister    High blood pressure Brother    Obesity Brother    High blood pressure Brother    Stroke Paternal Uncle 40   Stroke Paternal Uncle 40   Problem                               Relation Asthma                                   No  Eczema                                No  Food allergy                          No  Allergic rhino conjunctivitis     No  Review of Systems  Constitutional:  Negative for appetite change, chills, fever and unexpected weight change.  HENT:  Positive for congestion, postnasal drip and rhinorrhea.   Eyes:  Negative  for itching.  Respiratory:  Positive for cough. Negative for chest tightness, shortness of breath and wheezing.   Cardiovascular:  Negative for chest pain.  Gastrointestinal:  Negative for abdominal pain.  Genitourinary:  Negative for difficulty urinating.  Skin:  Negative for rash.  Allergic/Immunologic: Positive for environmental allergies.  Neurological:  Positive for headaches.   Objective: BP 126/82  Pulse 83   Temp 98.3 F (36.8 C)   Resp 18   Ht 5\' 5"  (1.651 m)   Wt 159 lb 12.8 oz (72.5 kg)   SpO2 98%   BMI 26.59 kg/m  Body mass index is 26.59 kg/m. Physical Exam Vitals and nursing note reviewed.  Constitutional:      Appearance: Normal appearance. She is well-developed.  HENT:     Head: Normocephalic and atraumatic.     Right Ear: Tympanic membrane and external ear normal.     Left Ear: Tympanic membrane and external ear normal.     Nose: Nose normal.     Mouth/Throat:     Mouth: Mucous membranes are moist.     Pharynx: Oropharynx is clear.  Eyes:     Conjunctiva/sclera: Conjunctivae normal.  Cardiovascular:     Rate and Rhythm: Normal rate and regular rhythm.     Heart sounds: Normal heart sounds. No murmur heard.   No friction rub. No gallop.  Pulmonary:     Effort: Pulmonary effort is normal.     Breath sounds: Normal breath sounds. No wheezing, rhonchi or rales.  Musculoskeletal:     Cervical back: Neck supple.  Skin:    General: Skin is warm.     Findings: No rash.  Neurological:     Mental Status: She is alert and oriented to person, place, and time.  Psychiatric:        Behavior: Behavior normal.  The plan was reviewed with the patient/family, and all questions/concerned were addressed.  It was my pleasure to see Lani today and participate in her care. Please feel free to contact me with any questions or concerns.  Sincerely,  , DO Allergy & Immunology  [facility] office:  [phone] [facility] office: [phone]

## 2021-07-18 NOTE — Assessment & Plan Note (Signed)
Perennial rhino conjunctivitis symptoms for 40+ years with worsening in the spring and fall. No prior AIT. Sinus surgery in 2010. 4 cats and 2 dogs at home.   Today's skin testing showed: Positive to grass, trees, cat. Borderline to mold.  Start environmental control measures as below.  Use over the counter antihistamines such as Zyrtec (cetirizine), Claritin (loratadine), Allegra (fexofenadine), or Xyzal (levocetirizine) daily as needed. May take twice a day during allergy flares. May switch antihistamines every few months. . Start Ryaltris (olopatadine + mometasone nasal spray combination) 1 spray per nostril twice a day. Sample given.  o If it's not covered let us know.   Nasal saline spray (i.e., Simply Saline) or nasal saline lavage (i.e., NeilMed) is recommended as needed and prior to medicated nasal sprays.  Consider allergy injections for long term control if above medications do not help the symptoms - handout given.   Let us know if insurance covers it and make appointment for first injection in the [place] office.

## 2021-07-18 NOTE — Assessment & Plan Note (Signed)
Uses albuterol during URIs with good benefit. Last use was in January 2022.  Today's spirometry was normal. . May use albuterol rescue inhaler 2 puffs every 4 to 6 hours as needed for shortness of breath, chest tightness, coughing, and wheezing. Monitor frequency of use.

## 2021-07-18 NOTE — Patient Instructions (Addendum)
Today's skin testing showed: Positive to grass, trees, cat. Borderline to mold. Results given.   Environmental allergies Start environmental control measures as below. Use over the counter antihistamines such as Zyrtec (cetirizine), Claritin (loratadine), Allegra (fexofenadine), or Xyzal (levocetirizine) daily as needed. May take twice a day during allergy flares. May switch antihistamines every few months. Start Ryaltris (olopatadine + mometasone nasal spray combination) 1 spray per nostril twice a day. Sample given.  If it's not covered let us know.  Nasal saline spray (i.e., Simply Saline) or nasal saline lavage (i.e., NeilMed) is recommended as needed and prior to medicated nasal sprays. Consider allergy injections for long term control if above medications do not help the symptoms - handout given.  Let us know if insurance covers it and make appointment for first injection in the [place] office.   Breathing: Normal breathing test today. May use albuterol rescue inhaler 2 puffs every 4 to 6 hours as needed for shortness of breath, chest tightness, coughing, and wheezing. Monitor frequency of use.   Follow up in 3 months or sooner if needed.    Reducing Pollen Exposure Pollen seasons: trees (spring), grass (summer) and ragweed/weeds (fall). Keep windows closed in your home and car to lower pollen exposure.  Install air conditioning in the bedroom and throughout the house if possible.  Avoid going out in dry windy days - especially early morning. Pollen counts are highest between 5 - 10 AM and on dry, hot and windy days.  Save outside activities for late afternoon or after a heavy rain, when pollen levels are lower.  Avoid mowing of grass if you have grass pollen allergy. Be aware that pollen can also be transported indoors on people and pets.  Dry your clothes in an automatic dryer rather than hanging them outside where they might collect pollen.  Rinse hair and eyes before  bedtime. Pet Allergen Avoidance: Contrary to popular opinion, there are no "hypoallergenic" breeds of dogs or cats. That is because people are not allergic to an animal's hair, but to an allergen found in the animal's saliva, dander (dead skin flakes) or urine. Pet allergy symptoms typically occur within minutes. For some people, symptoms can build up and become most severe 8 to 12 hours after contact with the animal. People with severe allergies can experience reactions in public places if dander has been transported on the pet owners' clothing. Keeping an animal outdoors is only a partial solution, since homes with pets in the yard still have higher concentrations of animal allergens. Before getting a pet, ask your allergist to determine if you are allergic to animals. If your pet is already considered part of your family, try to minimize contact and keep the pet out of the bedroom and other rooms where you spend a great deal of time. As with dust mites, vacuum carpets often or replace carpet with a hardwood floor, tile or linoleum. High-efficiency particulate air (HEPA) cleaners can reduce allergen levels over time. While dander and saliva are the source of cat and dog allergens, urine is the source of allergens from rabbits, hamsters, mice and [place] pigs; so ask a non-allergic family member to clean the animal's cage. If you have a pet allergy, talk to your allergist about the potential for allergy immunotherapy (allergy shots). This strategy can often provide long-term relief. Mold Control Mold and fungi can grow on a variety of surfaces provided certain temperature and moisture conditions exist.  Outdoor molds grow on plants, decaying vegetation and soil. The  major outdoor mold, Alternaria and Cladosporium, are found in very high numbers during hot and dry conditions. Generally, a late summer - fall peak is seen for common outdoor fungal spores. Rain will temporarily lower outdoor mold spore count,  but counts rise rapidly when the rainy period ends. The most important indoor molds are Aspergillus and Penicillium. Dark, humid and poorly ventilated basements are ideal sites for mold growth. The next most common sites of mold growth are the bathroom and the kitchen. Outdoor (Seasonal) Mold Control Use air conditioning and keep windows closed. Avoid exposure to decaying vegetation. Avoid leaf raking. Avoid grain handling. Consider wearing a face mask if working in moldy areas.  Indoor (Perennial) Mold Control  Maintain humidity below 50%. Get rid of mold growth on hard surfaces with water, detergent and, if necessary, 5% bleach (do not mix with other cleaners). Then dry the area completely. If mold covers an area more than 10 square feet, consider hiring an indoor environmental professional. For clothing, washing with soap and water is best. If moldy items cannot be cleaned and dried, throw them away. Remove sources e.g. contaminated carpets. Repair and seal leaking roofs or pipes. Using dehumidifiers in damp basements may be helpful, but empty the water and clean units regularly to prevent mildew from forming. All rooms, especially basements, bathrooms and kitchens, require ventilation and cleaning to deter mold and mildew growth. Avoid carpeting on concrete or damp floors, and storing items in damp areas.

## 2021-07-24 ENCOUNTER — Other Ambulatory Visit: Payer: Self-pay | Admitting: Family Medicine

## 2021-07-24 ENCOUNTER — Encounter: Payer: Self-pay | Admitting: Family Medicine

## 2021-08-01 DIAGNOSIS — K5904 Chronic idiopathic constipation: Secondary | ICD-10-CM | POA: Diagnosis not present

## 2021-08-01 DIAGNOSIS — Z86010 Personal history of colonic polyps: Secondary | ICD-10-CM | POA: Diagnosis not present

## 2021-08-01 DIAGNOSIS — Z800 Family history of malignant neoplasm of digestive organs: Secondary | ICD-10-CM | POA: Diagnosis not present

## 2021-08-01 DIAGNOSIS — Z1211 Encounter for screening for malignant neoplasm of colon: Secondary | ICD-10-CM | POA: Diagnosis not present

## 2021-08-07 DIAGNOSIS — Z6831 Body mass index (BMI) 31.0-31.9, adult: Secondary | ICD-10-CM | POA: Diagnosis not present

## 2021-08-07 DIAGNOSIS — M25521 Pain in right elbow: Secondary | ICD-10-CM | POA: Diagnosis not present

## 2021-08-08 IMAGING — US US THYROID
1 series · 13 of 25 positions shown · non-contrast
Comparison: 08/12/2017

CLINICAL DATA: Thyroid nodule

EXAM:
THYROID ULTRASOUND
TECHNIQUE: Ultrasound examination of the thyroid gland and adjacent soft
tissues was performed.

[Series 1: us thyroid · 0.07mm/px · 13 of 48 slices shown]
[im 1/48]
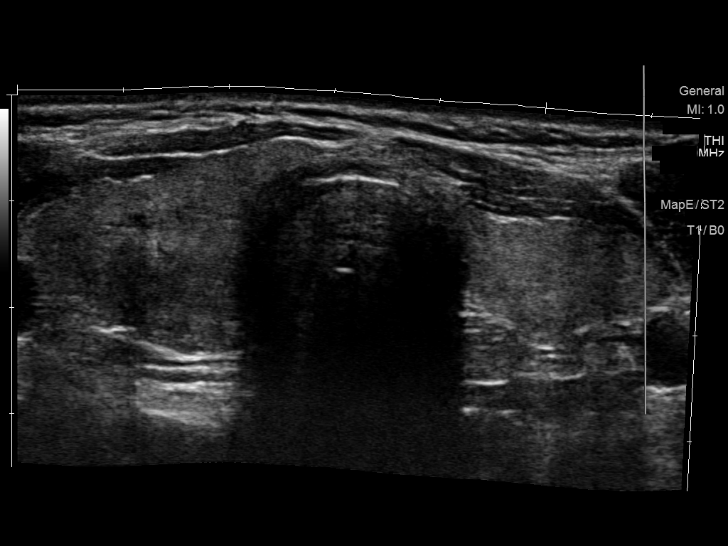
[im 4/48]
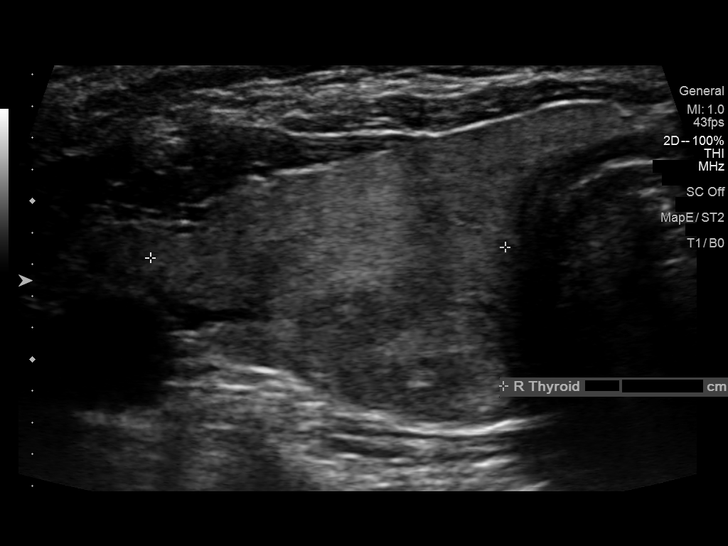
[im 8/48]
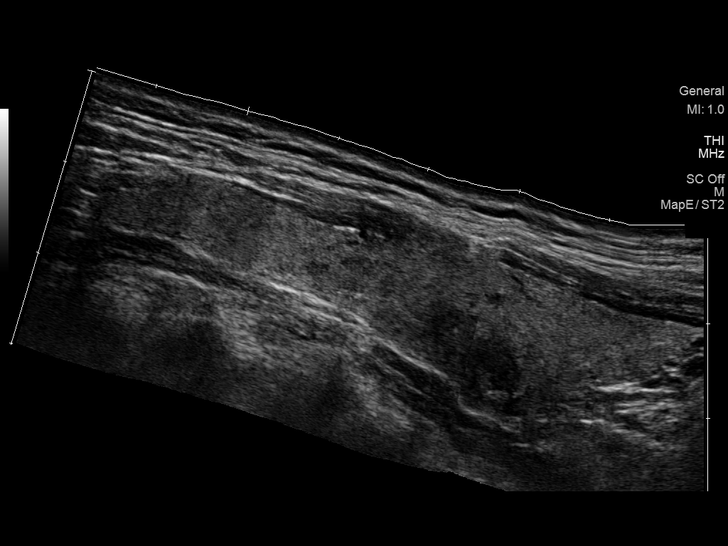
[im 12/48]
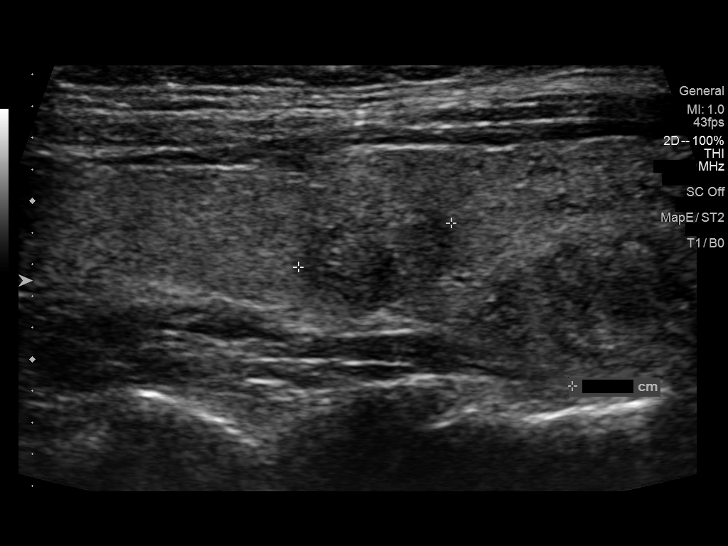
[im 16/48]
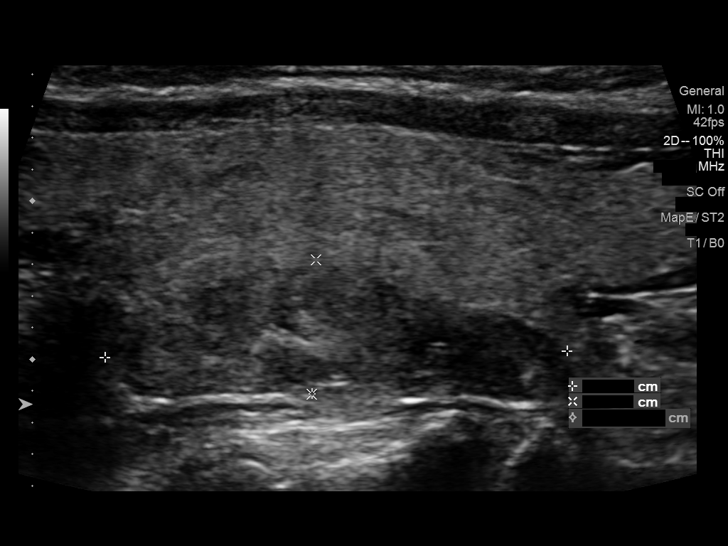
[im 20/48]
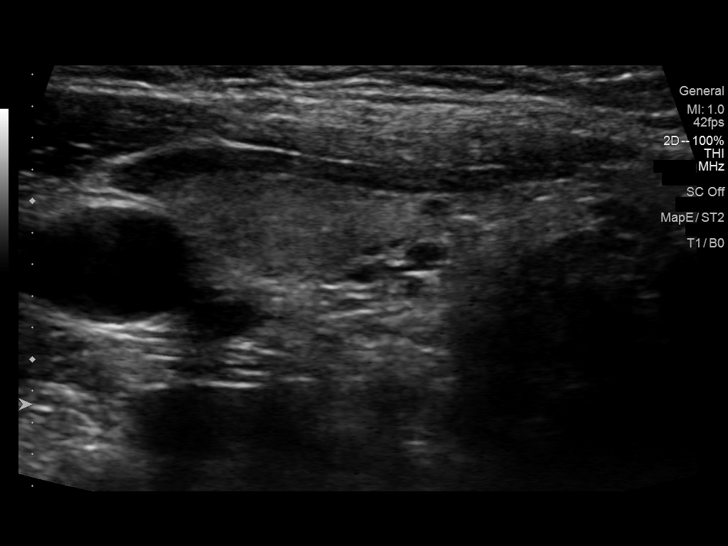
[im 24/48]
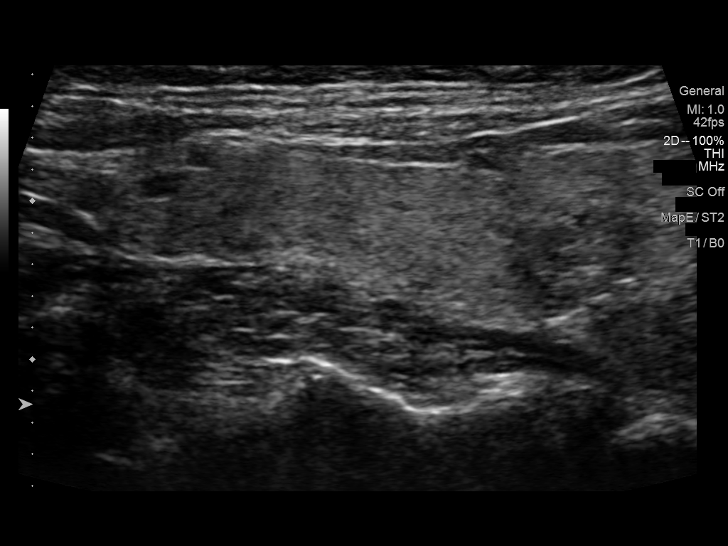
[im 28/48]
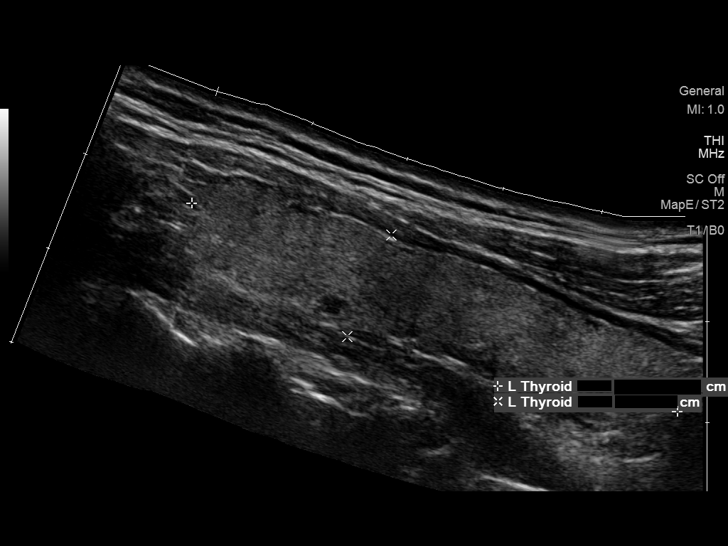
[im 32/48]
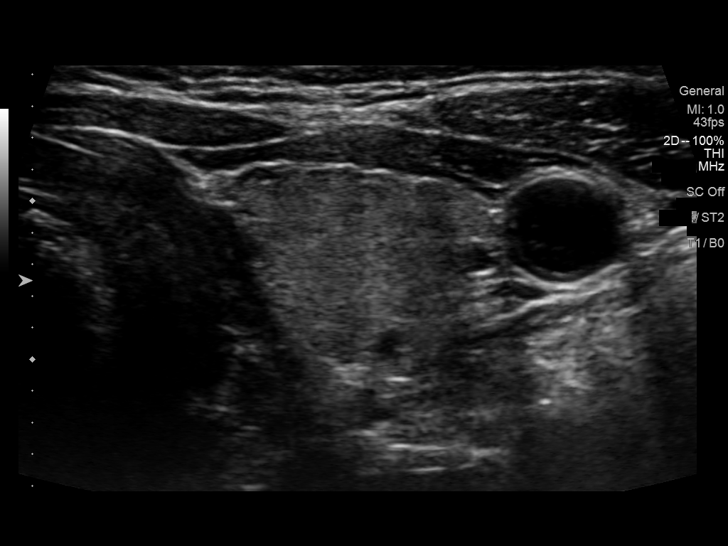
[im 36/48]
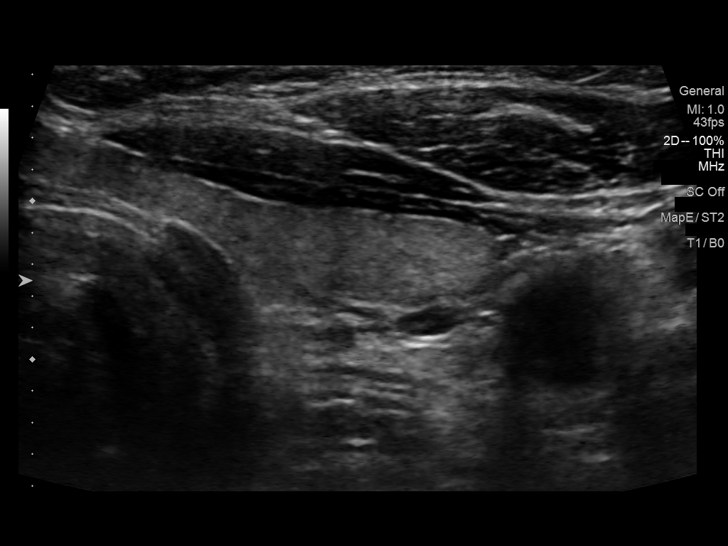
[im 40/48]
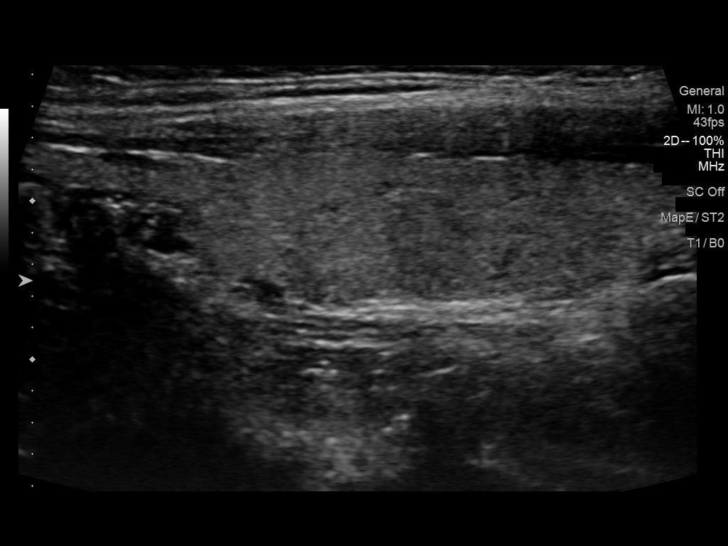
[im 44/48]
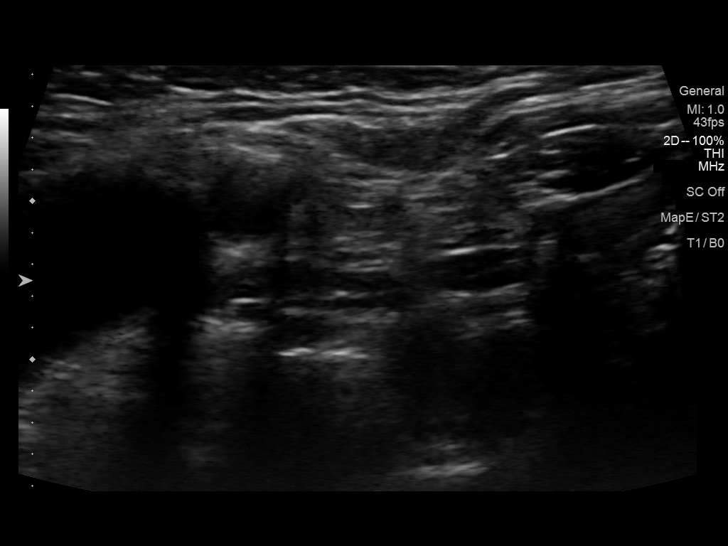
[im 48/48]
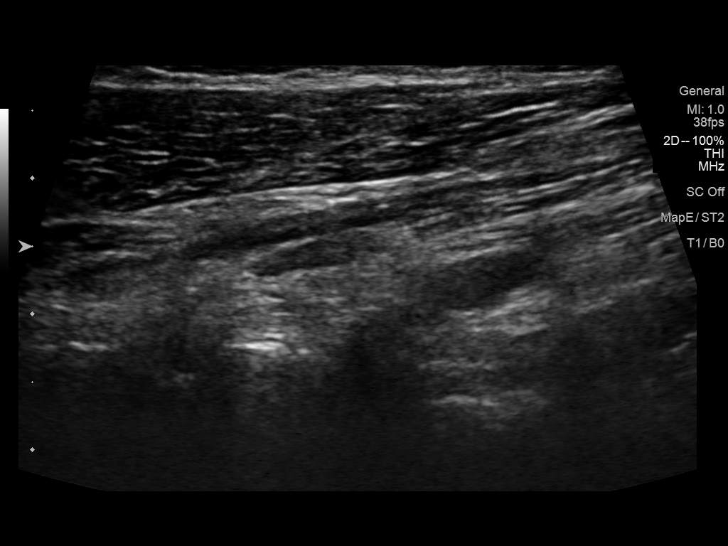

[13 of 25 positions shown; findings below may reference images not displayed]

FINDINGS: Parenchymal Echotexture: Moderately heterogenous

Isthmus: 3 mm

Right lobe: 6.5 x 1.4 x 2.2 cm

Left lobe: 5.2 x 1.1 x 2.1 cm

_________________________________________________________

Estimated total number of nodules >/= 1 cm: 2

Number of spongiform nodules >/=  2 cm not described below (TR1): 0

Number of mixed cystic and solid nodules >/= 1.5 cm not described
below (TR2): 0

_________________________________________________________

Nodule # 1:

Location: Right; Mid

Maximum size: 1.1, previously 1.0 cm; Other 2 dimensions: 1.0 x
cm

Composition: solid/almost completely solid (2)

Echogenicity: isoechoic (1)

Shape: not taller-than-wide (0)

Margins: ill-defined (0)

Echogenic foci: none (0)

ACR TI-RADS total points: 3.

ACR TI-RADS risk category: TR3 (3 points).

ACR TI-RADS recommendations:

Given size (<1.4 cm) and appearance, this nodule does NOT meet
TI-RADS criteria for biopsy or dedicated follow-up. This nodule has
been down graded (previously TR 4).

_________________________________________________________

Nodule # 2:

Location: Right; Inferior

Maximum size: 2.9, previously 3.0 cm; Other 2 dimensions: 1.0 x
cm

Composition: solid/almost completely solid (2)

Echogenicity: hypoechoic (2)

Shape: not taller-than-wide (0)

Margins: ill-defined (0)

Echogenic foci: none (0)

ACR TI-RADS total points: 4.

ACR TI-RADS risk category: TR4 (4-6 points).

ACR TI-RADS recommendations:

**Given size (>/= 1.5 cm) and appearance, fine needle aspiration of
this moderately suspicious nodule should be considered based on
TI-RADS criteria.

_________________________________________________________

Stable moderate gland heterogeneity. No significant left thyroid
abnormality. No regional adenopathy.
IMPRESSION: 1.1 cm right mid thyroid TR 3 nodule does not meet criteria for
biopsy or follow-up. See above comment.

2.9 cm right inferior TR 4 nodule continues to meet criteria for
biopsy.

The above is in keeping with the ACR TI-RADS recommendations - [HOSPITAL] 4200;[DATE].

## 2021-08-16 ENCOUNTER — Telehealth: Payer: Self-pay

## 2021-08-16 NOTE — Telephone Encounter (Signed)
I called the patient because the [place] office received a fax that [facility] was trying to reach the patient in regards to setting up a shipment for the Ryaltris nasal spray. Patient plans to call [phone].

## 2021-08-16 NOTE — Telephone Encounter (Signed)
Noted  

## 2021-08-30 DIAGNOSIS — S86812A Strain of other muscle(s) and tendon(s) at lower leg level, left leg, initial encounter: Secondary | ICD-10-CM | POA: Diagnosis not present

## 2021-09-01 DIAGNOSIS — S86112A Strain of other muscle(s) and tendon(s) of posterior muscle group at lower leg level, left leg, initial encounter: Secondary | ICD-10-CM | POA: Diagnosis not present

## 2021-09-01 DIAGNOSIS — Z6828 Body mass index (BMI) 28.0-28.9, adult: Secondary | ICD-10-CM | POA: Diagnosis not present

## 2021-09-08 DIAGNOSIS — M25671 Stiffness of right ankle, not elsewhere classified: Secondary | ICD-10-CM | POA: Diagnosis not present

## 2021-09-08 DIAGNOSIS — M25675 Stiffness of left foot, not elsewhere classified: Secondary | ICD-10-CM | POA: Diagnosis not present

## 2021-09-08 DIAGNOSIS — S86112A Strain of other muscle(s) and tendon(s) of posterior muscle group at lower leg level, left leg, initial encounter: Secondary | ICD-10-CM | POA: Diagnosis not present

## 2021-09-08 DIAGNOSIS — M25674 Stiffness of right foot, not elsewhere classified: Secondary | ICD-10-CM | POA: Diagnosis not present

## 2021-09-08 DIAGNOSIS — M25571 Pain in right ankle and joints of right foot: Secondary | ICD-10-CM | POA: Diagnosis not present

## 2021-09-08 DIAGNOSIS — M79661 Pain in right lower leg: Secondary | ICD-10-CM | POA: Diagnosis not present

## 2021-09-08 DIAGNOSIS — S42402A Unspecified fracture of lower end of left humerus, initial encounter for closed fracture: Secondary | ICD-10-CM | POA: Diagnosis not present

## 2021-09-08 DIAGNOSIS — X58XXXD Exposure to other specified factors, subsequent encounter: Secondary | ICD-10-CM | POA: Diagnosis not present

## 2021-09-08 DIAGNOSIS — M79671 Pain in right foot: Secondary | ICD-10-CM | POA: Diagnosis not present

## 2021-09-08 DIAGNOSIS — R109 Unspecified abdominal pain: Secondary | ICD-10-CM | POA: Diagnosis not present

## 2021-09-08 DIAGNOSIS — M25672 Stiffness of left ankle, not elsewhere classified: Secondary | ICD-10-CM | POA: Diagnosis not present

## 2021-09-08 DIAGNOSIS — S86112D Strain of other muscle(s) and tendon(s) of posterior muscle group at lower leg level, left leg, subsequent encounter: Secondary | ICD-10-CM | POA: Diagnosis not present

## 2021-09-08 DIAGNOSIS — R29898 Other symptoms and signs involving the musculoskeletal system: Secondary | ICD-10-CM | POA: Diagnosis not present

## 2021-09-08 DIAGNOSIS — X58XXXA Exposure to other specified factors, initial encounter: Secondary | ICD-10-CM | POA: Diagnosis not present

## 2021-09-11 DIAGNOSIS — M25671 Stiffness of right ankle, not elsewhere classified: Secondary | ICD-10-CM | POA: Diagnosis not present

## 2021-09-11 DIAGNOSIS — M25672 Stiffness of left ankle, not elsewhere classified: Secondary | ICD-10-CM | POA: Diagnosis not present

## 2021-09-11 DIAGNOSIS — M79661 Pain in right lower leg: Secondary | ICD-10-CM | POA: Diagnosis not present

## 2021-09-11 DIAGNOSIS — X58XXXD Exposure to other specified factors, subsequent encounter: Secondary | ICD-10-CM | POA: Diagnosis not present

## 2021-09-11 DIAGNOSIS — M25674 Stiffness of right foot, not elsewhere classified: Secondary | ICD-10-CM | POA: Diagnosis not present

## 2021-09-11 DIAGNOSIS — R29898 Other symptoms and signs involving the musculoskeletal system: Secondary | ICD-10-CM | POA: Diagnosis not present

## 2021-09-11 DIAGNOSIS — M79671 Pain in right foot: Secondary | ICD-10-CM | POA: Diagnosis not present

## 2021-09-11 DIAGNOSIS — M25571 Pain in right ankle and joints of right foot: Secondary | ICD-10-CM | POA: Diagnosis not present

## 2021-09-11 DIAGNOSIS — R109 Unspecified abdominal pain: Secondary | ICD-10-CM | POA: Diagnosis not present

## 2021-09-11 DIAGNOSIS — M25675 Stiffness of left foot, not elsewhere classified: Secondary | ICD-10-CM | POA: Diagnosis not present

## 2021-09-11 DIAGNOSIS — S86112A Strain of other muscle(s) and tendon(s) of posterior muscle group at lower leg level, left leg, initial encounter: Secondary | ICD-10-CM | POA: Diagnosis not present

## 2021-09-11 DIAGNOSIS — S42402A Unspecified fracture of lower end of left humerus, initial encounter for closed fracture: Secondary | ICD-10-CM | POA: Diagnosis not present

## 2021-09-11 DIAGNOSIS — X58XXXA Exposure to other specified factors, initial encounter: Secondary | ICD-10-CM | POA: Diagnosis not present

## 2021-09-11 DIAGNOSIS — S86112D Strain of other muscle(s) and tendon(s) of posterior muscle group at lower leg level, left leg, subsequent encounter: Secondary | ICD-10-CM | POA: Diagnosis not present

## 2021-09-13 DIAGNOSIS — R109 Unspecified abdominal pain: Secondary | ICD-10-CM | POA: Diagnosis not present

## 2021-09-13 DIAGNOSIS — X58XXXA Exposure to other specified factors, initial encounter: Secondary | ICD-10-CM | POA: Diagnosis not present

## 2021-09-13 DIAGNOSIS — M79671 Pain in right foot: Secondary | ICD-10-CM | POA: Diagnosis not present

## 2021-09-13 DIAGNOSIS — S86112A Strain of other muscle(s) and tendon(s) of posterior muscle group at lower leg level, left leg, initial encounter: Secondary | ICD-10-CM | POA: Diagnosis not present

## 2021-09-13 DIAGNOSIS — S42402A Unspecified fracture of lower end of left humerus, initial encounter for closed fracture: Secondary | ICD-10-CM | POA: Diagnosis not present

## 2021-09-13 DIAGNOSIS — S86112D Strain of other muscle(s) and tendon(s) of posterior muscle group at lower leg level, left leg, subsequent encounter: Secondary | ICD-10-CM | POA: Diagnosis not present

## 2021-09-13 DIAGNOSIS — R29898 Other symptoms and signs involving the musculoskeletal system: Secondary | ICD-10-CM | POA: Diagnosis not present

## 2021-09-13 DIAGNOSIS — M25674 Stiffness of right foot, not elsewhere classified: Secondary | ICD-10-CM | POA: Diagnosis not present

## 2021-09-13 DIAGNOSIS — M25675 Stiffness of left foot, not elsewhere classified: Secondary | ICD-10-CM | POA: Diagnosis not present

## 2021-09-13 DIAGNOSIS — X58XXXD Exposure to other specified factors, subsequent encounter: Secondary | ICD-10-CM | POA: Diagnosis not present

## 2021-09-13 DIAGNOSIS — M79661 Pain in right lower leg: Secondary | ICD-10-CM | POA: Diagnosis not present

## 2021-09-13 DIAGNOSIS — M25672 Stiffness of left ankle, not elsewhere classified: Secondary | ICD-10-CM | POA: Diagnosis not present

## 2021-09-13 DIAGNOSIS — M25571 Pain in right ankle and joints of right foot: Secondary | ICD-10-CM | POA: Diagnosis not present

## 2021-09-13 DIAGNOSIS — M25671 Stiffness of right ankle, not elsewhere classified: Secondary | ICD-10-CM | POA: Diagnosis not present

## 2021-09-21 DIAGNOSIS — M25571 Pain in right ankle and joints of right foot: Secondary | ICD-10-CM | POA: Diagnosis not present

## 2021-09-21 DIAGNOSIS — M79671 Pain in right foot: Secondary | ICD-10-CM | POA: Diagnosis not present

## 2021-09-21 DIAGNOSIS — M79661 Pain in right lower leg: Secondary | ICD-10-CM | POA: Diagnosis not present

## 2021-09-21 DIAGNOSIS — M25674 Stiffness of right foot, not elsewhere classified: Secondary | ICD-10-CM | POA: Diagnosis not present

## 2021-09-21 DIAGNOSIS — R109 Unspecified abdominal pain: Secondary | ICD-10-CM | POA: Diagnosis not present

## 2021-09-21 DIAGNOSIS — M25671 Stiffness of right ankle, not elsewhere classified: Secondary | ICD-10-CM | POA: Diagnosis not present

## 2021-09-21 DIAGNOSIS — X58XXXA Exposure to other specified factors, initial encounter: Secondary | ICD-10-CM | POA: Diagnosis not present

## 2021-09-21 DIAGNOSIS — S86112A Strain of other muscle(s) and tendon(s) of posterior muscle group at lower leg level, left leg, initial encounter: Secondary | ICD-10-CM | POA: Diagnosis not present

## 2021-09-21 DIAGNOSIS — M25672 Stiffness of left ankle, not elsewhere classified: Secondary | ICD-10-CM | POA: Diagnosis not present

## 2021-09-21 DIAGNOSIS — S42402A Unspecified fracture of lower end of left humerus, initial encounter for closed fracture: Secondary | ICD-10-CM | POA: Diagnosis not present

## 2021-09-21 DIAGNOSIS — M25675 Stiffness of left foot, not elsewhere classified: Secondary | ICD-10-CM | POA: Diagnosis not present

## 2021-09-21 DIAGNOSIS — X58XXXD Exposure to other specified factors, subsequent encounter: Secondary | ICD-10-CM | POA: Diagnosis not present

## 2021-09-21 DIAGNOSIS — S86112D Strain of other muscle(s) and tendon(s) of posterior muscle group at lower leg level, left leg, subsequent encounter: Secondary | ICD-10-CM | POA: Diagnosis not present

## 2021-09-21 DIAGNOSIS — R29898 Other symptoms and signs involving the musculoskeletal system: Secondary | ICD-10-CM | POA: Diagnosis not present

## 2021-09-24 HISTORY — PX: AUGMENTATION MAMMAPLASTY: SUR837

## 2021-09-27 DIAGNOSIS — Z6828 Body mass index (BMI) 28.0-28.9, adult: Secondary | ICD-10-CM | POA: Diagnosis not present

## 2021-09-27 DIAGNOSIS — M25521 Pain in right elbow: Secondary | ICD-10-CM | POA: Diagnosis not present

## 2021-09-27 DIAGNOSIS — S86112A Strain of other muscle(s) and tendon(s) of posterior muscle group at lower leg level, left leg, initial encounter: Secondary | ICD-10-CM | POA: Diagnosis not present

## 2021-09-27 DIAGNOSIS — M7711 Lateral epicondylitis, right elbow: Secondary | ICD-10-CM | POA: Diagnosis not present

## 2021-10-02 DIAGNOSIS — D2261 Melanocytic nevi of right upper limb, including shoulder: Secondary | ICD-10-CM | POA: Diagnosis not present

## 2021-10-02 DIAGNOSIS — D2262 Melanocytic nevi of left upper limb, including shoulder: Secondary | ICD-10-CM | POA: Diagnosis not present

## 2021-10-02 DIAGNOSIS — Z85828 Personal history of other malignant neoplasm of skin: Secondary | ICD-10-CM | POA: Diagnosis not present

## 2021-10-02 DIAGNOSIS — Z85820 Personal history of malignant melanoma of skin: Secondary | ICD-10-CM | POA: Diagnosis not present

## 2021-10-02 DIAGNOSIS — L570 Actinic keratosis: Secondary | ICD-10-CM | POA: Diagnosis not present

## 2021-10-19 ENCOUNTER — Ambulatory Visit: Payer: BC Managed Care – PPO | Admitting: Allergy

## 2021-10-20 DIAGNOSIS — Z1211 Encounter for screening for malignant neoplasm of colon: Secondary | ICD-10-CM | POA: Diagnosis not present

## 2021-10-20 DIAGNOSIS — D128 Benign neoplasm of rectum: Secondary | ICD-10-CM | POA: Diagnosis not present

## 2021-10-20 DIAGNOSIS — K621 Rectal polyp: Secondary | ICD-10-CM | POA: Diagnosis not present

## 2021-10-20 LAB — HM COLONOSCOPY

## 2021-10-24 DIAGNOSIS — R059 Cough, unspecified: Secondary | ICD-10-CM | POA: Diagnosis not present

## 2021-10-24 DIAGNOSIS — Z20822 Contact with and (suspected) exposure to covid-19: Secondary | ICD-10-CM | POA: Diagnosis not present

## 2021-10-24 DIAGNOSIS — Z03818 Encounter for observation for suspected exposure to other biological agents ruled out: Secondary | ICD-10-CM | POA: Diagnosis not present

## 2021-10-24 DIAGNOSIS — J00 Acute nasopharyngitis [common cold]: Secondary | ICD-10-CM | POA: Diagnosis not present

## 2021-10-26 ENCOUNTER — Encounter: Payer: Self-pay | Admitting: Family Medicine

## 2021-11-20 DIAGNOSIS — J209 Acute bronchitis, unspecified: Secondary | ICD-10-CM | POA: Diagnosis not present

## 2021-11-22 DIAGNOSIS — H5052 Exophoria: Secondary | ICD-10-CM | POA: Diagnosis not present

## 2021-11-22 DIAGNOSIS — H5200 Hypermetropia, unspecified eye: Secondary | ICD-10-CM | POA: Diagnosis not present

## 2021-12-06 DIAGNOSIS — B3731 Acute candidiasis of vulva and vagina: Secondary | ICD-10-CM | POA: Diagnosis not present

## 2021-12-27 DIAGNOSIS — N939 Abnormal uterine and vaginal bleeding, unspecified: Secondary | ICD-10-CM | POA: Diagnosis not present

## 2021-12-27 DIAGNOSIS — Z30432 Encounter for removal of intrauterine contraceptive device: Secondary | ICD-10-CM | POA: Diagnosis not present

## 2021-12-27 DIAGNOSIS — N926 Irregular menstruation, unspecified: Secondary | ICD-10-CM | POA: Diagnosis not present

## 2022-01-01 ENCOUNTER — Encounter: Payer: Self-pay | Admitting: Family Medicine

## 2022-01-02 ENCOUNTER — Ambulatory Visit: Payer: BC Managed Care – PPO | Admitting: Family Medicine

## 2022-01-02 VITALS — BP 118/82 | HR 79 | Ht 65.0 in | Wt 163.8 lb

## 2022-01-02 DIAGNOSIS — G5701 Lesion of sciatic nerve, right lower limb: Secondary | ICD-10-CM

## 2022-01-02 MED ORDER — PREDNISONE 50 MG PO TABS: ORAL_TABLET | ORAL | 0 refills | Status: DC

## 2022-01-02 NOTE — Progress Notes (Signed)
? ?I, Philbert Riser, LAT, ATC acting as a scribe for Clementeen Graham, MD. ? ?Ashley Allen is a 51 y.o. female who presents to [facility] at [facility] today for low back pain/SI joint pain. Pt was previously seen by Dr. Katrinka Blazing on 09/14/20 for LBP.  Pt was last seen by Dr. Denyse Amass on 02/08/21 for L flank pain and was advised to cont diclofenac and metaxalone, use a heating pad and TENS unit, and was referred to [facility] PT. Pt sent a MyChart message on 01/01/22 reporting pain along her SI joint and was advised to schedule a f/u visit for further evaluation, prior to ordering advanced imaging. Today, pt reports low back flared back up about 3 weeks ago when getting into her car. Pt is unable to do ADL; walking her dog, exercising, etc. Pt locates pain to deep within the R buttock/R posterior hip. Pt c/o pain being a "stabbing" pain. ? ?Radiates: no ?LE numbness/tingling: no ?Aggravates: any movement, sitting, exercising, walking ?Treatments tried: Tylenol, Gabapentin ? ?Dx imaging: 09/14/20 L-spine XR ? 05/01/17 L-spine & R hip XR ?  ? ?Pertinent review of systems: No fevers or chills ? ?Relevant historical information: Sleep apnea.  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  Driving exacerbates this problem. ? ? ?Exam:  ?BP 118/82   Pulse 79   Ht 5\' 5"  (1.651 m)   Wt 163 lb 12.8 oz (74.3 kg)   SpO2 98%   BMI 27.26 kg/m?  ?General: Well Developed, well nourished, and in no acute distress.  ? ?MSK: Right hip normal-appearing ?Tender palpation along the piriformis muscle. ?Normal hip motion. ?Pain with crossover stretch and figure 4 stretch. ?Hip abduction strength diminished 4+/5.  This does reproduce pain. ?Hip external rotation strength reduced 4/5 with pain.  This does reproduce pain. ?Leg lengths are equal. ? ? ? ?Lab and Radiology Results ?EXAM: ?DG HIP (WITH OR WITHOUT PELVIS) 2-3V RIGHT ?  ?COMPARISON:  None. ?  ?FINDINGS: ?No fracture.  No bone lesion. ?  ?The hip joint is normally spaced  and aligned. No arthropathic ?change. ?  ?The SI joints and symphysis pubis are normally spaced and aligned. ?  ?Soft tissues are unremarkable. ?  ?IMPRESSION: ?Negative. ?  ?  ?Electronically Signed ?  By: Marland Kitchen M.D. ?  On: 05/01/2017 17:53 ?I, 07/01/2017, personally (independently) visualized and performed the interpretation of the images attached in this note. ? ? ? ? ? ?Assessment and Plan: ?51 y.o. female with acute exacerbation of chronic right hip pain thought to be due to piriformis muscle spasm and dysfunction.  She has some of the components of piriformis syndrome without the radiating pain down the leg component.  Plan to treat with physical therapy referral.  She asked about steroids.  I think oral steroids are safer to use in this situation than injection.  We will use oral prednisone as she has done well with this previously.  I recommended an off loader cushion especially with driving her car.  Recheck back as needed. ? ? ?PDMP not reviewed this encounter. ?Orders Placed This Encounter  ?Procedures  ? Ambulatory referral to Physical Therapy  ?  Referral Priority:   Routine  ?  Referral Type:   Physical Medicine  ?  Referral Reason:   Specialty Services Required  ?  Requested Specialty:   Physical Therapy  ?  Number of Visits Requested:   1  ? ?Meds ordered this encounter  ?Medications  ?  predniSONE (DELTASONE) 50 MG tablet  ?  Sig: Take 1 pill daily for 5 days  ?  Dispense:  5 tablet  ?  Refill:  0  ? ? ? ?Discussed warning signs or symptoms. Please see discharge instructions. Patient expresses understanding. ? ? ?The above documentation has been reviewed and is accurate and complete Clementeen Graham, M.D. ? ? ?

## 2022-01-02 NOTE — Patient Instructions (Addendum)
Thank you for coming in today.  ? ?I've referred you to Physical Therapy.  Let us know if you don't hear from them in one week.  ? ?I've sent a prescription for prednisone to your pharmacy.  ? ?Recheck back as needed ? ? ?

## 2022-01-10 ENCOUNTER — Other Ambulatory Visit: Payer: Self-pay | Admitting: Family Medicine

## 2022-01-24 DIAGNOSIS — N939 Abnormal uterine and vaginal bleeding, unspecified: Secondary | ICD-10-CM | POA: Diagnosis not present

## 2022-03-26 DIAGNOSIS — S4991XA Unspecified injury of right shoulder and upper arm, initial encounter: Secondary | ICD-10-CM | POA: Diagnosis not present

## 2022-03-26 DIAGNOSIS — M25521 Pain in right elbow: Secondary | ICD-10-CM | POA: Diagnosis not present

## 2022-03-26 DIAGNOSIS — M79601 Pain in right arm: Secondary | ICD-10-CM | POA: Diagnosis not present

## 2022-03-26 DIAGNOSIS — M7711 Lateral epicondylitis, right elbow: Secondary | ICD-10-CM | POA: Diagnosis not present

## 2022-03-28 ENCOUNTER — Other Ambulatory Visit: Payer: Self-pay | Admitting: *Deleted

## 2022-03-28 MED ORDER — BUPROPION HCL ER (XL) 300 MG PO TB24: ORAL_TABLET | ORAL | 0 refills | Status: DC

## 2022-04-11 ENCOUNTER — Ambulatory Visit (INDEPENDENT_AMBULATORY_CARE_PROVIDER_SITE_OTHER): Payer: BC Managed Care – PPO | Admitting: Behavioral Health

## 2022-04-11 DIAGNOSIS — F339 Major depressive disorder, recurrent, unspecified: Secondary | ICD-10-CM | POA: Diagnosis not present

## 2022-04-13 DIAGNOSIS — M25421 Effusion, right elbow: Secondary | ICD-10-CM | POA: Diagnosis not present

## 2022-04-13 DIAGNOSIS — S53441A Ulnar collateral ligament sprain of right elbow, initial encounter: Secondary | ICD-10-CM | POA: Diagnosis not present

## 2022-04-13 DIAGNOSIS — M948X2 Other specified disorders of cartilage, upper arm: Secondary | ICD-10-CM | POA: Diagnosis not present

## 2022-04-13 DIAGNOSIS — S5321XA Traumatic rupture of right radial collateral ligament, initial encounter: Secondary | ICD-10-CM | POA: Diagnosis not present

## 2022-04-13 DIAGNOSIS — X58XXXA Exposure to other specified factors, initial encounter: Secondary | ICD-10-CM | POA: Diagnosis not present

## 2022-04-13 DIAGNOSIS — M778 Other enthesopathies, not elsewhere classified: Secondary | ICD-10-CM | POA: Diagnosis not present

## 2022-04-13 DIAGNOSIS — M7989 Other specified soft tissue disorders: Secondary | ICD-10-CM | POA: Diagnosis not present

## 2022-04-13 DIAGNOSIS — S53431A Radial collateral ligament sprain of right elbow, initial encounter: Secondary | ICD-10-CM | POA: Diagnosis not present

## 2022-04-13 DIAGNOSIS — S56511A Strain of other extensor muscle, fascia and tendon at forearm level, right arm, initial encounter: Secondary | ICD-10-CM | POA: Diagnosis not present

## 2022-04-13 DIAGNOSIS — M25521 Pain in right elbow: Secondary | ICD-10-CM | POA: Diagnosis not present

## 2022-04-13 DIAGNOSIS — M79601 Pain in right arm: Secondary | ICD-10-CM | POA: Diagnosis not present

## 2022-04-19 DIAGNOSIS — Z6826 Body mass index (BMI) 26.0-26.9, adult: Secondary | ICD-10-CM | POA: Diagnosis not present

## 2022-04-19 DIAGNOSIS — M25521 Pain in right elbow: Secondary | ICD-10-CM | POA: Diagnosis not present

## 2022-04-19 DIAGNOSIS — S4991XA Unspecified injury of right shoulder and upper arm, initial encounter: Secondary | ICD-10-CM | POA: Diagnosis not present

## 2022-04-19 DIAGNOSIS — M7711 Lateral epicondylitis, right elbow: Secondary | ICD-10-CM | POA: Diagnosis not present

## 2022-04-23 NOTE — Progress Notes (Signed)
[facility] Counselor Initial Adult Exam  Name: Ashley Allen Date: 04/23/2022 MRN: [id number] DOB: [birth year] PCP: Default, Provider, MD  Time spent: 50 min  Guardian/Payee:  Self    Paperwork requested: No   Reason for Visit /Presenting Problem: Dep relating to F Hx & current situation w/siblings living w/Mother  Mental Status Exam: Appearance:   Fairly Groomed     Behavior:  Appropriate  Motor:  Normal  Speech/Language:   Normal Rate  Affect:  Appropriate  Mood:  anxious  Thought process:  normal  Thought content:    WNL  Sensory/Perceptual disturbances:    WNL  Orientation:  oriented to person, place, and time/date  Attention:  Good  Concentration:  Good  Memory:  WNL  Fund of knowledge:   Good  Insight:    Good  Judgment:   Good  Impulse Control:  Good   Appearance: Fairly Groomed    Behavior: Appropriate Motor: Normal Speech/Language: Normal Rate Affect: Appropriate Mood: anxious Thought process: normal Thought content: WNL Sensory/Perceptual disturbances: WNL Orientation: oriented to person, place, and time/date Attention: Good Concentration: Good Memory: WNL Fund of knowledge: Good Insight: Good Judgment: Good Impulse Control: Good  Reported Symptoms:  anx/dep due to F Hx & current dysfunctions  Risk Assessment: Danger to Self:  No Self-injurious Behavior: No Danger to Others: No Duty to Warn:no Physical Aggression / Violence:No  Access to Firearms a concern: No  Gang Involvement:No  Patient / guardian was educated about steps to take if suicide or homicide risk level increases between visits: no While future psychiatric events cannot be accurately predicted, the patient does not currently require acute inpatient psychiatric care and does not currently meet [facility] involuntary commitment criteria.  Substance Abuse History: Current substance abuse: No     Past Psychiatric History:   Previous psychological history is  significant for depression, trauma Outpatient Providers: unk History of Psych Hospitalization:  unk Psychological Testing:  n/a    Abuse History:  Victim of: No.,  n/a    Report needed: No. Victim of Neglect:No. Perpetrator of  n/a   Witness / Exposure to Domestic Violence: No   Protective Services Involvement: No  Witness to [employer] Violence:  No   Family History:  Family History  Problem Relation Age of Onset   Alcohol abuse Father    Arthritis Father    Heart disease Father    Hypertension Father    Emphysema Father        heavy smoker   Arthritis Mother    Hyperlipidemia Mother    Hypertension Mother    Uterine cancer Other    Colon cancer Other    Heart attack Paternal Grandmother        while pregnant   Cancer Other        Aunts   Colon cancer Paternal Aunt    Stomach cancer Maternal Aunt        spread to colon    Cervical cancer Paternal Aunt    High blood pressure Sister    High Cholesterol Sister    High blood pressure Brother    Alcohol abuse Brother    Stroke Maternal Grandmother 61   Heart attack Maternal Grandfather 50   Liver cancer Maternal Aunt    High blood pressure Sister    High Cholesterol Sister    High blood pressure Brother    Obesity Brother    High blood pressure Brother    Stroke Paternal Uncle 50   Stroke  Paternal Uncle 50    Living situation: the patient lives with their family; Spouse known since they were 11yo-Husb is Alfredo Bach; he works as a Theatre manager @ his own [employer]  Sexual Orientation: Straight  Relationship Status: married  Name of spouse / other: Alfredo Bach If a parent, number of children / ages: n/a  Support Systems: n/a  Surveyor, quantity Stress:  No   Income/Employment/Disability: Husb's work supports Contractor: No   Educational History: Education: high school diploma/GED  Religion/Sprituality/World View: Raised Pentecostal; now Non-Denominational  Any cultural differences that may affect /  interfere with treatment:  [employer] is currently being "thoughtless"  Recreation/Hobbies: music & reads  Stressors: Marital or family conflict  - Siblings are causing disruption in F dynamics; of 5 Adult Siblings, 4 reside w/Mother in [place]  Strengths: Friends, Warehouse manager, and Spirituality  Barriers:  None noted   Legal History: Pending legal issue / charges: The patient has no significant history of legal issues. 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 Past Medical History:  Diagnosis Date   Anxiety    Chicken pox    Depression    GERD (gastroesophageal reflux disease)    Hx of melanoma of skin 06/20/2017   Sees dermatologist   Recurrent upper respiratory infection (URI)    Sleep apnea    Thyroid nodule    benign   Ulcer    UTI (urinary tract infection)     Past Surgical History:  Procedure Laterality Date   BREAST ENHANCEMENT SURGERY  2007   NASAL SINUS SURGERY  ?2010    Medications: Current Outpatient Medications  Medication Sig Dispense Refill   buPROPion (WELLBUTRIN XL) 300 MG 24 hr tablet TAKE 1 TABLET BY MOUTH EVERY DAY IN THE MORNING 90 tablet 0   Cholecalciferol (VITAMIN D3) 25 MCG (1000 UT) CAPS Take 2,000 Units by mouth daily at 6 (six) AM.     gabapentin (NEURONTIN) 100 MG capsule Take 2 capsules (200 mg total) by mouth at bedtime. 180 capsule 0   levonorgestrel (MIRENA) 20 MCG/24HR IUD 1 each by Intrauterine route once.     LO LOESTRIN FE 1 MG-10 MCG / 10 MCG tablet Take 1 tablet by mouth daily.     metaxalone (SKELAXIN) 800 MG tablet Take 800 mg by mouth 3 (three) times daily.     Olopatadine-Mometasone (RYALTRIS) X543819 MCG/ACT SUSP Place 1-2 sprays into the nose in the morning and at bedtime. 29 g 5   predniSONE (DELTASONE) 50 MG tablet Take 1 pill daily for 5 days 5 tablet 0   valACYclovir (VALTREX) 500 MG tablet TAKE ONE TABLET BY MOUTH TWICE DAILY X 3 DAYS ONSET SYMPTOMS  30 tablet 0   No current facility-administered medications for this visit.    Allergies  Allergen Reactions   Sulfa Antibiotics Hives, Itching and Rash   Bacitracin-Polymyxin B Other (See Comments) and Rash    Polysporin creates blistering reaction Blisters Polysporin creates blistering reaction blisters    Sulfonamide Derivatives     REACTION: Itching   Cyclobenzaprine Itching    Diagnoses:  No diagnosis found.  Plan of Care: Process FOO issues & Hx related to current functioning as related to Siblings & dysfunction in the home w/Mother   Deneise Lever, LMFT

## 2022-04-23 NOTE — Progress Notes (Signed)
                Jazsmine Macari L Bralen Wiltgen, LMFT 

## 2022-05-14 ENCOUNTER — Ambulatory Visit (INDEPENDENT_AMBULATORY_CARE_PROVIDER_SITE_OTHER): Payer: BC Managed Care – PPO | Admitting: Family Medicine

## 2022-05-14 ENCOUNTER — Encounter: Payer: Self-pay | Admitting: Family Medicine

## 2022-05-14 VITALS — BP 122/82 | HR 85 | Temp 97.4°F | Ht 65.0 in | Wt 163.6 lb

## 2022-05-14 DIAGNOSIS — Z23 Encounter for immunization: Secondary | ICD-10-CM

## 2022-05-14 DIAGNOSIS — Z1231 Encounter for screening mammogram for malignant neoplasm of breast: Secondary | ICD-10-CM

## 2022-05-14 DIAGNOSIS — E042 Nontoxic multinodular goiter: Secondary | ICD-10-CM

## 2022-05-14 NOTE — Progress Notes (Unsigned)
Established Patient Office Visit  Subjective   Patient ID: Ashley Allen, female    DOB: [birth year]  Age: 51 y.o. MRN: [id number]  Chief Complaint  Patient presents with   Establish Care    Patient is here to transition her care. She reports a history of thyroid nodules in the past and reports she is due for a repeat US this year. She denies any weight gain, cold intolerance or constipation. She reports she did get her breast surgery for her encapsulated implants and was told she could get a mammogram this fall. I have placed orders for both of these tests. We reviewed her medications and she reports no new symptoms or issues to discuss today.   Current Outpatient Medications  Medication Instructions   buPROPion (WELLBUTRIN XL) 300 MG 24 hr tablet TAKE 1 TABLET BY MOUTH EVERY DAY IN THE MORNING   gabapentin (NEURONTIN) 200 mg, Oral, Daily at bedtime   LO LOESTRIN FE 1 MG-10 MCG / 10 MCG tablet 1 tablet, Oral, Daily   Olopatadine-Mometasone (RYALTRIS) 665-25 MCG/ACT SUSP 1-2 sprays, Nasal, 2 times daily   valACYclovir (VALTREX) 500 MG tablet TAKE ONE TABLET BY MOUTH TWICE DAILY X 3 DAYS ONSET SYMPTOMS   Vitamin D3 2,000 Units, Oral, Daily   Patient Active Problem List   Diagnosis Date Noted   Other allergic rhinitis 07/18/2021   Allergic conjunctivitis of both eyes 07/18/2021   Mild intermittent reactive airway disease 07/18/2021   Lumbar radiculopathy, right 09/14/2020   Breast ptosis 04/07/2019   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     Objective:     BP 122/82 (BP Location: Left Arm, Patient Position: Sitting, Cuff Size: Normal)   Pulse 85   Temp (!) 97.4 F (36.3 C) (Oral)   Ht 5\' 5"  (1.651 m)   Wt 163 lb 9.6 oz (74.2 kg)   LMP 05/11/2022 (Exact Date)   SpO2 98%   BMI 27.22 kg/m  BP Readings from Last 3 Encounters:  05/14/22 122/82  01/02/22 118/82  07/18/21 126/82      Physical Exam Vitals reviewed.  Constitutional:      Appearance: Normal appearance. She is well-groomed and normal weight.  Eyes:     Extraocular Movements: Extraocular movements intact.     Pupils: Pupils are equal, round, and reactive to light.  Cardiovascular:     Rate and Rhythm: Normal rate and regular rhythm.     Heart sounds: S1 normal and S2 normal.  Pulmonary:     Effort: Pulmonary effort is normal.     Breath sounds: Normal breath sounds and air entry.  Musculoskeletal:     Cervical back: Normal range of motion and neck supple.     Right lower leg: No edema.     Left lower leg: No edema.  Skin:    General: Skin is warm and dry.  Neurological:  Mental Status: She is alert and oriented to person, place, and time. Mental status is at baseline.     Gait: Gait is intact.  Psychiatric:        Mood and Affect: Mood and affect normal.        Speech: Speech normal.        Behavior: Behavior normal.        Judgment: Judgment normal.      No results found for any visits on 05/14/22.     The 10-year ASCVD risk score (Arnett DK, et al., 2019) is: 1%    Assessment & Plan:   Problem List Items Addressed This Visit       Endocrine   Multiple thyroid nodules    Asymptomatic currently, she is due for a new US thyroid, orders placed.       Relevant Orders   US THYROID   Other Visit Diagnoses     Need for immunization against influenza    -  Primary   Relevant Orders   Flu  Vaccine QUAD 6+ mos PF IM (Fluarix Quad PF) (Completed)   Encounter for screening mammogram for malignant neoplasm of breast       Relevant Orders   MM Digital Screening       Return for return in October for annual physical.    Karie Georges, MD

## 2022-05-15 NOTE — Assessment & Plan Note (Signed)
Asymptomatic currently, she is due for a new US thyroid, orders placed.

## 2022-05-30 ENCOUNTER — Ambulatory Visit
Admission: RE | Admit: 2022-05-30 | Discharge: 2022-05-30 | Disposition: A | Discharge status: Home / Self Care | Payer: BC Managed Care – PPO | Source: Ambulatory Visit | Attending: Family Medicine | Admitting: Family Medicine

## 2022-05-30 DIAGNOSIS — E042 Nontoxic multinodular goiter: Secondary | ICD-10-CM

## 2022-05-31 NOTE — Progress Notes (Signed)
I nodule resolved, the other nodule is smaller in size, most likely benign.

## 2022-07-02 ENCOUNTER — Ambulatory Visit (INDEPENDENT_AMBULATORY_CARE_PROVIDER_SITE_OTHER): Payer: BC Managed Care – PPO | Admitting: Family Medicine

## 2022-07-02 ENCOUNTER — Encounter: Payer: Self-pay | Admitting: Family Medicine

## 2022-07-02 VITALS — BP 130/90 | HR 73 | Temp 98.4°F | Ht 65.0 in | Wt 167.1 lb

## 2022-07-02 DIAGNOSIS — Z23 Encounter for immunization: Secondary | ICD-10-CM

## 2022-07-02 DIAGNOSIS — Z0000 Encounter for general adult medical examination without abnormal findings: Secondary | ICD-10-CM | POA: Diagnosis not present

## 2022-07-02 DIAGNOSIS — Z13220 Encounter for screening for lipoid disorders: Secondary | ICD-10-CM

## 2022-07-02 DIAGNOSIS — F339 Major depressive disorder, recurrent, unspecified: Secondary | ICD-10-CM | POA: Diagnosis not present

## 2022-07-02 DIAGNOSIS — E042 Nontoxic multinodular goiter: Secondary | ICD-10-CM | POA: Diagnosis not present

## 2022-07-02 LAB — COMPREHENSIVE METABOLIC PANEL
ALT: 12 U/L (ref 0–35)
AST: 17 U/L (ref 0–37)
Albumin: 4.1 g/dL (ref 3.5–5.2)
Alkaline Phosphatase: 54 U/L (ref 39–117)
BUN: 7 mg/dL (ref 6–23)
CO2: 24 mEq/L (ref 19–32)
Calcium: 9.2 mg/dL (ref 8.4–10.5)
Chloride: 104 mEq/L (ref 96–112)
Creatinine, Ser: 0.63 mg/dL (ref 0.40–1.20)
GFR: 103.66 mL/min (ref >60.00)
Glucose, Bld: 91 mg/dL (ref 70–99)
Potassium: 3.9 mEq/L (ref 3.5–5.1)
Sodium: 136 mEq/L (ref 135–145)
Total Bilirubin: 0.4 mg/dL (ref 0.2–1.2)
Total Protein: 6.9 g/dL (ref 6.0–8.3)

## 2022-07-02 LAB — LIPID PANEL
Cholesterol: 191 mg/dL (ref 0–200)
HDL: 52.2 mg/dL (ref >39.00)
NonHDL: 139.11
Total CHOL/HDL Ratio: 4
Triglycerides: 203 mg/dL — ABNORMAL HIGH (ref 0.0–149.0)
VLDL: 40.6 mg/dL — ABNORMAL HIGH (ref 0.0–40.0)

## 2022-07-02 LAB — TSH: TSH: 2.2 u[IU]/mL (ref 0.35–5.50)

## 2022-07-02 LAB — LDL CHOLESTEROL, DIRECT: Direct LDL: 118 mg/dL

## 2022-07-02 MED ORDER — BUPROPION HCL ER (XL) 300 MG PO TB24: ORAL_TABLET | ORAL | 3 refills | Status: DC

## 2022-07-02 NOTE — Progress Notes (Signed)
Complete physical exam  Patient: Ashley Allen   DOB: 07-11-1971   51 y.o. Female  MRN: [id number]  Subjective:    Chief Complaint  Patient presents with   Annual Exam    Ashley Allen is a 51 y.o. female who presents today for a complete physical exam. She reports consuming a general diet. Home exercise routine includes walking 30 hrs per day. She generally feels well. She reports sleeping well. She does not have additional problems to discuss today.    Most recent fall risk assessment:    03/01/2016   12:59 PM  Fall Risk   Falls in the past year? No     Most recent depression screenings:    07/02/2022    8:07 AM 05/14/2022    4:05 PM  PHQ 2/9 Scores  PHQ - 2 Score 1 1  PHQ- 9 Score 5 4    Vision:Within last year and Dental: No current dental problems and Receives regular dental care  Patient Active Problem List   Diagnosis Date Noted   Other allergic rhinitis 07/18/2021   Allergic conjunctivitis of both eyes 07/18/2021   Mild intermittent reactive airway disease 07/18/2021   Lumbar radiculopathy, right 09/14/2020   Breast ptosis 04/07/2019   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   Dyslipidemia 12/31/2017   Anxiety 12/31/2017   Hx of melanoma of skin 06/20/2017   SI (sacroiliac) joint dysfunction 05/01/2017   Nonallopathic lesion of sacral region 05/01/2017   Low vitamin D level 04/10/2017   Low serum vitamin B12 04/10/2017   Multiple thyroid nodules 03/01/2016   H/O: hypothyroidism 03/01/2016   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   gabapentin (NEURONTIN) 100 MG capsule Take 200  mg by mouth at bedtime as needed.   LO LOESTRIN FE 1 MG-10 MCG / 10 MCG tablet Take 1 tablet by mouth daily.   Olopatadine-Mometasone (RYALTRIS) [id number] MCG/ACT SUSP Place 1-2 sprays into the nose in the morning and at bedtime.   valACYclovir (VALTREX) 500 MG tablet TAKE ONE TABLET BY MOUTH TWICE DAILY X 3 DAYS ONSET SYMPTOMS   [DISCONTINUED] buPROPion (WELLBUTRIN XL) 300 MG 24 hr tablet TAKE 1 TABLET BY MOUTH EVERY DAY IN THE MORNING   [DISCONTINUED] gabapentin (NEURONTIN) 100 MG capsule Take 2 capsules (200 mg total) by mouth at bedtime. (Patient taking differently: Take 200 mg by mouth as needed.)   No facility-administered medications prior to visit.    Review of Systems  HENT:  Negative for hearing loss.   Eyes:  Negative for blurred vision.  Respiratory:  Negative for shortness of breath.   Cardiovascular:  Negative for chest pain.  Gastrointestinal: Negative.   Genitourinary: Negative.   Musculoskeletal:  Negative for back pain.  Neurological:  Negative for headaches.  Psychiatric/Behavioral:  Negative for depression.           Objective:     BP (!) 130/90 (BP Location: Left Arm, Patient Position: Sitting, Cuff Size: Normal)   Pulse 73   Temp 98.4 F (36.9 C) (Oral)   Ht 5\' 5"  (1.651 m)   Wt 167  lb 1.6 oz (75.8 kg)   LMP 06/11/2022 (Approximate)   SpO2 100%   BMI 27.81 kg/m    Physical Exam Vitals reviewed.  Constitutional:      Appearance: Normal appearance. She is well-groomed and normal weight.  HENT:     Head: Normocephalic and atraumatic.     Right Ear: Tympanic membrane and ear canal normal.     Left Ear: Tympanic membrane and ear canal normal.     Mouth/Throat:     Mouth: Mucous membranes are moist.     Pharynx: Oropharynx is clear.  Eyes:     Extraocular Movements: Extraocular movements intact.     Conjunctiva/sclera: Conjunctivae normal.     Pupils: Pupils are equal, round, and reactive to light.  Neck:     Thyroid: No thyromegaly.   Cardiovascular:     Rate and Rhythm: Normal rate and regular rhythm.     Heart sounds: S1 normal and S2 normal.  Pulmonary:     Effort: Pulmonary effort is normal.     Breath sounds: Normal breath sounds and air entry.  Abdominal:     General: Abdomen is flat. Bowel sounds are normal.     Palpations: Abdomen is soft.  Musculoskeletal:        General: Normal range of motion.     Cervical back: Normal range of motion and neck supple.     Right lower leg: No edema.     Left lower leg: No edema.  Skin:    General: Skin is warm and dry.  Neurological:     Mental Status: She is alert and oriented to person, place, and time. Mental status is at baseline.     Gait: Gait is intact.  Psychiatric:        Mood and Affect: Mood and affect normal.        Speech: Speech normal.        Behavior: Behavior normal.        Judgment: Judgment normal.      No results found for any visits on 07/02/22.     Assessment & Plan:    Routine Health Maintenance and Physical Exam  Immunization History  Administered Date(s) Administered   Influenza Split 07/03/2012   Influenza,inj,Quad PF,6+ Mos 07/20/2015, 07/02/2016, 06/20/2017, 07/29/2018, 05/27/2020, 06/19/2021, 05/14/2022   Influenza-Unspecified 07/22/2013   PFIZER(Purple Top)SARS-COV-2 Vaccination 12/31/2019, 01/25/2020, 11/01/2020, 06/23/2021   Td 09/25/1995   Tdap 10/30/2012    Health Maintenance  Topic Date Due   COVID-19 Vaccine (5 - Pfizer risk series) 07/18/2022 (Originally 08/18/2021)   MAMMOGRAM  08/14/2022 (Originally 03/22/2020)   Zoster Vaccines- Shingrix (1 of 2) 10/02/2022 (Originally 05/23/1991)   PAP SMEAR-Modifier  05/28/2023   COLONOSCOPY (Pts 45-109yrs Insurance coverage will need to be confirmed)  10/20/2026   TETANUS/TDAP  03/13/2030   INFLUENZA VACCINE  Completed   Hepatitis C Screening  Completed   HIV Screening  Addressed   HPV VACCINES  Aged Out    Discussed health benefits of physical activity, and encouraged  her to engage in regular exercise appropriate for her age and condition.  Problem List Items Addressed This Visit       Endocrine   Multiple thyroid nodules    US thyroid was reviewed, nodules appear to be involuting indicating benign etiology, will check annual TSH today for surveillance.      Relevant Orders   TSH     Other   Depression, recurrent ([place])    Sx under good control on wellbutrin  300 mg daily, will refill today      Relevant Medications   buPROPion (WELLBUTRIN XL) 300 MG 24 hr tablet   Other Visit Diagnoses     Immunization due    -  Primary   Relevant Orders   Varicella-zoster vaccine IM   Lipid screening       Relevant Orders   Lipid Panel   Healthy adult on routine physical examination       Relevant Orders   CMP   Routine general medical examination at a health care facility      Normal physical exam findings today, will order her annual bloodwork, RTC every year for annual exam.    Return in 1 year (on 07/03/2023) for Annual Physical Exam.     Karie Georges, MD

## 2022-07-02 NOTE — Assessment & Plan Note (Signed)
US thyroid was reviewed, nodules appear to be involuting indicating benign etiology, will check annual TSH today for surveillance.

## 2022-07-02 NOTE — Patient Instructions (Signed)

## 2022-07-02 NOTE — Assessment & Plan Note (Signed)
Sx under good control on wellbutrin 300 mg daily, will refill today

## 2022-07-12 ENCOUNTER — Ambulatory Visit (INDEPENDENT_AMBULATORY_CARE_PROVIDER_SITE_OTHER): Payer: BC Managed Care – PPO

## 2022-07-12 DIAGNOSIS — Z23 Encounter for immunization: Secondary | ICD-10-CM | POA: Diagnosis not present

## 2022-07-13 ENCOUNTER — Ambulatory Visit: Payer: BC Managed Care – PPO

## 2022-08-13 DIAGNOSIS — Z85828 Personal history of other malignant neoplasm of skin: Secondary | ICD-10-CM | POA: Diagnosis not present

## 2022-08-13 DIAGNOSIS — D225 Melanocytic nevi of trunk: Secondary | ICD-10-CM | POA: Diagnosis not present

## 2022-08-13 DIAGNOSIS — L738 Other specified follicular disorders: Secondary | ICD-10-CM | POA: Diagnosis not present

## 2022-08-13 DIAGNOSIS — Z85820 Personal history of malignant melanoma of skin: Secondary | ICD-10-CM | POA: Diagnosis not present

## 2022-08-15 IMAGING — DX DG LUMBAR SPINE 2-3V
3 series · 3 of 3 positions shown · non-contrast
Comparison: Lumbar spine radiographs 05/11/2017

CLINICAL DATA: Lumbar spine pain. Occasional radiation to the right
thigh for 8 months.

EXAM:
LUMBAR SPINE - 2-3 VIEW

[l-spine ap]
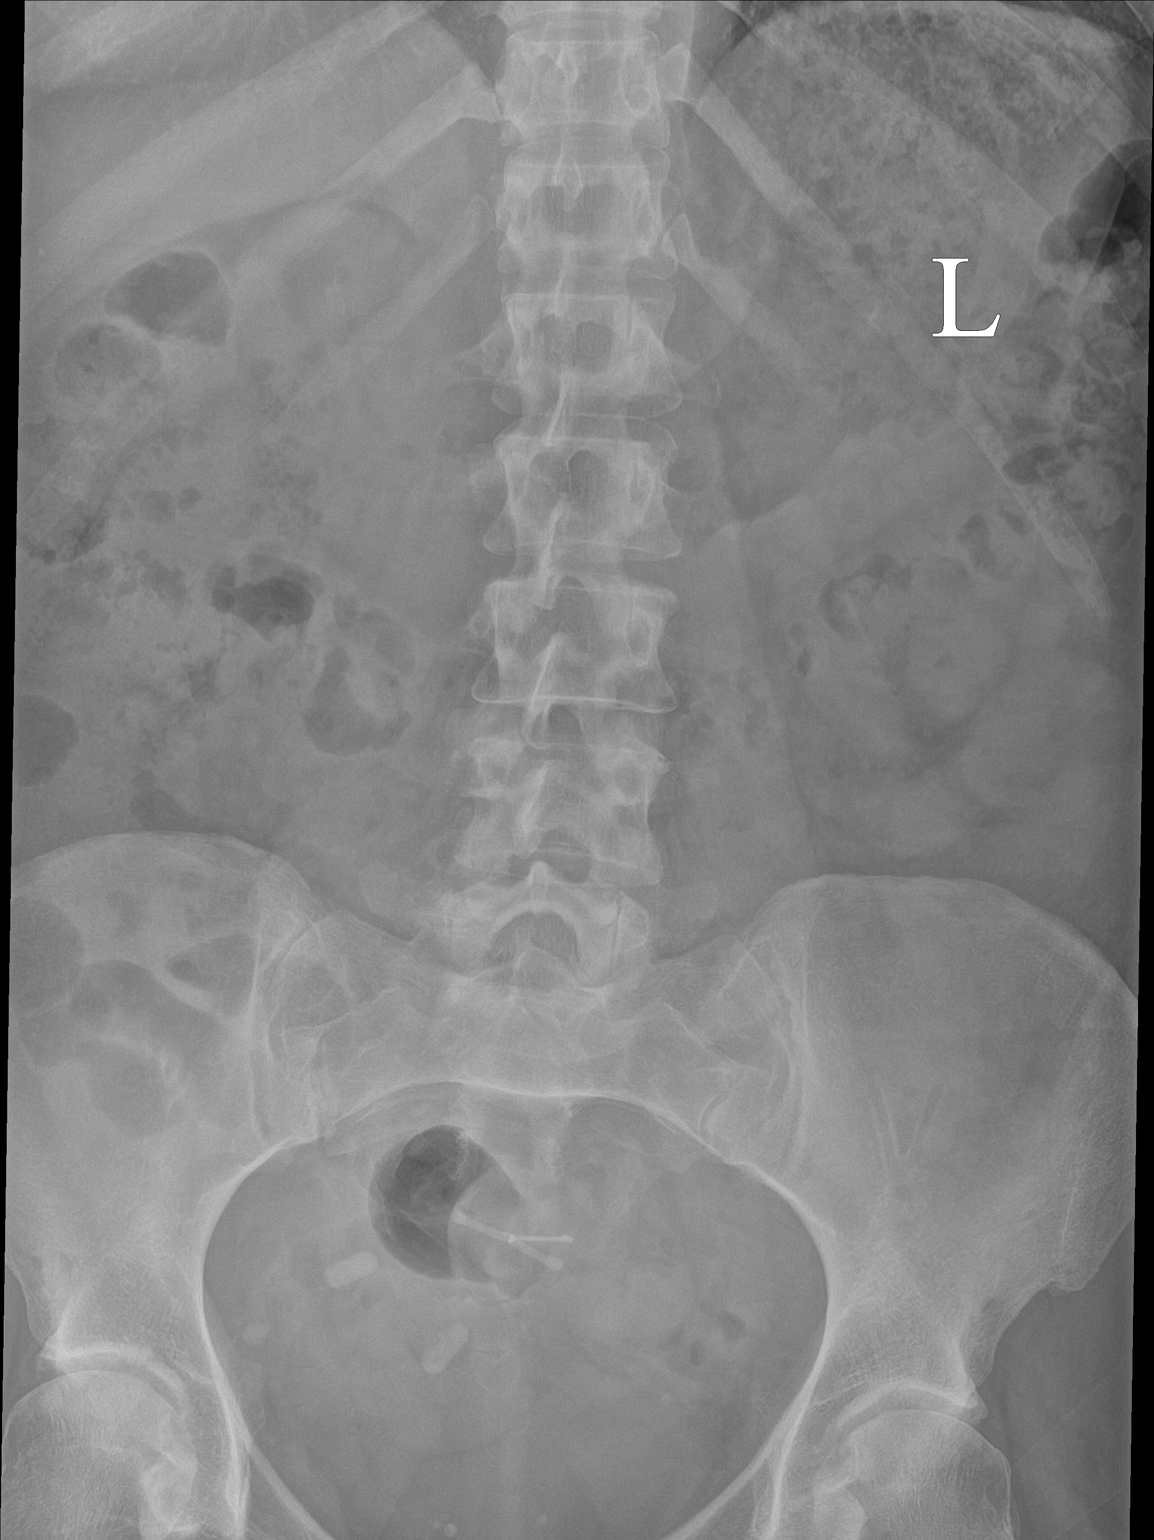

[l-spine lateral (1 of 2)]
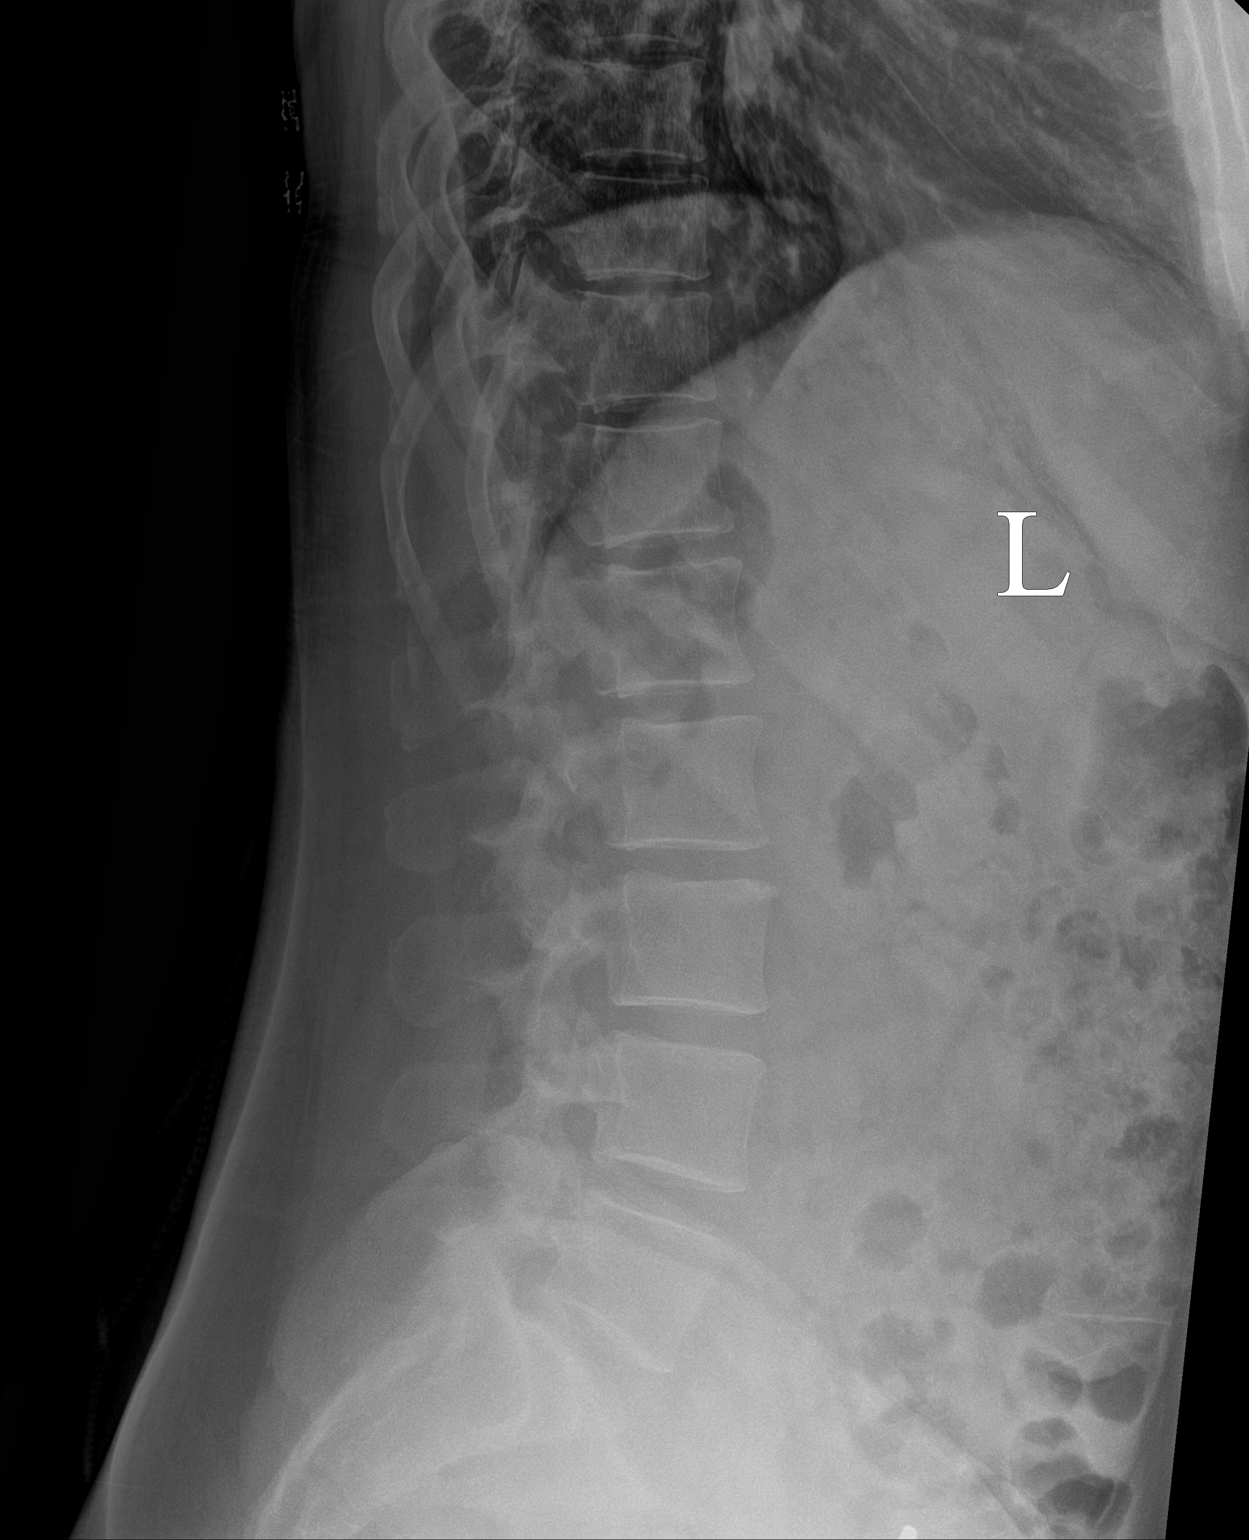

[l-spine lateral (2 of 2)]
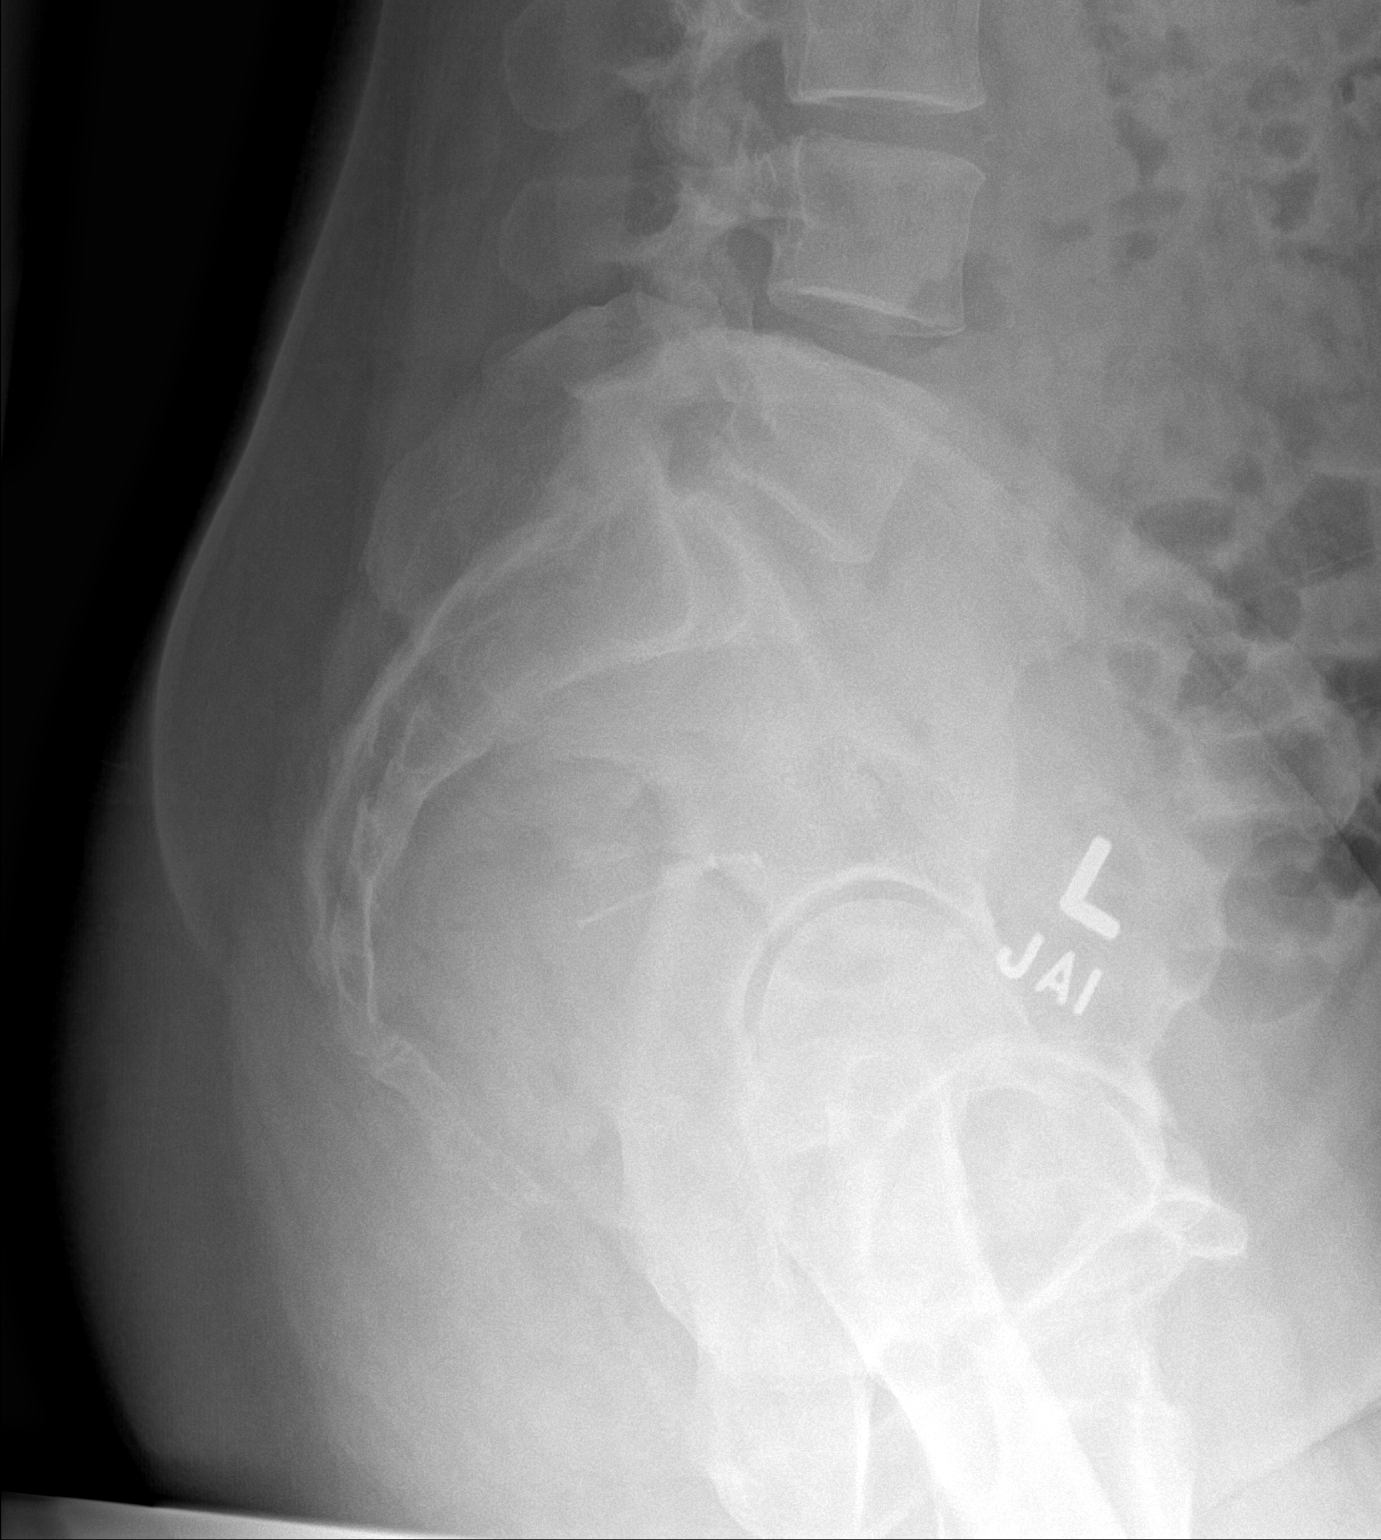

[3 of 3 positions shown; findings below may reference images not displayed]

FINDINGS: There are 5 lumbar type vertebral bodies. The alignment is normal.
The disc spaces are preserved. No evidence of acute fracture or pars
defect. No significant facet arthropathy. Intrauterine device noted.
IMPRESSION: Normal lumbar spine radiographs.

## 2022-09-06 ENCOUNTER — Ambulatory Visit: Payer: BC Managed Care – PPO

## 2022-09-12 ENCOUNTER — Ambulatory Visit (INDEPENDENT_AMBULATORY_CARE_PROVIDER_SITE_OTHER): Payer: BC Managed Care – PPO | Admitting: *Deleted

## 2022-09-12 DIAGNOSIS — Z23 Encounter for immunization: Secondary | ICD-10-CM | POA: Diagnosis not present

## 2022-10-05 ENCOUNTER — Encounter: Payer: Self-pay | Admitting: Family Medicine

## 2022-10-05 DIAGNOSIS — F339 Major depressive disorder, recurrent, unspecified: Secondary | ICD-10-CM

## 2022-10-08 MED ORDER — BUPROPION HCL ER (XL) 150 MG PO TB24: ORAL_TABLET | ORAL | 0 refills | Status: DC

## 2022-10-12 DIAGNOSIS — M9902 Segmental and somatic dysfunction of thoracic region: Secondary | ICD-10-CM | POA: Diagnosis not present

## 2022-10-12 DIAGNOSIS — M9901 Segmental and somatic dysfunction of cervical region: Secondary | ICD-10-CM | POA: Diagnosis not present

## 2022-10-12 DIAGNOSIS — M546 Pain in thoracic spine: Secondary | ICD-10-CM | POA: Diagnosis not present

## 2022-10-12 DIAGNOSIS — M542 Cervicalgia: Secondary | ICD-10-CM | POA: Diagnosis not present

## 2022-10-28 ENCOUNTER — Other Ambulatory Visit: Payer: Self-pay | Admitting: Family Medicine

## 2022-10-28 DIAGNOSIS — F339 Major depressive disorder, recurrent, unspecified: Secondary | ICD-10-CM

## 2022-10-29 DIAGNOSIS — F411 Generalized anxiety disorder: Secondary | ICD-10-CM | POA: Diagnosis not present

## 2022-11-24 ENCOUNTER — Other Ambulatory Visit: Payer: Self-pay | Admitting: Family Medicine

## 2022-11-24 DIAGNOSIS — F339 Major depressive disorder, recurrent, unspecified: Secondary | ICD-10-CM

## 2022-12-26 DIAGNOSIS — F411 Generalized anxiety disorder: Secondary | ICD-10-CM | POA: Diagnosis not present

## 2023-01-03 DIAGNOSIS — J019 Acute sinusitis, unspecified: Secondary | ICD-10-CM | POA: Diagnosis not present

## 2023-01-06 DIAGNOSIS — I10 Essential (primary) hypertension: Secondary | ICD-10-CM | POA: Diagnosis not present

## 2023-01-06 DIAGNOSIS — J3089 Other allergic rhinitis: Secondary | ICD-10-CM | POA: Diagnosis not present

## 2023-01-06 DIAGNOSIS — R059 Cough, unspecified: Secondary | ICD-10-CM | POA: Diagnosis not present

## 2023-01-06 DIAGNOSIS — Z6827 Body mass index (BMI) 27.0-27.9, adult: Secondary | ICD-10-CM | POA: Diagnosis not present

## 2023-01-15 ENCOUNTER — Other Ambulatory Visit: Payer: Self-pay | Admitting: Family Medicine

## 2023-01-15 DIAGNOSIS — Z1231 Encounter for screening mammogram for malignant neoplasm of breast: Secondary | ICD-10-CM

## 2023-02-07 ENCOUNTER — Ambulatory Visit
Admission: RE | Admit: 2023-02-07 | Discharge: 2023-02-07 | Disposition: A | Discharge status: Home / Self Care | Payer: BC Managed Care – PPO | Source: Ambulatory Visit

## 2023-02-07 DIAGNOSIS — Z1231 Encounter for screening mammogram for malignant neoplasm of breast: Secondary | ICD-10-CM | POA: Diagnosis not present

## 2023-02-13 ENCOUNTER — Encounter: Payer: Self-pay | Admitting: Family Medicine

## 2023-02-13 DIAGNOSIS — Z8249 Family history of ischemic heart disease and other diseases of the circulatory system: Secondary | ICD-10-CM

## 2023-02-14 NOTE — Telephone Encounter (Signed)
Ok to place referral to [facility] health heart care in [place], I am note sure there is a female cardiologist there, may use family history of early cardiac death/disease as the diagnosis.

## 2023-03-14 ENCOUNTER — Encounter: Payer: Self-pay | Admitting: Internal Medicine

## 2023-03-14 ENCOUNTER — Ambulatory Visit: Payer: BC Managed Care – PPO | Attending: Internal Medicine | Admitting: Internal Medicine

## 2023-03-14 VITALS — BP 144/80 | HR 75 | Ht 65.0 in | Wt 166.2 lb

## 2023-03-14 DIAGNOSIS — Z8241 Family history of sudden cardiac death: Secondary | ICD-10-CM | POA: Insufficient documentation

## 2023-03-14 DIAGNOSIS — Z8249 Family history of ischemic heart disease and other diseases of the circulatory system: Secondary | ICD-10-CM | POA: Insufficient documentation

## 2023-03-14 NOTE — Patient Instructions (Addendum)
PRNMedication Instructions:  Your physician recommends that you continue on your current medications as directed. Please refer to the Current Medication list given to you today.  *If you need a refill on your cardiac medications before your next appointment, please call your pharmacy*   Lab Work: Lipid Panel- Fasting- Nothing to eat/drink at least 6 hours prior to having lab drawn. If you have labs (blood work) drawn today and your tests are completely normal, you will receive your results only by: MyChart Message (if you have MyChart) OR A paper copy in the mail If you have any lab test that is abnormal or we need to change your treatment, we will call you to review the results.   Testing/Procedures: Your physician has requested that you have cardiac CT. Cardiac computed tomography (CT) is a painless test that uses an x-ray machine to take clear, detailed pictures of your heart. For further information please visit [email or web address]      Follow-Up: At [facility], you and your health needs are our priority.  As part of our continuing mission to provide you with exceptional heart care, we have created designated Provider Care Teams.  These Care Teams include your primary Cardiologist (physician) and Advanced Practice Providers (APPs -  Physician Assistants and Nurse Practitioners) who all work together to provide you with the care you need, when you need it.  We recommend signing up for the patient portal called "MyChart".  Sign up information is provided on this After Visit Summary.  MyChart is used to connect with patients for Virtual Visits (Telemedicine).  Patients are able to view lab/test results, encounter notes, upcoming appointments, etc.  Non-urgent messages can be sent to your provider as well.   To learn more about what you can do with MyChart, go to [email or web address].    Your next appointment:   Follow up as needed.   Provider:   Luane School, MD     Other Instructions

## 2023-03-14 NOTE — Progress Notes (Signed)
Cardiology Office Note  Date: 03/14/2023   ID: Mollie Devey, DOB 10/11/1970, MRN [id number]  PCP:  Karie Georges, MD  Cardiologist:  None Electrophysiologist:  None   Reason for Office Visit: Family history of heart issues   History of Present Illness: Ashley Allen is a 52 y.o. female known to have mild OSA was referred to cardiology clinic for evaluation of CAD screening.  Patient has a strong family history of sudden cardiac death, grandparents passed away with sudden cardiac death/heart attack.  Couple other family members also passed away in their 30s and 60s recently. Her mother had congestive heart failure.  Hence patient wanted to get on the top of her heart health and get her heart checked out. Denies any history of angina, DOE, dizziness, palpitations, syncope and leg swelling.  Former smoker, quit in 2011.  Past Medical History:  Diagnosis Date   Anxiety    Chicken pox    Depression    GERD (gastroesophageal reflux disease)    Hx of melanoma of skin 06/20/2017   Sees dermatologist   Recurrent upper respiratory infection (URI)    Sleep apnea    Thyroid nodule    benign   Ulcer    UTI (urinary tract infection)     Past Surgical History:  Procedure Laterality Date   AUGMENTATION MAMMAPLASTY Bilateral 2023   BREAST ENHANCEMENT SURGERY  09/24/2005   NASAL SINUS SURGERY  ?2010    Current Outpatient Medications  Medication Sig Dispense Refill   buPROPion (WELLBUTRIN XL) 150 MG 24 hr tablet TAKE 1 TABLET (150 MG TOTAL) BY MOUTH DAILY FOR 14 DAYS, THEN 1 TABLET (150 MG TOTAL) EVERY OTHER DAY FOR 14 DAYS. 63 tablet 2   Cholecalciferol (VITAMIN D3) 25 MCG (1000 UT) CAPS Take 2,000 Units by mouth daily at 6 (six) AM.     fluticasone (FLONASE ALLERGY RELIEF) 50 MCG/ACT nasal spray Place 2 sprays into both nostrils daily.     gabapentin (NEURONTIN) 100 MG capsule Take 200 mg by mouth at bedtime as needed.     LO LOESTRIN FE 1 MG-10 MCG / 10 MCG tablet Take 1 tablet  by mouth daily.     Loratadine (CLARITIN PO) Take by mouth.     valACYclovir (VALTREX) 500 MG tablet TAKE ONE TABLET BY MOUTH TWICE DAILY X 3 DAYS ONSET SYMPTOMS 30 tablet 0   No current facility-administered medications for this visit.   Allergies:  Sulfa antibiotics, Bacitracin-polymyxin b, Sulfonamide derivatives, and Cyclobenzaprine   Social History: The patient  reports that she quit smoking about 13 years ago. Her smoking use included cigarettes. She has never used smokeless tobacco. She reports current alcohol use. She reports that she does not use drugs.   Family History: The patient's family history includes Alcohol abuse in her brother and father; Arthritis in her father and mother; Cancer in an other family member; Cervical cancer in her paternal aunt; Colon cancer in her paternal aunt and another family member; Emphysema in her father; Heart attack in her paternal grandmother; Heart attack (age of onset: 41) in her maternal grandfather; Heart disease in her father; High Cholesterol in her sister and sister; High blood pressure in her brother, brother, brother, sister, and sister; Hyperlipidemia in her mother; Hypertension in her father and mother; Liver cancer in her maternal aunt; Obesity in her brother; Stomach cancer in her maternal aunt; Stroke (age of onset: 76) in her paternal uncle and paternal uncle; Stroke (age of onset: 41) in her  maternal grandmother; Uterine cancer in an other family member.   ROS:  Please see the history of present illness. Otherwise, complete review of systems is positive for none  All other systems are reviewed and negative.   Physical Exam: VS:  BP (!) 144/80   Ht 5\' 5"  (1.651 m)   Wt 166 lb 3.2 oz (75.4 kg)   SpO2 98%   BMI 27.66 kg/m , BMI Body mass index is 27.66 kg/m.  Wt Readings from Last 3 Encounters:  03/14/23 166 lb 3.2 oz (75.4 kg)  07/02/22 167 lb 1.6 oz (75.8 kg)  05/14/22 163 lb 9.6 oz (74.2 kg)    General: Patient appears  comfortable at rest. HEENT: Conjunctiva and lids normal, oropharynx clear with moist mucosa. Neck: Supple, no elevated JVP or carotid bruits, no thyromegaly. Lungs: Clear to auscultation, nonlabored breathing at rest. Cardiac: Regular rate and rhythm, no S3 or significant systolic murmur, no pericardial rub. Abdomen: Soft, nontender, no hepatomegaly, bowel sounds present, no guarding or rebound. Extremities: No pitting edema, distal pulses 2+. Skin: Warm and dry. Musculoskeletal: No kyphosis. Neuropsychiatric: Alert and oriented x3, affect grossly appropriate.  Recent Labwork: 07/02/2022: ALT 12; AST 17; BUN 7; Creatinine, Ser 0.63; Potassium 3.9; Sodium 136; TSH 2.20     Component Value Date/Time   CHOL 191 07/02/2022 0838   TRIG 203.0 (H) 07/02/2022 0838   HDL 52.20 07/02/2022 0838   CHOLHDL 4 07/02/2022 0838   VLDL 40.6 (H) 07/02/2022 0838   LDLCALC 117 (H) 06/21/2021 0948   LDLCALC 95 05/27/2020 1101   LDLDIRECT 118.0 07/02/2022 0838    Assessment and Plan:  Patient is a 52 year old F known to have mild OSA not on CPAP was referred to cardiology clinic for evaluation of CAD screening.  # Family history of sudden cardiac death # Family history of ? premature CAD -EKG showed NSR, normal QTc interval and no preexcitation -Obtain lipid panel and lipoprotein a -Obtain CT calcium scoring of coronaries  # Mild OSA: Was told to use oral appliance but patient does not like it.   I have spent a total of 30 minutes with patient reviewing chart, EKGs, labs and examining patient as well as establishing an assessment and plan that was discussed with the patient.  > 50% of time was spent in direct patient care.    Medication Adjustments/Labs and Tests Ordered: Current medicines are reviewed at length with the patient today.  Concerns regarding medicines are outlined above.   Tests Ordered: Orders Placed This Encounter  Procedures   EKG 12-Lead    Medication Changes: No orders of  the defined types were placed in this encounter.   Disposition:  Follow up  PRN  Signed Lyle Niblett Verne Spurr, MD, 03/14/2023 3:22 PM    [facility] at [facility] [address] [place], [place], [place] [address]

## 2023-03-20 DIAGNOSIS — Z01419 Encounter for gynecological examination (general) (routine) without abnormal findings: Secondary | ICD-10-CM | POA: Diagnosis not present

## 2023-03-20 DIAGNOSIS — Z124 Encounter for screening for malignant neoplasm of cervix: Secondary | ICD-10-CM | POA: Diagnosis not present

## 2023-03-27 ENCOUNTER — Ambulatory Visit (HOSPITAL_COMMUNITY)
Admission: RE | Admit: 2023-03-27 | Discharge: 2023-03-27 | Disposition: A | Discharge status: Home / Self Care | Payer: BC Managed Care – PPO | Source: Ambulatory Visit | Attending: Internal Medicine | Admitting: Internal Medicine

## 2023-03-27 ENCOUNTER — Encounter (HOSPITAL_COMMUNITY): Payer: Self-pay

## 2023-03-27 DIAGNOSIS — Z8249 Family history of ischemic heart disease and other diseases of the circulatory system: Secondary | ICD-10-CM | POA: Insufficient documentation

## 2023-04-02 ENCOUNTER — Telehealth: Payer: Self-pay

## 2023-04-02 NOTE — Telephone Encounter (Signed)
Provided results, patient verbalized understanding. Copy sent to PCP

## 2023-04-02 NOTE — Telephone Encounter (Signed)
-----   Message from Marjo Bicker, MD sent at 04/01/2023  4:17 PM EDT ----- Coronary calcium score is 7 (89th percentile for age and sex matched controls).  Minimal blockages in the heart vessels.  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  Obtain CTA aorta in 1 year.  Schedule follow-up in 1 year.

## 2023-04-19 DIAGNOSIS — L820 Inflamed seborrheic keratosis: Secondary | ICD-10-CM | POA: Diagnosis not present

## 2023-04-19 DIAGNOSIS — L570 Actinic keratosis: Secondary | ICD-10-CM | POA: Diagnosis not present

## 2023-04-19 DIAGNOSIS — D485 Neoplasm of uncertain behavior of skin: Secondary | ICD-10-CM | POA: Diagnosis not present

## 2023-04-30 DIAGNOSIS — N3946 Mixed incontinence: Secondary | ICD-10-CM | POA: Diagnosis not present

## 2023-04-30 DIAGNOSIS — R278 Other lack of coordination: Secondary | ICD-10-CM | POA: Diagnosis not present

## 2023-04-30 DIAGNOSIS — M6281 Muscle weakness (generalized): Secondary | ICD-10-CM | POA: Diagnosis not present

## 2023-05-16 ENCOUNTER — Encounter: Payer: Self-pay | Admitting: Podiatry

## 2023-05-16 ENCOUNTER — Ambulatory Visit: Payer: BC Managed Care – PPO | Admitting: Podiatry

## 2023-05-16 ENCOUNTER — Ambulatory Visit (INDEPENDENT_AMBULATORY_CARE_PROVIDER_SITE_OTHER): Payer: BC Managed Care – PPO

## 2023-05-16 DIAGNOSIS — L600 Ingrowing nail: Secondary | ICD-10-CM

## 2023-05-16 DIAGNOSIS — M21612 Bunion of left foot: Secondary | ICD-10-CM

## 2023-05-16 DIAGNOSIS — M21611 Bunion of right foot: Secondary | ICD-10-CM | POA: Diagnosis not present

## 2023-05-16 DIAGNOSIS — M21619 Bunion of unspecified foot: Secondary | ICD-10-CM | POA: Diagnosis not present

## 2023-05-16 DIAGNOSIS — M79672 Pain in left foot: Secondary | ICD-10-CM | POA: Diagnosis not present

## 2023-05-16 DIAGNOSIS — M79671 Pain in right foot: Secondary | ICD-10-CM | POA: Diagnosis not present

## 2023-05-16 MED ORDER — MELOXICAM 15 MG PO TABS: 15.0000 mg | ORAL_TABLET | Freq: Every day | ORAL | 0 refills | Status: DC

## 2023-05-16 NOTE — Patient Instructions (Signed)

## 2023-05-16 NOTE — Progress Notes (Signed)
Subjective:  Patient ID: Ashley Allen, female    DOB: 1971/09/17,   MRN: [id number]  No chief complaint on file.   52 y.o. female presents for concern of bilateral bunions and left great toe ingrown toenail. Relates she has had the right bunion for years and recently noticed the left one started to have a bump. Relates they only hurt with certain shoes but relates she avoids heels and has been trying to wear better shoes. Also relates ingrown nail that she has been dealing with and would like to have fixed.  . Denies any other pedal complaints. Denies n/v/f/c.   Past Medical History:  Diagnosis Date   Anxiety    Chicken pox    Depression    GERD (gastroesophageal reflux disease)    Hx of melanoma of skin 06/20/2017   Sees dermatologist   Recurrent upper respiratory infection (URI)    Sleep apnea    Thyroid nodule    benign   Ulcer    UTI (urinary tract infection)     Objective:  Physical Exam: Vascular: DP/PT pulses 2/4 bilateral. CFT <3 seconds. Normal hair growth on digits. No edema.  Skin. No lacerations or abrasions bilateral feet. Incurvation of medial borer of left great toenail with tenderness to palpation. No erythema edema or purulence noted.  Musculoskeletal: MMT 5/5 bilateral lower extremities in DF, PF, Inversion and Eversion. Deceased ROM in DF of ankle joint. Bilateral moderate HAV deformity noted with mild tenderness to medial eminence bilateral and no pain with ROM of the first MPJ bilateral.  Neurological: Sensation intact to light touch.   Assessment:   1. Ingrown left greater toenail   2. Bilateral bunions      Plan:  Patient was evaluated and treated and all questions answered. -Xrays reviewed. No acute fractures or dislocations noted. Bilateral HAV deformity noted moderate with about 10 degree IM 1-2 angle bilateral some mild degenerative changes noted more so on the left.  -Discussed HAV and treatment options;conservative and surgical management; risks,  benefits, alternatives discussed. All patient's questions answered. -Discussed padding and wide shoe gear.   -Meloxicam provided to help with any inflammation.  -Recommend continue with good supportive shoes and inserts.  -Discussed surgical options.    Discussed ingrown toenails etiology and treatment options including procedure for removal vs conservative care.  Patient requesting removal of ingrown nail today. Procedure below.  Discussed procedure and post procedure care and patient expressed understanding.  Will follow-up in 2 weeks for nail check or sooner if any problems arise.    Procedure:  Procedure: partial Nail Avulsion of left hallux medial nail border.  Surgeon: Louann Sjogren, DPM  Pre-op Dx: Ingrown toenail without infection Post-op: Same  Place of Surgery: Office exam room.  Indications for surgery: Painful and ingrown toenail.    The patient is requesting removal of nail with chemical matrixectomy. Risks and complications were discussed with the patient for which they understand and written consent was obtained. Under sterile conditions a total of 3 mL of  1% lidocaine plain was infiltrated in a hallux block fashion. Once anesthetized, the skin was prepped in sterile fashion. A tourniquet was then applied. Next the medial aspect of hallux nail border was then sharply excised making sure to remove the entire offending nail border.  Next phenol was then applied under standard conditions and copiously irrigated. Silvadene was applied. A dry sterile dressing was applied. After application of the dressing the tourniquet was removed and there is found to be an  immediate capillary refill time to the digit. The patient tolerated the procedure well without any complications. Post procedure instructions were discussed the patient for which he verbally understood. Follow-up in two weeks for nail check or sooner if any problems are to arise. Discussed signs/symptoms of infection and  directed to call the office immediately should any occur or go directly to the emergency room. In the meantime, encouraged to call the office with any questions, concerns, changes symptoms.    Louann Sjogren, DPM

## 2023-05-22 DIAGNOSIS — H5052 Exophoria: Secondary | ICD-10-CM | POA: Diagnosis not present

## 2023-05-22 DIAGNOSIS — H5203 Hypermetropia, bilateral: Secondary | ICD-10-CM | POA: Diagnosis not present

## 2023-05-29 DIAGNOSIS — M6281 Muscle weakness (generalized): Secondary | ICD-10-CM | POA: Diagnosis not present

## 2023-05-29 DIAGNOSIS — N3946 Mixed incontinence: Secondary | ICD-10-CM | POA: Diagnosis not present

## 2023-05-29 DIAGNOSIS — R278 Other lack of coordination: Secondary | ICD-10-CM | POA: Diagnosis not present

## 2023-05-30 ENCOUNTER — Ambulatory Visit: Payer: BC Managed Care – PPO | Admitting: Podiatry

## 2023-05-31 ENCOUNTER — Other Ambulatory Visit: Payer: Self-pay | Admitting: Podiatry

## 2023-05-31 ENCOUNTER — Encounter: Payer: Self-pay | Admitting: Podiatry

## 2023-05-31 MED ORDER — CEPHALEXIN 500 MG PO CAPS: 500.0000 mg | ORAL_CAPSULE | Freq: Four times a day (QID) | ORAL | 0 refills | Status: AC

## 2023-06-10 DIAGNOSIS — D225 Melanocytic nevi of trunk: Secondary | ICD-10-CM | POA: Diagnosis not present

## 2023-06-10 DIAGNOSIS — D2261 Melanocytic nevi of right upper limb, including shoulder: Secondary | ICD-10-CM | POA: Diagnosis not present

## 2023-06-10 DIAGNOSIS — Z85820 Personal history of malignant melanoma of skin: Secondary | ICD-10-CM | POA: Diagnosis not present

## 2023-06-10 DIAGNOSIS — Z85828 Personal history of other malignant neoplasm of skin: Secondary | ICD-10-CM | POA: Diagnosis not present

## 2023-06-11 DIAGNOSIS — M6281 Muscle weakness (generalized): Secondary | ICD-10-CM | POA: Diagnosis not present

## 2023-06-11 DIAGNOSIS — R278 Other lack of coordination: Secondary | ICD-10-CM | POA: Diagnosis not present

## 2023-06-11 DIAGNOSIS — N3946 Mixed incontinence: Secondary | ICD-10-CM | POA: Diagnosis not present

## 2023-06-12 ENCOUNTER — Other Ambulatory Visit: Payer: Self-pay | Admitting: Podiatry

## 2023-06-25 DIAGNOSIS — L309 Dermatitis, unspecified: Secondary | ICD-10-CM | POA: Diagnosis not present

## 2023-06-25 DIAGNOSIS — M6281 Muscle weakness (generalized): Secondary | ICD-10-CM | POA: Diagnosis not present

## 2023-06-25 DIAGNOSIS — N3946 Mixed incontinence: Secondary | ICD-10-CM | POA: Diagnosis not present

## 2023-06-25 DIAGNOSIS — R278 Other lack of coordination: Secondary | ICD-10-CM | POA: Diagnosis not present

## 2023-06-25 DIAGNOSIS — N898 Other specified noninflammatory disorders of vagina: Secondary | ICD-10-CM | POA: Diagnosis not present

## 2023-07-04 ENCOUNTER — Ambulatory Visit: Payer: BC Managed Care – PPO | Admitting: Family Medicine

## 2023-07-04 ENCOUNTER — Other Ambulatory Visit (INDEPENDENT_AMBULATORY_CARE_PROVIDER_SITE_OTHER): Payer: BC Managed Care – PPO

## 2023-07-04 VITALS — BP 122/94 | HR 70 | Temp 98.4°F | Ht 64.0 in | Wt 166.3 lb

## 2023-07-04 DIAGNOSIS — E538 Deficiency of other specified B group vitamins: Secondary | ICD-10-CM

## 2023-07-04 DIAGNOSIS — R7989 Other specified abnormal findings of blood chemistry: Secondary | ICD-10-CM

## 2023-07-04 DIAGNOSIS — E559 Vitamin D deficiency, unspecified: Secondary | ICD-10-CM

## 2023-07-04 DIAGNOSIS — L659 Nonscarring hair loss, unspecified: Secondary | ICD-10-CM | POA: Diagnosis not present

## 2023-07-04 DIAGNOSIS — Z8639 Personal history of other endocrine, nutritional and metabolic disease: Secondary | ICD-10-CM

## 2023-07-04 DIAGNOSIS — Z0000 Encounter for general adult medical examination without abnormal findings: Secondary | ICD-10-CM | POA: Diagnosis not present

## 2023-07-04 DIAGNOSIS — Z13220 Encounter for screening for lipoid disorders: Secondary | ICD-10-CM | POA: Diagnosis not present

## 2023-07-04 LAB — CBC WITH DIFFERENTIAL/PLATELET
Basophils Absolute: 0 10*3/uL (ref 0.0–0.1)
Basophils Relative: 0.4 % (ref 0.0–3.0)
Eosinophils Absolute: 0.1 10*3/uL (ref 0.0–0.7)
Eosinophils Relative: 1.5 % (ref 0.0–5.0)
HCT: 37 % (ref 36.0–46.0)
Hemoglobin: 12 g/dL (ref 12.0–15.0)
Lymphocytes Relative: 19.9 % (ref 12.0–46.0)
Lymphs Abs: 1 10*3/uL (ref 0.7–4.0)
MCHC: 32.4 g/dL (ref 30.0–36.0)
MCV: 87.9 fL (ref 78.0–100.0)
Monocytes Absolute: 0.4 10*3/uL (ref 0.1–1.0)
Monocytes Relative: 7.3 % (ref 3.0–12.0)
Neutro Abs: 3.5 10*3/uL (ref 1.4–7.7)
Neutrophils Relative %: 70.9 % (ref 43.0–77.0)
Platelets: 269 10*3/uL (ref 150.0–400.0)
RBC: 4.21 Mil/uL (ref 3.87–5.11)
RDW: 15 % (ref 11.5–15.5)
WBC: 5 10*3/uL (ref 4.0–10.5)

## 2023-07-04 LAB — COMPREHENSIVE METABOLIC PANEL
ALT: 16 U/L (ref 0–35)
AST: 17 U/L (ref 0–37)
Albumin: 4.1 g/dL (ref 3.5–5.2)
Alkaline Phosphatase: 62 U/L (ref 39–117)
BUN: 9 mg/dL (ref 6–23)
CO2: 27 meq/L (ref 19–32)
Calcium: 9 mg/dL (ref 8.4–10.5)
Chloride: 106 meq/L (ref 96–112)
Creatinine, Ser: 0.7 mg/dL (ref 0.40–1.20)
GFR: 100.35 mL/min (ref >60.00)
Glucose, Bld: 104 mg/dL — ABNORMAL HIGH (ref 70–99)
Potassium: 3.9 meq/L (ref 3.5–5.1)
Sodium: 139 meq/L (ref 135–145)
Total Bilirubin: 0.3 mg/dL (ref 0.2–1.2)
Total Protein: 6.3 g/dL (ref 6.0–8.3)

## 2023-07-04 LAB — VITAMIN B12: Vitamin B-12: 460 pg/mL (ref 211–911)

## 2023-07-04 LAB — TSH: TSH: 1.3 u[IU]/mL (ref 0.35–5.50)

## 2023-07-04 LAB — LIPID PANEL
Cholesterol: 192 mg/dL (ref 0–200)
HDL: 53.4 mg/dL (ref >39.00)
LDL Cholesterol: 121 mg/dL — ABNORMAL HIGH (ref 0–99)
NonHDL: 138.67
Total CHOL/HDL Ratio: 4
Triglycerides: 86 mg/dL (ref 0.0–149.0)
VLDL: 17.2 mg/dL (ref 0.0–40.0)

## 2023-07-04 LAB — VITAMIN D 25 HYDROXY (VIT D DEFICIENCY, FRACTURES): VITD: 54.2 ng/mL (ref 30.00–100.00)

## 2023-07-04 LAB — FERRITIN: Ferritin: 19.1 ng/mL (ref 10.0–291.0)

## 2023-07-04 LAB — IRON: Iron: 44 ug/dL (ref 42–145)

## 2023-07-04 NOTE — Patient Instructions (Signed)

## 2023-07-04 NOTE — Progress Notes (Signed)
Complete physical exam  Patient: Ashley Allen   DOB: December 15, 1970   52 y.o. Female  MRN: [id number]  Subjective:    Chief Complaint  Patient presents with   Annual Exam    Pt states her dermatologist wanted her to check iron, ferritin and tsh lab.     Ashley Allen is a 52 y.o. female who presents today for a complete physical exam. She reports consuming a general diet. Gym/ health club routine includes cardio and light weights. She generally feels well. She reports sleeping well. She does not have additional problems to discuss today.    Most recent fall risk assessment:    07/04/2023    9:07 AM  Fall Risk   Falls in the past year? 0  Number falls in past yr: 0  Injury with Fall? 0  Risk for fall due to : No Fall Risks  Follow up Falls evaluation completed     Most recent depression screenings:    07/04/2023    9:07 AM 07/02/2022    8:07 AM  PHQ 2/9 Scores  PHQ - 2 Score 0 1  PHQ- 9 Score 1 5    Vision:Within last year and Dental: No current dental problems and Receives regular dental care  Patient Active Problem List   Diagnosis Date Noted   Family history of sudden cardiac death 04/13/2023   Family history of coronary artery disease Apr 13, 2023   Other allergic rhinitis 07/18/2021   Allergic conjunctivitis of both eyes 07/18/2021   Mild intermittent reactive airway disease 07/18/2021   Lumbar radiculopathy, right 09/14/2020   Breast ptosis 04/07/2019   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   Dyslipidemia 12/31/2017   Anxiety 12/31/2017   Hx of melanoma of skin 06/20/2017   SI (sacroiliac) joint dysfunction 05/01/2017   Nonallopathic lesion of sacral region 05/01/2017   Low vitamin D level 04/10/2017   Low serum vitamin B12 04/10/2017   Multiple thyroid nodules 03/01/2016   H/O: hypothyroidism 03/01/2016   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   Cetirizine HCl (ZYRTEC PO) Take by mouth. Alternate with claritin   Cholecalciferol (VITAMIN D3) 25 MCG (1000 UT) CAPS Take 2,000 Units by mouth daily at 6 (six) AM.   fluticasone (FLONASE ALLERGY RELIEF) 50 MCG/ACT nasal spray Place 2 sprays into both nostrils daily.   gabapentin (NEURONTIN) 100 MG capsule Take 200 mg by mouth at bedtime as needed.   meloxicam (MOBIC) 15 MG tablet TAKE 1 TABLET (15 MG TOTAL) BY MOUTH DAILY. (Patient taking differently: Take 15 mg by mouth daily. As needed)   valACYclovir (VALTREX) 500 MG tablet TAKE ONE TABLET BY MOUTH TWICE DAILY X 3 DAYS ONSET SYMPTOMS   [DISCONTINUED] JUNEL FE 1.5/30 1.5-30 MG-MCG tablet Take 1 tablet by mouth daily.   [DISCONTINUED] buPROPion (WELLBUTRIN XL) 150 MG 24 hr tablet TAKE 1 TABLET (150 MG TOTAL) BY MOUTH DAILY FOR 14 DAYS, THEN 1 TABLET (150 MG TOTAL) EVERY OTHER DAY FOR 14 DAYS.   [DISCONTINUED] LO LOESTRIN FE 1 MG-10 MCG / 10 MCG tablet Take 1 tablet by mouth daily.   [DISCONTINUED]  Loratadine (CLARITIN PO) Take by mouth. (Patient not taking: Reported on 07/04/2023)   No facility-administered medications prior to visit.    Review of Systems  HENT:  Negative for hearing loss.   Eyes:  Negative for blurred vision.  Respiratory:  Negative for shortness of breath.   Cardiovascular:  Negative for chest pain.  Gastrointestinal: Negative.   Genitourinary: Negative.   Musculoskeletal:  Negative for back pain.  Neurological:  Negative for headaches.  Psychiatric/Behavioral:  Negative for depression.   All other systems reviewed and are negative.      Objective:     BP (!) 122/94 (BP Location: Right Arm, Patient Position: Sitting, Cuff Size: Normal)   Pulse 70   Temp  98.4 F (36.9 C) (Oral)   Ht 5\' 4"  (1.626 m)   Wt 166 lb 4.8 oz (75.4 kg)   LMP 06/28/2023 (Exact Date)   SpO2 98%   BMI 28.55 kg/m    Physical Exam Vitals reviewed.  Constitutional:      Appearance: Normal appearance. She is well-groomed and normal weight.  HENT:     Right Ear: Tympanic membrane and ear canal normal.     Left Ear: Tympanic membrane and ear canal normal.     Mouth/Throat:     Mouth: Mucous membranes are moist.     Pharynx: No posterior oropharyngeal erythema.  Eyes:     Conjunctiva/sclera: Conjunctivae normal.  Neck:     Thyroid: No thyromegaly.  Cardiovascular:     Rate and Rhythm: Normal rate and regular rhythm.     Pulses: Normal pulses.     Heart sounds: S1 normal and S2 normal.  Pulmonary:     Effort: Pulmonary effort is normal.     Breath sounds: Normal breath sounds and air entry.  Abdominal:     General: Abdomen is flat. Bowel sounds are normal.     Palpations: Abdomen is soft.  Musculoskeletal:     Right lower leg: No edema.     Left lower leg: No edema.  Lymphadenopathy:     Cervical: No cervical adenopathy.  Neurological:     Mental Status: She is alert and oriented to person, place, and time. Mental status is at baseline.     Gait: Gait is intact.  Psychiatric:        Mood and Affect: Mood and affect normal.        Speech: Speech normal.        Behavior: Behavior normal.        Judgment: Judgment normal.      No results found for any visits on 07/04/23.     Assessment & Plan:    Routine Health Maintenance and Physical Exam  Immunization History  Administered Date(s) Administered   Influenza Inj Mdck Quad Pf 07/16/2019   Influenza Split 07/03/2012   Influenza,inj,Quad PF,6+ Mos 07/20/2015, 07/02/2016, 06/20/2017, 07/29/2018, 05/27/2020, 06/19/2021, 05/14/2022   Influenza-Unspecified 07/22/2013, 05/31/2023   PFIZER(Purple Top)SARS-COV-2 Vaccination 12/31/2019, 01/25/2020, 11/01/2020, 06/23/2021   Td 09/25/1995   Tdap  10/30/2012, 03/13/2020   Zoster Recombinant(Shingrix) 07/12/2022, 09/12/2022    Health Maintenance  Topic Date Due   COVID-19 Vaccine (5 - 2023-24 season) 07/20/2023 (Originally 05/26/2023)   MAMMOGRAM  02/07/2024   Cervical Cancer Screening (HPV/Pap Cotest)  05/27/2025   Colonoscopy  10/20/2026   DTaP/Tdap/Td (4 - Td or Tdap) 03/13/2030   INFLUENZA VACCINE  Completed   Hepatitis C Screening  Completed   Zoster Vaccines- Shingrix  Completed   HIV Screening  Addressed  HPV VACCINES  Aged Out    Discussed health benefits of physical activity, and encouraged her to engage in regular exercise appropriate for her age and condition.  H/O: hypothyroidism -     TSH; Future  Lipid screening -     Lipid panel; Future  Routine general medical examination at a health care facility -     CBC with Differential/Platelet; Future -     Comprehensive metabolic panel; Future  Low vitamin D level -     VITAMIN D 25 Hydroxy (Vit-D Deficiency, Fractures); Future  Low serum vitamin B12 -     Vitamin B12; Future  Hair thinning -     Iron; Future -     Ferritin; Future  Normal physical exam findings, counseled patient on healthy eating and exercise, handouts given, labs ordered today for surveillance. RTC 1 year or PRN if needed.  Return in 1 year (on 07/03/2024).     Karie Georges, MD

## 2023-07-09 ENCOUNTER — Encounter: Payer: Self-pay | Admitting: Family Medicine

## 2023-07-14 ENCOUNTER — Other Ambulatory Visit: Payer: Self-pay | Admitting: Podiatry

## 2023-09-02 DIAGNOSIS — Z85820 Personal history of malignant melanoma of skin: Secondary | ICD-10-CM | POA: Diagnosis not present

## 2023-09-02 DIAGNOSIS — L650 Telogen effluvium: Secondary | ICD-10-CM | POA: Diagnosis not present

## 2023-09-02 DIAGNOSIS — Z85828 Personal history of other malignant neoplasm of skin: Secondary | ICD-10-CM | POA: Diagnosis not present

## 2023-09-03 DIAGNOSIS — Z1389 Encounter for screening for other disorder: Secondary | ICD-10-CM | POA: Diagnosis not present

## 2023-09-03 DIAGNOSIS — N951 Menopausal and female climacteric states: Secondary | ICD-10-CM | POA: Diagnosis not present

## 2023-09-03 DIAGNOSIS — Z1331 Encounter for screening for depression: Secondary | ICD-10-CM | POA: Diagnosis not present

## 2023-09-03 DIAGNOSIS — N924 Excessive bleeding in the premenopausal period: Secondary | ICD-10-CM | POA: Diagnosis not present

## 2023-09-03 DIAGNOSIS — D259 Leiomyoma of uterus, unspecified: Secondary | ICD-10-CM | POA: Diagnosis not present

## 2023-09-14 ENCOUNTER — Other Ambulatory Visit: Payer: Self-pay | Admitting: Family Medicine

## 2023-09-14 DIAGNOSIS — F339 Major depressive disorder, recurrent, unspecified: Secondary | ICD-10-CM

## 2023-09-24 ENCOUNTER — Other Ambulatory Visit: Payer: Self-pay | Admitting: Medical Genetics

## 2023-10-03 ENCOUNTER — Other Ambulatory Visit (HOSPITAL_COMMUNITY): Payer: BC Managed Care – PPO

## 2023-10-03 ENCOUNTER — Other Ambulatory Visit (HOSPITAL_COMMUNITY): Payer: Self-pay

## 2023-10-04 ENCOUNTER — Other Ambulatory Visit (HOSPITAL_COMMUNITY)
Admission: RE | Admit: 2023-10-04 | Discharge: 2023-10-04 | Disposition: A | Discharge status: Home / Self Care | Payer: Self-pay | Source: Ambulatory Visit | Attending: Medical Genetics | Admitting: Medical Genetics

## 2023-10-05 DIAGNOSIS — J09X2 Influenza due to identified novel influenza A virus with other respiratory manifestations: Secondary | ICD-10-CM | POA: Diagnosis not present

## 2023-10-14 LAB — GENECONNECT MOLECULAR SCREEN: Genetic Analysis Overall Interpretation: NEGATIVE

## 2023-10-17 ENCOUNTER — Ambulatory Visit: Payer: BC Managed Care – PPO | Admitting: Behavioral Health

## 2023-10-17 DIAGNOSIS — F339 Major depressive disorder, recurrent, unspecified: Secondary | ICD-10-CM

## 2023-10-17 DIAGNOSIS — F419 Anxiety disorder, unspecified: Secondary | ICD-10-CM | POA: Diagnosis not present

## 2023-10-17 NOTE — Progress Notes (Signed)
                Ashley Allen Ashley Linares, LMFT 

## 2023-10-18 NOTE — Progress Notes (Deleted)
[facility] Counselor/Therapist Progress Note  Patient ID: Ashley Allen, MRN: [id number],    Date: 10/17/2023  Time Spent: 55 min In Person @ [facility] Office Time In: 10:00am Time Out: 10  Treatment Type: {CHL AMB THERAPY TYPES:[id number]}  Reported Symptoms: ***  Mental Status Exam: Appearance:  {PSY:22683}     Behavior: {PSY:21022743}  Motor: {PSY:22302}  Speech/Language:  {PSY:22685}  Affect: {PSY:22687}  Mood: {PSY:31886}  Thought process: {PSY:31888}  Thought content:   {[phone]}  Sensory/Perceptual disturbances:   {PSY:([phone]}  Orientation: {PSY:30297}  Attention: {PSY:22877}  Concentration: {PSY:331 684 7871}  Memory: {PSY:340-383-1275}  Fund of knowledge:  {PSY:331 684 7871}  Insight:   {PSY:331 684 7871}  Judgment:  {PSY:331 684 7871}  Impulse Control: {PSY:331 684 7871}   Risk Assessment: Danger to Self:  {PSY:22692} Self-injurious Behavior: {PSY:22692} Danger to Others: {PSY:22692} Duty to Warn:{PSY:311194} Physical Aggression / Violence:{PSY:21197} Access to Firearms a concern: {PSY:21197} Gang Involvement:{PSY:21197}  Subjective: ***   Interventions: {[phone]}  Diagnosis:No diagnosis found.  Plan: ***  Deneise Lever, LMFT
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
## 2023-11-01 ENCOUNTER — Ambulatory Visit: Payer: BC Managed Care – PPO | Admitting: Behavioral Health

## 2023-11-08 ENCOUNTER — Ambulatory Visit: Payer: BC Managed Care – PPO | Admitting: Behavioral Health

## 2023-11-08 DIAGNOSIS — F339 Major depressive disorder, recurrent, unspecified: Secondary | ICD-10-CM

## 2023-11-08 DIAGNOSIS — F419 Anxiety disorder, unspecified: Secondary | ICD-10-CM

## 2023-11-08 NOTE — Progress Notes (Signed)
Deneise Lever, LMFT
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
## 2023-11-27 DIAGNOSIS — Z85820 Personal history of malignant melanoma of skin: Secondary | ICD-10-CM | POA: Diagnosis not present

## 2023-11-27 DIAGNOSIS — L650 Telogen effluvium: Secondary | ICD-10-CM | POA: Diagnosis not present

## 2023-11-27 DIAGNOSIS — Z85828 Personal history of other malignant neoplasm of skin: Secondary | ICD-10-CM | POA: Diagnosis not present

## 2023-11-28 ENCOUNTER — Ambulatory Visit: Payer: BC Managed Care – PPO | Admitting: Behavioral Health

## 2023-11-28 DIAGNOSIS — F419 Anxiety disorder, unspecified: Secondary | ICD-10-CM | POA: Diagnosis not present

## 2023-11-28 DIAGNOSIS — F339 Major depressive disorder, recurrent, unspecified: Secondary | ICD-10-CM

## 2023-11-28 NOTE — Progress Notes (Signed)
   Deneise Lever, LMFT

## 2023-11-28 NOTE — Progress Notes (Signed)
 [facility] Counselor/Therapist Progress Note  Patient ID: Ashley Allen, MRN: [id number],    Date: 11/28/2023  Time Spent: 55 min Caregility video; Pt is home in private & Provider is working remotely from [employer]. Pt is aware of risks/limitations of telehealth & consents to Tx today.  Time In: 2:00pm Time Out: 2:55pm   Treatment Type: Individual Therapy  Reported Symptoms: Elevated anx/dep & stress due to the current [place] Politics that are erratic & unpredictable.   Mental Status Exam: Appearance:  Casual     Behavior: Appropriate and Sharing  Motor: Normal  Speech/Language:  Clear and Coherent  Affect: Appropriate  Mood: sad  Thought process: normal  Thought content:   WNL  Sensory/Perceptual disturbances:   Normal  Orientation: oriented to person, place, time/date, and situation  Attention: Good  Concentration: Good  Memory: WNL  Fund of knowledge:  Good  Insight:   Good  Judgment:  Good  Impulse Control: Good   Risk Assessment: Danger to Self:  No Self-injurious Behavior: No Danger to Others: No Duty to Warn:no Physical Aggression / Violence:No  Access to Firearms a concern: No  Gang Involvement:No   Subjective: Pt is upset over the condition of the world today; Politics are very aversive & annoying to her. She is refraining from seeking out info about Politics.   Interventions: Family Systems, Interpersonal, and watch much less Political coverage on the TV & online  Diagnosis:Anxiety  Depression, recurrent (HCC)  Plan: Pt will cont to record her thoughts in her Notebook. It is helping her find perspective & direction.  Target Date: 12/22/2023   Progress: 7  Frequency: Once every 2-3 wks  Modality: Claretta Fraise, LMFT

## 2023-12-19 ENCOUNTER — Ambulatory Visit: Admitting: Behavioral Health

## 2023-12-19 DIAGNOSIS — F419 Anxiety disorder, unspecified: Secondary | ICD-10-CM

## 2023-12-19 NOTE — Progress Notes (Signed)
   Ashley Lever, LMFT

## 2023-12-19 NOTE — Progress Notes (Signed)
 Ashley Allen/Therapist Progress Note  Patient ID: Ashley Allen, MRN: [id number],    Date: 12/19/2023  Time Spent: 55 min Caregility video; Pt is in car w/privacy & Provider working remotely from [facility] [employer]. Pt is aware of the risks/limitations of telehealth & consents to Tx today.  Time In: 1:00pm Time Out: 1:55pm   Treatment Type: Individual Therapy  Reported Symptoms: Elevated anx/dep & stress due to BF's loss of weight & his lack of Healthcare maintenance w/his PCP.  Mental Status Exam: Appearance:  Casual     Behavior: Appropriate and Sharing  Motor: Normal  Speech/Language:  Clear and Coherent  Affect: Appropriate  Mood: normal  Thought process: normal  Thought content:   WNL  Sensory/Perceptual disturbances:   WNL  Orientation: oriented to person, place, time/date, and situation  Attention: Good  Concentration: Good  Memory: WNL  Fund of knowledge:  Good  Insight:   Good  Judgment:  Good  Impulse Control: Good   Risk Assessment: Danger to Self:  No Self-injurious Behavior: No Danger to Others: No Duty to Warn:no Physical Aggression / Violence:No  Access to Firearms a concern: No  Gang Involvement:No   Subjective: Pt is worried for her BF's health due to his lack of healthcare visits to his PCP. Pt has asked his Mother to speak about it. His 2 female cousins are trying to speak to him also.  There is a Family Hx of colon cancer, weight loss, & BHP.   Pt is pushing for Family to connect w/him about health issues. She is spending time thinking over his health situation since 2019 when he broke his tailbone.   Pt has 2 of BF's female cousins trying to speak w/him about his weight.   Interventions:  Relational focus on BF's healthcare  Diagnosis:Anxiety  Plan: [name] is concerned for her BF's health. In the past year he has lost 33#. She will ask him to join [place] in our session on 01/07/2024 for improved Communication skills. The  purpose of the visit is for her support & for Clinician to get to know her support system.   Target Date: 01/07/2024  Progress: 6  Frequency: Once every 3-4 wks  Modality: Claretta Fraise, LMFT

## 2023-12-26 ENCOUNTER — Telehealth: Payer: Self-pay

## 2023-12-26 DIAGNOSIS — Z01810 Encounter for preprocedural cardiovascular examination: Secondary | ICD-10-CM

## 2023-12-26 DIAGNOSIS — I77819 Aortic ectasia, unspecified site: Secondary | ICD-10-CM

## 2023-12-26 NOTE — Telephone Encounter (Signed)
-----   Message from [facility] sent at 04/02/2023  9:41 AM EDT ----- Regarding: 1 yr f/u with CT Chest and Aorta Need order placed for CT Aorta if not done already when it comes

## 2023-12-26 NOTE — Telephone Encounter (Signed)
 Called patient to let her know that Dr. Jenene Slicker ordered a CTA Chest Aorta to be done before she comes back for her one year follow up. Placed order and sent to scheduling as well.  Obtain CTA aorta in 1 year.  Schedule follow-up in 1 year.

## 2023-12-27 ENCOUNTER — Telehealth: Payer: Self-pay | Admitting: Internal Medicine

## 2023-12-27 NOTE — Telephone Encounter (Signed)
 Checking percert on the following patient for testing scheduled at [facility].    CT ANGIO CHEST AORTA W/CM & OR  02/25/2024

## 2024-01-07 ENCOUNTER — Ambulatory Visit: Admitting: Behavioral Health

## 2024-01-07 DIAGNOSIS — F419 Anxiety disorder, unspecified: Secondary | ICD-10-CM | POA: Diagnosis not present

## 2024-01-07 NOTE — Progress Notes (Addendum)
 [facility] Counselor/Therapist Progress Note  Patient ID: Ashley Allen, MRN: [id number],    Date: 01/07/2024  Time Spent: 50 min Caregility video; Pt is in her car in private & Provider is working remotely from [employer]. Time In: 1:00pm Time Out: 1:45pm   Treatment Type: Individual Therapy  Reported Symptoms: Elevated anx/dep due to Family & Sig Other stressors  Mental Status Exam: Appearance:  Casual     Behavior: Appropriate and Sharing  Motor: Normal  Speech/Language:  Clear and Coherent  Affect: Appropriate  Mood: normal  Thought process: normal  Thought content:   WNL  Sensory/Perceptual disturbances:   WNL  Orientation: oriented to person, place, time/date, and situation  Attention: Good  Concentration: Good  Memory: WNL  Fund of knowledge:  Good  Insight:   Good  Judgment:  Good  Impulse Control: Good   Risk Assessment: Danger to Self:  No Self-injurious Behavior: No Danger to Others: No Duty to Warn:no Physical Aggression / Violence:No  Access to Firearms a concern: No  Gang Involvement:No   Subjective: Pt is realizing she cannot do everything for her Sig Other. She has been more aware of not doing so much for her BF & letting him make the appt for his best health. She has provided more room/space for him to are for himself.  She is traveling this wknd for Easter to her [employer] on Easter. Her Mother will cook from 5:00am until 2:00pm that same day. Pt will go early to help her. Pt wants her Siblings to know the tradition will end w/her Mother's generation. It makes her sad to consider the probability of her Mother dying. Her Mother is a Agricultural engineer & thrives on this interest.   Pt & her BF Ashley Allen watch the News nightly & it raises her anxiety. She is learning to tolerate her anxiety & limit her sense of control over world events. The World News is disconcerting.   Interventions: Family Systems  Diagnosis:Anxiety  Plan: Ashley Allen will  travel on Sat to see Family for Sun/Easter Brunch. She lives in [place] & [place] the [place] environment. She is putting off work for her home due to materials & workers not available. Ashley Allen will navigate the Family Gathering realistically this wknd. She will keep her BF's autonomy in mind as she waits on him to make an appt for his Px.   Target Date: 01/23/2024  Progress: 8  Frequency: Once monthly or prn  Modality: Ashley Allen Fam, LMFT

## 2024-01-07 NOTE — Progress Notes (Signed)
   Ashley Lever, LMFT

## 2024-01-30 ENCOUNTER — Ambulatory Visit: Admitting: Behavioral Health

## 2024-01-30 DIAGNOSIS — F419 Anxiety disorder, unspecified: Secondary | ICD-10-CM | POA: Diagnosis not present

## 2024-01-30 DIAGNOSIS — F339 Major depressive disorder, recurrent, unspecified: Secondary | ICD-10-CM | POA: Diagnosis not present
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
## 2024-01-30 NOTE — Progress Notes (Signed)
   Deneise Lever, LMFT

## 2024-02-20 ENCOUNTER — Ambulatory Visit: Admitting: Behavioral Health

## 2024-02-20 DIAGNOSIS — F419 Anxiety disorder, unspecified: Secondary | ICD-10-CM | POA: Diagnosis not present

## 2024-02-20 DIAGNOSIS — F339 Major depressive disorder, recurrent, unspecified: Secondary | ICD-10-CM | POA: Diagnosis not present

## 2024-02-20 NOTE — Progress Notes (Addendum)
 [facility] Counselor/Therapist Progress Note  Patient ID: Ashley Allen, MRN: [id number],    Date: 02/20/2024  Time Spent: 50 min Caregility video; Pt is @ home in private & Provider working remotely from [employer]. Pt is aware of the risks/limitations of telehealth & consents to Tx today. Time In: 1:00pm  Time Out: 1:50pm  Treatment Type: Individual Therapy  Reported Symptoms: Decrease in anx/dep & stress  Mental Status Exam: Appearance:  Casual     Behavior: Appropriate and Sharing  Motor: Normal  Speech/Language:  Clear and Coherent  Affect: Appropriate  Mood: normal  Thought process: normal  Thought content:   WNL  Sensory/Perceptual disturbances:   WNL  Orientation: oriented to person, place, time/date, and situation  Attention: Good  Concentration: Good  Memory: WNL  Fund of knowledge:  Good  Insight:   Good  Judgment:  Good  Impulse Control: Good   Risk Assessment: Danger to Self:  No Self-injurious Behavior: No Danger to Others: No Duty to Warn:no Physical Aggression / Violence:No  Access to Firearms a concern: No  Gang Involvement:No   Subjective: Pt is examining the state of AI in the world. She is looking @ the chaos in the World & finds it anxiety-provoking.   Pt's BF Ashley Allen is facing a crisis w/his 18yo Son [address] He intends to move to [facility] & live w/the Parents of a girl he has met online. He will be doing this on 03/13/2024. Pt feels this is an issue. She hopes for the Son to make good decisions. This situation is causing heightened anxiety.   Interventions: Insight-Oriented  Diagnosis:Anxiety  Depression, recurrent (HCC)  Plan: Ashley Allen will cont to support her BF Ashley Allen in his approach to the situation w/their Son's decision to move to the [employer] [place] prior to Recruitment into [employer] in the [employer]. She will share w/Ashley Allen a question to assist him, How can I help you make your best decisions over the Summer.  Target  Date: 03/23/2024  Progress: 6  Frequency: Once every 2-3 wks  Modality: [name], LMFT

## 2024-02-20 NOTE — Progress Notes (Signed)
   Ashley Lever, LMFT

## 2024-02-25 ENCOUNTER — Ambulatory Visit (HOSPITAL_COMMUNITY)

## 2024-02-26 DIAGNOSIS — M25561 Pain in right knee: Secondary | ICD-10-CM | POA: Diagnosis not present

## 2024-02-26 DIAGNOSIS — M7631 Iliotibial band syndrome, right leg: Secondary | ICD-10-CM | POA: Diagnosis not present

## 2024-02-26 DIAGNOSIS — M25551 Pain in right hip: Secondary | ICD-10-CM | POA: Diagnosis not present

## 2024-03-06 DIAGNOSIS — M25551 Pain in right hip: Secondary | ICD-10-CM | POA: Diagnosis not present

## 2024-03-06 DIAGNOSIS — M25561 Pain in right knee: Secondary | ICD-10-CM | POA: Diagnosis not present

## 2024-03-06 DIAGNOSIS — M7631 Iliotibial band syndrome, right leg: Secondary | ICD-10-CM | POA: Diagnosis not present

## 2024-03-09 DIAGNOSIS — M25551 Pain in right hip: Secondary | ICD-10-CM | POA: Diagnosis not present

## 2024-03-09 DIAGNOSIS — M7631 Iliotibial band syndrome, right leg: Secondary | ICD-10-CM | POA: Diagnosis not present

## 2024-03-09 DIAGNOSIS — M25561 Pain in right knee: Secondary | ICD-10-CM | POA: Diagnosis not present

## 2024-03-11 DIAGNOSIS — M7631 Iliotibial band syndrome, right leg: Secondary | ICD-10-CM | POA: Diagnosis not present

## 2024-03-11 DIAGNOSIS — M25561 Pain in right knee: Secondary | ICD-10-CM | POA: Diagnosis not present

## 2024-03-11 DIAGNOSIS — M25551 Pain in right hip: Secondary | ICD-10-CM | POA: Diagnosis not present

## 2024-03-12 ENCOUNTER — Ambulatory Visit: Admitting: Behavioral Health

## 2024-03-12 DIAGNOSIS — F419 Anxiety disorder, unspecified: Secondary | ICD-10-CM

## 2024-03-12 NOTE — Progress Notes (Signed)
   Ashley Lever, LMFT

## 2024-03-12 NOTE — Progress Notes (Signed)
 [facility] Counselor/Therapist Progress Note  Patient ID: Ashley Allen, MRN: [id number],    Date: 03/12/2024  Time Spent: 40 min Caregility video; Pt is home in private & Provider working remotely from [employer]. Pt is aware of the risks/limitations of telehealth & consents to Tx today. Time In: 2:00pm Time Out: 2:40pm  Treatment Type: Individual Therapy  Reported Symptoms: Reduction in anx/dep & stress  Mental Status Exam: Appearance:  Casual     Behavior: Appropriate and Sharing  Motor: Normal  Speech/Language:  Clear and Coherent  Affect: Appropriate  Mood: normal  Thought process: normal  Thought content:   WNL  Sensory/Perceptual disturbances:   WNL  Orientation: oriented to person, place, time/date, and situation  Attention: Good  Concentration: Good  Memory: WNL  Fund of knowledge:  Good  Insight:   Good  Judgment:  Good  Impulse Control: Good   Risk Assessment: Danger to Self:  No Self-injurious Behavior: No Danger to Others: No Duty to Warn:no Physical Aggression / Violence:No  Access to Firearms a concern: No  Gang Involvement:No   Subjective: Pt is less worried for her BF's 79yo Son who is in Summer Sch for failing [place]. He cannot move to [facility] w/his GF as a result. This is a natural consequence of not doing his best in this class.   Work is very busy. She feels inundated by the workload.    Interventions: Insight-Oriented and Family Systems  Diagnosis:Anxiety  Plan: Ashley Allen will keep her current job due to the economy & workforce situation. Her anxiety levels have become more manageable. We will move out to monthly appts & check on progress @ that time.   Target Date: 04/22/2024  Progress: 8  Frequency: Monthly  Modality: Ashley Fam, LMFT

## 2024-03-14 ENCOUNTER — Other Ambulatory Visit: Payer: Self-pay | Admitting: Family Medicine

## 2024-03-14 DIAGNOSIS — F339 Major depressive disorder, recurrent, unspecified: Secondary | ICD-10-CM

## 2024-03-16 DIAGNOSIS — M25561 Pain in right knee: Secondary | ICD-10-CM | POA: Diagnosis not present

## 2024-03-16 DIAGNOSIS — M7631 Iliotibial band syndrome, right leg: Secondary | ICD-10-CM | POA: Diagnosis not present

## 2024-03-16 DIAGNOSIS — M25551 Pain in right hip: Secondary | ICD-10-CM | POA: Diagnosis not present

## 2024-03-18 DIAGNOSIS — M7631 Iliotibial band syndrome, right leg: Secondary | ICD-10-CM | POA: Diagnosis not present

## 2024-03-18 DIAGNOSIS — M25561 Pain in right knee: Secondary | ICD-10-CM | POA: Diagnosis not present

## 2024-03-18 DIAGNOSIS — M25551 Pain in right hip: Secondary | ICD-10-CM | POA: Diagnosis not present

## 2024-03-23 DIAGNOSIS — M7631 Iliotibial band syndrome, right leg: Secondary | ICD-10-CM | POA: Diagnosis not present

## 2024-03-23 DIAGNOSIS — M25561 Pain in right knee: Secondary | ICD-10-CM | POA: Diagnosis not present

## 2024-03-23 DIAGNOSIS — M25551 Pain in right hip: Secondary | ICD-10-CM | POA: Diagnosis not present

## 2024-03-24 DIAGNOSIS — R6882 Decreased libido: Secondary | ICD-10-CM | POA: Diagnosis not present

## 2024-03-24 DIAGNOSIS — N951 Menopausal and female climacteric states: Secondary | ICD-10-CM | POA: Diagnosis not present

## 2024-03-24 DIAGNOSIS — N939 Abnormal uterine and vaginal bleeding, unspecified: Secondary | ICD-10-CM | POA: Diagnosis not present

## 2024-04-06 ENCOUNTER — Ambulatory Visit: Attending: Internal Medicine | Admitting: Internal Medicine

## 2024-04-06 VITALS — BP 120/90 | HR 77 | Ht 65.0 in | Wt 166.0 lb

## 2024-04-06 DIAGNOSIS — I77810 Thoracic aortic ectasia: Secondary | ICD-10-CM | POA: Insufficient documentation

## 2024-04-06 DIAGNOSIS — I77819 Aortic ectasia, unspecified site: Secondary | ICD-10-CM

## 2024-04-06 DIAGNOSIS — Z8249 Family history of ischemic heart disease and other diseases of the circulatory system: Secondary | ICD-10-CM | POA: Diagnosis not present

## 2024-04-06 DIAGNOSIS — R931 Abnormal findings on diagnostic imaging of heart and coronary circulation: Secondary | ICD-10-CM | POA: Diagnosis not present

## 2024-04-06 NOTE — Patient Instructions (Addendum)
 Medication Instructions:   Continue all current medications.   Labwork:  none  Testing/Procedures:  CTA chest / aorta  Due 2 years   Follow-Up:  2 years   Any Other Special Instructions Will Be Listed Below (If Applicable).   If you need a refill on your cardiac medications before your next appointment, please call your pharmacy.

## 2024-04-06 NOTE — Progress Notes (Signed)
 Cardiology Office Note  Date: 04/06/2024   ID: Camelia Hoit, DOB [birth year], MRN [id number]  PCP:  Ozell Heron HERO, MD  Cardiologist:  None Electrophysiologist:  None   Reason for Office Visit: Family history of heart issues   History of Present Illness: Socorro Kanitz is a 53 y.o. female known to have mild OSA is here for follow-up visit.  Patient has a strong family history of sudden cardiac death, grandparents passed away with sudden cardiac death/heart attack.  Couple other family members also passed away in their 30s and 39s recently. Her mother had congestive heart failure.    CT cardiac showed coronary calcium score of 7, 89th percentile for age and sex matched control.  Mild ascending aortic dilatation, 40 mm was noted on the noncontrasted study.  Denies having any angina or DOE.  No dizziness, syncope, palpitations, leg swelling.  Past Medical History:  Diagnosis Date   Anxiety    Chicken pox    Depression    GERD (gastroesophageal reflux disease)    Hx of melanoma of skin 06/20/2017   Sees dermatologist   Recurrent upper respiratory infection (URI)    Sleep apnea    Thyroid  nodule    benign   Ulcer    UTI (urinary tract infection)     Past Surgical History:  Procedure Laterality Date   AUGMENTATION MAMMAPLASTY Bilateral 2023   BREAST ENHANCEMENT SURGERY  09/24/2005   NASAL SINUS SURGERY  ?2010    Current Outpatient Medications  Medication Sig Dispense Refill   buPROPion  (WELLBUTRIN  SR) 200 MG 12 hr tablet Take 200 mg by mouth 2 (two) times daily.     Cetirizine HCl (ZYRTEC PO) Take by mouth. Alternate with claritin     Cholecalciferol (VITAMIN D3) 25 MCG (1000 UT) CAPS Take 2,000 Units by mouth daily at 6 (six) AM.     fluticasone  (FLONASE  ALLERGY RELIEF) 50 MCG/ACT nasal spray Place 2 sprays into both nostrils daily.     gabapentin  (NEURONTIN ) 100 MG capsule Take 200 mg by mouth at bedtime as needed.     progesterone (PROMETRIUM) 100 MG capsule       valACYclovir  (VALTREX ) 500 MG tablet TAKE ONE TABLET BY MOUTH TWICE DAILY X 3 DAYS ONSET SYMPTOMS 30 tablet 0   meloxicam  (MOBIC ) 15 MG tablet Take 1 tablet (15 mg total) by mouth daily. As needed 30 tablet 0   No current facility-administered medications for this visit.   Allergies:  Sulfa antibiotics, Bacitracin-polymyxin b, Sulfonamide derivatives, and Cyclobenzaprine   Social History: The patient  reports that she quit smoking about 14 years ago. Her smoking use included cigarettes. She started smoking about 17 years ago. She has never used smokeless tobacco. She reports current alcohol  use. She reports that she does not use drugs.   Family History: The patient's family history includes Alcohol  abuse in her brother and father; Arthritis in her father and mother; Cancer in an other family member; Cervical cancer in her paternal aunt; Colon cancer in her paternal aunt and another family member; Emphysema in her father; Heart attack in her paternal grandmother; Heart attack (age of onset: 86) in her maternal grandfather; Heart disease in her father; High Cholesterol in her sister and sister; High blood pressure in her brother, brother, brother, sister, and sister; Hyperlipidemia in her mother; Hypertension in her father and mother; Liver cancer in her maternal aunt; Obesity in her brother; Stomach cancer in her maternal aunt; Stroke (age of onset: 51) in her paternal uncle  and paternal uncle; Stroke (age of onset: 67) in her maternal grandmother; Uterine cancer in an other family member.   ROS:  Please see the history of present illness. Otherwise, complete review of systems is positive for none  All other systems are reviewed and negative.   Physical Exam: VS:  BP (!) 120/90 (BP Location: Right Arm, Patient Position: Sitting)   Pulse 77   Ht 5' 5 (1.651 m)   Wt 166 lb (75.3 kg)   SpO2 97%   BMI 27.62 kg/m , BMI Body mass index is 27.62 kg/m.  Wt Readings from Last 3 Encounters:   04/06/24 166 lb (75.3 kg)  07/04/23 166 lb 4.8 oz (75.4 kg)  03/14/23 166 lb 3.2 oz (75.4 kg)    General: Patient appears comfortable at rest. HEENT: Conjunctiva and lids normal, oropharynx clear with moist mucosa. Neck: Supple, no elevated JVP or carotid bruits, no thyromegaly. Lungs: Clear to auscultation, nonlabored breathing at rest. Cardiac: Regular rate and rhythm, no S3 or significant systolic murmur, no pericardial rub. Abdomen: Soft, nontender, no hepatomegaly, bowel sounds present, no guarding or rebound. Extremities: No pitting edema, distal pulses 2+. Skin: Warm and dry. Musculoskeletal: No kyphosis. Neuropsychiatric: Alert and oriented x3, affect grossly appropriate.  Recent Labwork: 07/04/2023: ALT 16; AST 17; BUN 9; Creatinine, Ser 0.70; Hemoglobin 12.0; Platelets 269.0; Potassium 3.9; Sodium 139; TSH 1.30     Component Value Date/Time   CHOL 192 07/04/2023 0957   TRIG 86.0 07/04/2023 0957   HDL 53.40 07/04/2023 0957   CHOLHDL 4 07/04/2023 0957   VLDL 17.2 07/04/2023 0957   LDLCALC 121 (H) 07/04/2023 0957   LDLCALC 95 05/27/2020 1101   LDLDIRECT 118.0 07/02/2022 0838    Assessment and Plan:  Elevated coronary calcium score of 7 (89th percentile for age and sex matched controls): No negation to be on aspirin, start rosuvastatin 5 mg 3 times a week.  Goal LDL less than 100.  Heart healthy diet and exercise recommendations provided.    Ascending aorta dilatation, 40 mm in 2024: She had to pay out-of-pocket for CTA study in 2024.  Will repeat CTA chest/aorta in 2 years.  Mild OSA: Was told that she needs oral appliance and does not need CPAP.  HLD and family history of sudden cardiac death/CAD: Laboratory a levels were ordered last year but this was expired.  Repeat lipoprotein a levels.  Medication Adjustments/Labs and Tests Ordered: Current medicines are reviewed at length with the patient today.  Concerns regarding medicines are outlined above.   Tests  Ordered: Orders Placed This Encounter  Procedures   EKG 12-Lead    Medication Changes: No orders of the defined types were placed in this encounter.   Disposition:  Follow up 2 years  Signed Shaneen Reeser Priya Clemie General, MD, 04/06/2024 11:34 AM    [facility] at [facility] [address] [place], [place], [place] [address]

## 2024-04-07 ENCOUNTER — Encounter: Payer: Self-pay | Admitting: *Deleted

## 2024-04-07 DIAGNOSIS — R5383 Other fatigue: Secondary | ICD-10-CM | POA: Diagnosis not present

## 2024-04-07 DIAGNOSIS — E611 Iron deficiency: Secondary | ICD-10-CM | POA: Diagnosis not present

## 2024-04-07 DIAGNOSIS — E559 Vitamin D deficiency, unspecified: Secondary | ICD-10-CM | POA: Diagnosis not present

## 2024-04-07 DIAGNOSIS — F419 Anxiety disorder, unspecified: Secondary | ICD-10-CM | POA: Diagnosis not present

## 2024-04-07 DIAGNOSIS — Z131 Encounter for screening for diabetes mellitus: Secondary | ICD-10-CM | POA: Diagnosis not present

## 2024-04-07 DIAGNOSIS — E039 Hypothyroidism, unspecified: Secondary | ICD-10-CM | POA: Diagnosis not present

## 2024-04-07 DIAGNOSIS — L659 Nonscarring hair loss, unspecified: Secondary | ICD-10-CM | POA: Diagnosis not present

## 2024-04-07 DIAGNOSIS — Z1321 Encounter for screening for nutritional disorder: Secondary | ICD-10-CM | POA: Diagnosis not present

## 2024-04-07 DIAGNOSIS — N959 Unspecified menopausal and perimenopausal disorder: Secondary | ICD-10-CM | POA: Diagnosis not present

## 2024-04-08 DIAGNOSIS — L650 Telogen effluvium: Secondary | ICD-10-CM | POA: Diagnosis not present

## 2024-04-08 DIAGNOSIS — Z85828 Personal history of other malignant neoplasm of skin: Secondary | ICD-10-CM | POA: Diagnosis not present

## 2024-04-08 DIAGNOSIS — D2262 Melanocytic nevi of left upper limb, including shoulder: Secondary | ICD-10-CM | POA: Diagnosis not present

## 2024-04-08 DIAGNOSIS — Z85820 Personal history of malignant melanoma of skin: Secondary | ICD-10-CM | POA: Diagnosis not present

## 2024-04-10 ENCOUNTER — Ambulatory Visit: Admitting: Behavioral Health

## 2024-04-10 DIAGNOSIS — F419 Anxiety disorder, unspecified: Secondary | ICD-10-CM | POA: Diagnosis not present

## 2024-04-10 DIAGNOSIS — F339 Major depressive disorder, recurrent, unspecified: Secondary | ICD-10-CM

## 2024-04-10 DIAGNOSIS — F32A Depression, unspecified: Secondary | ICD-10-CM

## 2024-04-10 NOTE — Progress Notes (Signed)
 [facility] Counselor/Therapist Progress Note  Patient ID: Ashley Allen, MRN: [id number],    Date: 04/10/2024  Time Spent: 40 min Caregility video; Pt is home in private & Provider working remotely from [employer]. Pt is aware of the risks/limitations of telehealth & consents to Tx today. Time In: 1:00pm Time Out: 1:40pm   Treatment Type: Individual Therapy  Reported Symptoms: Reduction in anx/dep & stress  Mental Status Exam: Appearance:  Casual     Behavior: Appropriate and Sharing  Motor: Normal  Speech/Language:  Clear and Coherent  Affect: Appropriate  Mood: normal  Thought process: normal  Thought content:   WNL  Sensory/Perceptual disturbances:   WNL  Orientation: oriented to person, place, time/date, and situation  Attention: Good  Concentration: Good  Memory: WNL  Fund of knowledge:  Good  Insight:   Good  Judgment:  Good  Impulse Control: Good   Risk Assessment: Danger to Self:  No Self-injurious Behavior: No Danger to Others: No Duty to Warn:no Physical Aggression / Violence:No  Access to Firearms a concern: No  Gang Involvement:No   Subjective: Pt is concerned for the world situation & the changes that are happening in the current Administration. Today she is less concerned for her BF's 18yo Son Ashley Allen bc he has not flown to [facility] to live w/the girl he is joining [employer] w/until Jan 2026. He has graduated H Sch successfully.   Interventions: Psycho-education/Bibliotherapy and Insight-Oriented  Diagnosis:Anxiety  Depression, recurrent (HCC)  Plan: For our 2nd session in a row, Pt reports feeling upbeat & fairly positive about her state of emot'l health. Her Menopause Specialist from [facility]; Leeroy Notice, MD has changed her medications & added addt'l hormonal Tx that has been helping. Pt is aware of the ability she has to contact the [facility] to make an appt anytime in the future when indicated. We will terminate today w/this  understanding.  Target Date: None needed @ this time  Progress: 9   Frequency: At Pt's future discretion  Modality: Kennis Richerd LITTIE Hollace, LMFT

## 2024-04-14 DIAGNOSIS — N959 Unspecified menopausal and perimenopausal disorder: Secondary | ICD-10-CM | POA: Diagnosis not present

## 2024-04-14 DIAGNOSIS — L659 Nonscarring hair loss, unspecified: Secondary | ICD-10-CM | POA: Diagnosis not present

## 2024-04-14 DIAGNOSIS — R5383 Other fatigue: Secondary | ICD-10-CM | POA: Diagnosis not present

## 2024-04-14 DIAGNOSIS — E785 Hyperlipidemia, unspecified: Secondary | ICD-10-CM | POA: Diagnosis not present

## 2024-04-14 DIAGNOSIS — Z131 Encounter for screening for diabetes mellitus: Secondary | ICD-10-CM | POA: Diagnosis not present

## 2024-04-14 DIAGNOSIS — F419 Anxiety disorder, unspecified: Secondary | ICD-10-CM | POA: Diagnosis not present

## 2024-05-01 DIAGNOSIS — B009 Herpesviral infection, unspecified: Secondary | ICD-10-CM | POA: Diagnosis not present

## 2024-05-13 DIAGNOSIS — R5383 Other fatigue: Secondary | ICD-10-CM | POA: Diagnosis not present

## 2024-05-13 DIAGNOSIS — E7800 Pure hypercholesterolemia, unspecified: Secondary | ICD-10-CM | POA: Diagnosis not present

## 2024-05-13 DIAGNOSIS — N959 Unspecified menopausal and perimenopausal disorder: Secondary | ICD-10-CM | POA: Diagnosis not present

## 2024-05-13 DIAGNOSIS — E039 Hypothyroidism, unspecified: Secondary | ICD-10-CM | POA: Diagnosis not present

## 2024-05-26 DIAGNOSIS — H5203 Hypermetropia, bilateral: Secondary | ICD-10-CM | POA: Diagnosis not present

## 2024-05-26 DIAGNOSIS — H5052 Exophoria: Secondary | ICD-10-CM | POA: Diagnosis not present

## 2024-05-28 DIAGNOSIS — N921 Excessive and frequent menstruation with irregular cycle: Secondary | ICD-10-CM | POA: Diagnosis not present

## 2024-09-28 ENCOUNTER — Ambulatory Visit (INDEPENDENT_AMBULATORY_CARE_PROVIDER_SITE_OTHER): Admitting: Behavioral Health

## 2024-09-28 DIAGNOSIS — F339 Major depressive disorder, recurrent, unspecified: Secondary | ICD-10-CM | POA: Diagnosis not present

## 2024-09-28 DIAGNOSIS — F419 Anxiety disorder, unspecified: Secondary | ICD-10-CM | POA: Diagnosis not present

## 2024-09-28 NOTE — Progress Notes (Signed)
"    [facility] Counselor/Therapist Progress Note  Patient ID: Ashley Allen, MRN: [id number],    Date: 09/28/2024  Time Spent: 50 min Caregility video; Pt is home in private & Provider working remotely from [employer]. Pt is aware of the risks/limitations of telehealth & consents to Tx today. Time In: 1;00pm Time Out: 1:50pm   Treatment Type: Individual Therapy  Reported Symptoms: Elevated anx/dep & relationship distress  Mental Status Exam: Appearance:  Casual     Behavior: Appropriate and Sharing  Motor: Normal  Speech/Language:  Clear and Coherent  Affect: Appropriate  Mood: normal  Thought process: normal  Thought content:   WNL  Sensory/Perceptual disturbances:   WNL  Orientation: oriented to person, place, time/date, and situation  Attention: Good  Concentration: Good  Memory: WNL  Fund of knowledge:  Good  Insight:   Good  Judgment:  Good  Impulse Control: Good   Risk Assessment: Danger to Self:  No Self-injurious Behavior: No Danger to Others: No Duty to Warn:no Physical Aggression / Violence:No  Access to Firearms a concern: No  Gang Involvement:No   Subjective: Pt is concerned for the state of her relationship w/her Sig Other of 19+ yrs this Oct 2026. She needs to schedule a Cpl Th session to work through the concerns she has for the Partnership.  Interventions: Grief Therapy and Family Systems  Diagnosis:Anxiety  Depression, recurrent  Plan: Ashley Allen & I have discussed her concerns & scheduled a Cpl Th session for later this month. She is confident her Partner will attend so they can address his beh & lack of dec-mkg about his health issues.   Target Date:10/23/2024  Progress: 5  Frequency: Once every 2-3 wks  Modality: Kennis Richerd LITTIE Hollace, LMFT    "

## 2024-10-14 ENCOUNTER — Ambulatory Visit: Admitting: Behavioral Health

## 2024-11-04 ENCOUNTER — Ambulatory Visit: Admitting: Behavioral Health
